# Patient Record
Sex: Female | Born: 1950 | Race: Black or African American | Hispanic: No | Marital: Single | Smoking: Current every day smoker
Health system: Southern US, Community
[De-identification: ages and names within clinical notes are randomized; demographics above are authoritative.]

## PROBLEM LIST (undated history)

## (undated) DIAGNOSIS — F32A Depression, unspecified: Secondary | ICD-10-CM

## (undated) DIAGNOSIS — M545 Low back pain, unspecified: Secondary | ICD-10-CM

## (undated) DIAGNOSIS — F319 Bipolar disorder, unspecified: Secondary | ICD-10-CM

## (undated) DIAGNOSIS — H269 Unspecified cataract: Secondary | ICD-10-CM

## (undated) DIAGNOSIS — G4730 Sleep apnea, unspecified: Secondary | ICD-10-CM

## (undated) DIAGNOSIS — J189 Pneumonia, unspecified organism: Secondary | ICD-10-CM

## (undated) DIAGNOSIS — F329 Major depressive disorder, single episode, unspecified: Secondary | ICD-10-CM

## (undated) DIAGNOSIS — M810 Age-related osteoporosis without current pathological fracture: Secondary | ICD-10-CM

## (undated) DIAGNOSIS — J45909 Unspecified asthma, uncomplicated: Secondary | ICD-10-CM

## (undated) DIAGNOSIS — J449 Chronic obstructive pulmonary disease, unspecified: Secondary | ICD-10-CM

## (undated) DIAGNOSIS — E7800 Pure hypercholesterolemia, unspecified: Secondary | ICD-10-CM

## (undated) DIAGNOSIS — F419 Anxiety disorder, unspecified: Secondary | ICD-10-CM

## (undated) DIAGNOSIS — G4733 Obstructive sleep apnea (adult) (pediatric): Secondary | ICD-10-CM

## (undated) DIAGNOSIS — G8929 Other chronic pain: Secondary | ICD-10-CM

## (undated) DIAGNOSIS — T7840XA Allergy, unspecified, initial encounter: Secondary | ICD-10-CM

## (undated) DIAGNOSIS — M1990 Unspecified osteoarthritis, unspecified site: Secondary | ICD-10-CM

## (undated) DIAGNOSIS — K5909 Other constipation: Secondary | ICD-10-CM

## (undated) DIAGNOSIS — E119 Type 2 diabetes mellitus without complications: Secondary | ICD-10-CM

## (undated) DIAGNOSIS — K589 Irritable bowel syndrome without diarrhea: Secondary | ICD-10-CM

## (undated) HISTORY — DX: Major depressive disorder, single episode, unspecified: F32.9

## (undated) HISTORY — PX: COLONOSCOPY: SHX174

## (undated) HISTORY — PX: BACK SURGERY: SHX140

## (undated) HISTORY — PX: CARPAL TUNNEL RELEASE: SHX101

## (undated) HISTORY — DX: Age-related osteoporosis without current pathological fracture: M81.0

## (undated) HISTORY — DX: Sleep apnea, unspecified: G47.30

## (undated) HISTORY — DX: Depression, unspecified: F32.A

## (undated) HISTORY — DX: Irritable bowel syndrome, unspecified: K58.9

## (undated) HISTORY — DX: Unspecified asthma, uncomplicated: J45.909

## (undated) HISTORY — DX: Unspecified cataract: H26.9

## (undated) HISTORY — PX: CHOLECYSTECTOMY: SHX55

## (undated) HISTORY — DX: Allergy, unspecified, initial encounter: T78.40XA

## (undated) HISTORY — DX: Other constipation: K59.09

## (undated) HISTORY — PX: POSTERIOR LUMBAR FUSION: SHX6036

---

## 1976-04-29 HISTORY — PX: TUBAL LIGATION: SHX77

## 1980-04-29 HISTORY — PX: ABDOMINAL HYSTERECTOMY: SHX81

## 1988-12-28 HISTORY — PX: CHOLECYSTECTOMY: SHX55

## 1997-04-29 HISTORY — PX: FOREARM FRACTURE SURGERY: SHX649

## 1998-04-29 HISTORY — PX: SHOULDER OPEN ROTATOR CUFF REPAIR: SHX2407

## 1998-12-29 HISTORY — PX: POSTERIOR LUMBAR FUSION: SHX6036

## 2011-04-30 HISTORY — PX: VAGAL NERVE STIMULATOR REMOVAL: SUR1110

## 2011-04-30 HISTORY — PX: IMPLANTATION VAGAL NERVE STIMULATOR: SUR692

## 2012-03-29 ENCOUNTER — Emergency Department (HOSPITAL_COMMUNITY): Payer: Medicare Other

## 2012-03-29 ENCOUNTER — Encounter (HOSPITAL_COMMUNITY): Payer: Self-pay | Admitting: *Deleted

## 2012-03-29 ENCOUNTER — Emergency Department (HOSPITAL_COMMUNITY)
Admission: EM | Admit: 2012-03-29 | Discharge: 2012-03-29 | Disposition: A | Discharge status: Home / Self Care | Payer: Medicare Other | Attending: Emergency Medicine | Admitting: Emergency Medicine

## 2012-03-29 DIAGNOSIS — F17200 Nicotine dependence, unspecified, uncomplicated: Secondary | ICD-10-CM | POA: Insufficient documentation

## 2012-03-29 DIAGNOSIS — Z79899 Other long term (current) drug therapy: Secondary | ICD-10-CM | POA: Insufficient documentation

## 2012-03-29 DIAGNOSIS — J40 Bronchitis, not specified as acute or chronic: Secondary | ICD-10-CM | POA: Insufficient documentation

## 2012-03-29 DIAGNOSIS — R059 Cough, unspecified: Secondary | ICD-10-CM | POA: Insufficient documentation

## 2012-03-29 DIAGNOSIS — R05 Cough: Secondary | ICD-10-CM | POA: Insufficient documentation

## 2012-03-29 DIAGNOSIS — E119 Type 2 diabetes mellitus without complications: Secondary | ICD-10-CM | POA: Insufficient documentation

## 2012-03-29 DIAGNOSIS — R5383 Other fatigue: Secondary | ICD-10-CM | POA: Insufficient documentation

## 2012-03-29 DIAGNOSIS — R5381 Other malaise: Secondary | ICD-10-CM | POA: Insufficient documentation

## 2012-03-29 DIAGNOSIS — Z794 Long term (current) use of insulin: Secondary | ICD-10-CM | POA: Insufficient documentation

## 2012-03-29 MED ORDER — ALBUTEROL SULFATE HFA 108 (90 BASE) MCG/ACT IN AERS
2.0000 | INHALATION_SPRAY | RESPIRATORY_TRACT | Status: DC | PRN
  Administered 2012-03-29: 2 via RESPIRATORY_TRACT
  Filled 2012-03-29: qty 6.7

## 2012-03-29 NOTE — ED Provider Notes (Signed)
History   This chart was scribed for Ethelda Chick, MD by Melba Coon, ED Scribe. The patient was seen in room TR11C/TR11C and the patient's care was started at 6:18PM.    CSN: [id number]  Arrival date & time 03/29/12  1619   None     Chief Complaint  Patient presents with  . URI    (Consider location/radiation/quality/duration/timing/severity/associated sxs/prior treatment) Patient is a 61 y.o. female presenting with URI. The history is provided by the patient. No language interpreter was used.  URI The primary symptoms include fatigue and cough. Primary symptoms do not include fever. Primary symptoms comment: sneezing This is a new problem. The problem has been gradually worsening.  Symptoms associated with the illness include congestion.  She reports that family relatives were sick at home in the past week with similar symptoms. Decreased fluid intake compared to baseline. She reports periorbital pain and mouth pain; "hurts around my gums". He denies neck pain, sore throat, rash, back pain, CP, SOB, abdominal pain, nausea, emesis, diarrhea, dysuria, or extremity pain, edema, weakness, numbness, or tingling. No other pertinent medical symptoms. She is a current everyday smoker.   Past Medical History  Diagnosis Date  . Diabetes mellitus without complication     Past Surgical History  Procedure Date  . Back surgery     History reviewed. No pertinent family history.  History  Substance Use Topics  . Smoking status: Current Every Day Smoker    Types: Cigarettes  . Smokeless tobacco: Not on file  . Alcohol Use: No    OB History    Grav Para Term Preterm Abortions TAB SAB Ect Mult Living                  Review of Systems  Constitutional: Positive for fatigue. Negative for fever.  HENT: Positive for congestion.   Respiratory: Positive for cough.    10 Systems reviewed and all are negative for acute change except as noted in the HPI.   Allergies   Latex  Home Medications   Current Outpatient Rx  Name  Route  Sig  Dispense  Refill  . ATORVASTATIN CALCIUM 40 MG PO TABS   Oral   Take 40 mg by mouth daily.         . FUROSEMIDE 20 MG PO TABS   Oral   Take 20 mg by mouth daily.         Marland Kitchen GABAPENTIN 300 MG PO CAPS   Oral   Take 300 mg by mouth 2 (two) times daily.         . INSULIN GLARGINE 100 UNIT/ML [place] SOLN   Subcutaneous   Inject 12 Units into the skin at bedtime.         Marland Kitchen LAMOTRIGINE 150 MG PO TABS   Oral   Take 150 mg by mouth 2 (two) times daily.         Marland Kitchen MECLIZINE HCL 12.5 MG PO TABS   Oral   Take 12.5 mg by mouth 3 (three) times daily as needed. For dizziness         . MORPHINE SULFATE 30 MG PO TABS   Oral   Take 30 mg by mouth every 12 (twelve) hours.         Marland Kitchen POTASSIUM CHLORIDE CRYS ER 20 MEQ PO TBCR   Oral   Take 20 mEq by mouth daily.           BP 112/71  Pulse 88  Temp 98.1 F (36.7 C) (Oral)  Resp 20  SpO2 97%  Physical Exam  Nursing note and vitals reviewed. Constitutional:       Awake, alert, nontoxic appearance. She is physically shivering with chills.  HENT:  Head: Atraumatic.       Nasal congestion and rhinnorhea.  Eyes: EOM are normal. Pupils are equal, round, and reactive to light. Right eye exhibits no discharge. Left eye exhibits no discharge.       Mild conjunctivitis bilaterally.  Neck: Normal range of motion. Neck supple.  Cardiovascular: Normal rate, regular rhythm and normal heart sounds.   No murmur heard. Pulmonary/Chest: Effort normal and breath sounds normal. She exhibits no tenderness.       Frequent coughing, but lung sounds are clear.  Abdominal: Soft. There is no tenderness. There is no rebound.  Musculoskeletal: She exhibits no tenderness.       Baseline ROM, no obvious new focal weakness.  Neurological:       Mental status and motor strength appears baseline for patient and situation.  Skin: Skin is warm. No rash noted.  Psychiatric: She has  a normal mood and affect.    ED Course  Procedures (including critical care time)  COORDINATION OF CARE:  6:21PM - CXR and albuterol will be ordered for Melanie Reynolds.    Labs Reviewed - No data to display Dg Chest 2 View  03/29/2012  *RADIOLOGY REPORT*  Clinical Data: Shortness of breath, chest pain, and cough  CHEST - 2 VIEW  Comparison: None.  Findings: Heart, mediastinal, and hilar contours are normal. Pulmonary vascularity is normal.  There is slight peribronchial thickening bilaterally.  No airspace opacities, effusion, or pneumothorax.  Trachea is midline.  Bones are unremarkable.  IMPRESSION: Mild peribronchial thickening.  This can be seen in the setting of bronchitis, smoking, or asthma.   Original Report Authenticated By: Britta Mccreedy, M.D.      1. Bronchitis       MDM  Pt presenting with c/o nasal congestion, cough and sneezing.  Symptoms have been present over the past 2 days.  CXR shows no pneumonia- signs most c/w bronchitis.  Pt given albuterol inhaler for bronchitis.  Discharged with strict return precautions.  Pt agreeable with plan.  I personally performed the services described in this documentation, which was scribed in my presence. The recorded information has been reviewed and is accurate.        Ethelda Chick, MD 03/29/12 [phone]

## 2012-03-29 NOTE — ED Notes (Signed)
Pt reports cold symptoms since yesterday, having cough, congestion, sneezing. Airway intact, no distress noted.

## 2012-11-09 ENCOUNTER — Other Ambulatory Visit (HOSPITAL_COMMUNITY): Payer: Self-pay | Admitting: Internal Medicine

## 2012-11-09 DIAGNOSIS — Z1231 Encounter for screening mammogram for malignant neoplasm of breast: Secondary | ICD-10-CM

## 2012-11-20 ENCOUNTER — Ambulatory Visit (HOSPITAL_COMMUNITY): Payer: Medicare Other

## 2012-12-03 ENCOUNTER — Ambulatory Visit (HOSPITAL_COMMUNITY)
Admission: RE | Admit: 2012-12-03 | Discharge: 2012-12-03 | Disposition: A | Discharge status: Home / Self Care | Payer: Medicare Other | Source: Ambulatory Visit | Attending: Internal Medicine | Admitting: Internal Medicine

## 2012-12-03 DIAGNOSIS — Z1231 Encounter for screening mammogram for malignant neoplasm of breast: Secondary | ICD-10-CM | POA: Insufficient documentation

## 2013-03-05 ENCOUNTER — Ambulatory Visit (HOSPITAL_COMMUNITY)
Admission: RE | Admit: 2013-03-05 | Discharge: 2013-03-05 | Disposition: A | Discharge status: Home / Self Care | Payer: Medicare Other | Source: Ambulatory Visit | Attending: Internal Medicine | Admitting: Internal Medicine

## 2013-03-05 ENCOUNTER — Other Ambulatory Visit (HOSPITAL_COMMUNITY): Payer: Self-pay | Admitting: Internal Medicine

## 2013-03-05 DIAGNOSIS — J209 Acute bronchitis, unspecified: Secondary | ICD-10-CM

## 2013-03-05 DIAGNOSIS — R05 Cough: Secondary | ICD-10-CM | POA: Insufficient documentation

## 2013-03-05 DIAGNOSIS — R059 Cough, unspecified: Secondary | ICD-10-CM | POA: Insufficient documentation

## 2013-06-01 ENCOUNTER — Other Ambulatory Visit: Payer: Self-pay

## 2013-06-01 ENCOUNTER — Inpatient Hospital Stay (HOSPITAL_COMMUNITY)
Admission: EM | Admit: 2013-06-01 | Discharge: 2013-06-03 | DRG: 194 | Disposition: A | Discharge status: Home / Self Care | Payer: Medicare Other | Attending: Internal Medicine | Admitting: Internal Medicine

## 2013-06-01 ENCOUNTER — Encounter (HOSPITAL_COMMUNITY): Payer: Self-pay | Admitting: Emergency Medicine

## 2013-06-01 ENCOUNTER — Emergency Department (HOSPITAL_COMMUNITY): Payer: Medicare Other

## 2013-06-01 DIAGNOSIS — E119 Type 2 diabetes mellitus without complications: Secondary | ICD-10-CM

## 2013-06-01 DIAGNOSIS — R0789 Other chest pain: Secondary | ICD-10-CM | POA: Diagnosis present

## 2013-06-01 DIAGNOSIS — J189 Pneumonia, unspecified organism: Principal | ICD-10-CM | POA: Diagnosis present

## 2013-06-01 DIAGNOSIS — E785 Hyperlipidemia, unspecified: Secondary | ICD-10-CM | POA: Diagnosis present

## 2013-06-01 DIAGNOSIS — F319 Bipolar disorder, unspecified: Secondary | ICD-10-CM

## 2013-06-01 DIAGNOSIS — F17200 Nicotine dependence, unspecified, uncomplicated: Secondary | ICD-10-CM | POA: Diagnosis present

## 2013-06-01 DIAGNOSIS — I959 Hypotension, unspecified: Secondary | ICD-10-CM

## 2013-06-01 DIAGNOSIS — J9819 Other pulmonary collapse: Secondary | ICD-10-CM | POA: Diagnosis present

## 2013-06-01 DIAGNOSIS — IMO0001 Reserved for inherently not codable concepts without codable children: Secondary | ICD-10-CM | POA: Diagnosis present

## 2013-06-01 DIAGNOSIS — F1210 Cannabis abuse, uncomplicated: Secondary | ICD-10-CM | POA: Diagnosis present

## 2013-06-01 DIAGNOSIS — D72829 Elevated white blood cell count, unspecified: Secondary | ICD-10-CM

## 2013-06-01 DIAGNOSIS — R51 Headache: Secondary | ICD-10-CM | POA: Diagnosis present

## 2013-06-01 DIAGNOSIS — G8929 Other chronic pain: Secondary | ICD-10-CM

## 2013-06-01 DIAGNOSIS — E876 Hypokalemia: Secondary | ICD-10-CM

## 2013-06-01 HISTORY — DX: Low back pain, unspecified: M54.50

## 2013-06-01 HISTORY — DX: Low back pain: M54.5

## 2013-06-01 HISTORY — DX: Obstructive sleep apnea (adult) (pediatric): G47.33

## 2013-06-01 HISTORY — DX: Type 2 diabetes mellitus without complications: E11.9

## 2013-06-01 HISTORY — DX: Pneumonia, unspecified organism: J18.9

## 2013-06-01 HISTORY — DX: Pure hypercholesterolemia, unspecified: E78.00

## 2013-06-01 HISTORY — DX: Anxiety disorder, unspecified: F41.9

## 2013-06-01 HISTORY — DX: Bipolar disorder, unspecified: F31.9

## 2013-06-01 HISTORY — DX: Other chronic pain: G89.29

## 2013-06-01 HISTORY — DX: Unspecified osteoarthritis, unspecified site: M19.90

## 2013-06-01 LAB — HIV ANTIBODY (ROUTINE TESTING W REFLEX): HIV: NONREACTIVE

## 2013-06-01 LAB — GLUCOSE, CAPILLARY
Glucose-Capillary: 91 mg/dL (ref 70–99)
Glucose-Capillary: 115 mg/dL — ABNORMAL HIGH (ref 70–99)
Glucose-Capillary: 114 mg/dL — ABNORMAL HIGH (ref 70–99)

## 2013-06-01 LAB — CBC
HCT: 43.2 % (ref 36.0–46.0)
HEMATOCRIT: 38.3 % (ref 36.0–46.0)
HEMOGLOBIN: 12.4 g/dL (ref 12.0–15.0)
Hemoglobin: 14.5 g/dL (ref 12.0–15.0)
MCH: 29.8 pg (ref 26.0–34.0)
MCH: 28.6 pg (ref 26.0–34.0)
MCHC: 33.6 g/dL (ref 30.0–36.0)
MCHC: 32.4 g/dL (ref 30.0–36.0)
MCV: 88.7 fL (ref 78.0–100.0)
MCV: 88.2 fL (ref 78.0–100.0)
PLATELETS: 286 10*3/uL (ref 150–400)
Platelets: 274 10*3/uL (ref 150–400)
RBC: 4.87 MIL/uL (ref 3.87–5.11)
RBC: 4.34 MIL/uL (ref 3.87–5.11)
RDW: 14 % (ref 11.5–15.5)
RDW: 13.8 % (ref 11.5–15.5)
WBC: 10.9 10*3/uL — AB (ref 4.0–10.5)
WBC: 8.7 10*3/uL (ref 4.0–10.5)

## 2013-06-01 LAB — BASIC METABOLIC PANEL
BUN: 11 mg/dL (ref 6–23)
CHLORIDE: 96 meq/L (ref 96–112)
CO2: 23 meq/L (ref 19–32)
CREATININE: 0.84 mg/dL (ref 0.50–1.10)
Calcium: 9.2 mg/dL (ref 8.4–10.5)
GFR calc Af Amer: 85 mL/min — ABNORMAL LOW (ref >90)
GFR calc non Af Amer: 74 mL/min — ABNORMAL LOW (ref >90)
Glucose, Bld: 128 mg/dL — ABNORMAL HIGH (ref 70–99)
POTASSIUM: 3.6 meq/L — AB (ref 3.7–5.3)
SODIUM: 136 meq/L — AB (ref 137–147)

## 2013-06-01 LAB — CREATININE, SERUM
Creatinine, Ser: 0.68 mg/dL (ref 0.50–1.10)
GFR calc Af Amer: >90 mL/min (ref >90)
GFR calc non Af Amer: >90 mL/min (ref >90)

## 2013-06-01 LAB — LIPID PANEL
CHOLESTEROL: 150 mg/dL (ref 0–200)
HDL: 44 mg/dL (ref >39)
LDL Cholesterol: 88 mg/dL (ref 0–99)
Total CHOL/HDL Ratio: 3.4 RATIO
Triglycerides: 90 mg/dL (ref <150)
VLDL: 18 mg/dL (ref 0–40)

## 2013-06-01 LAB — HEMOGLOBIN A1C
HEMOGLOBIN A1C: 6.4 % — AB (ref <5.7)
MEAN PLASMA GLUCOSE: 137 mg/dL — AB (ref <117)

## 2013-06-01 LAB — TROPONIN I: Troponin I: <0.3 ng/mL (ref <0.30)

## 2013-06-01 LAB — POCT I-STAT TROPONIN I: TROPONIN I, POC: 0 ng/mL (ref 0.00–0.08)

## 2013-06-01 LAB — INFLUENZA PANEL BY PCR (TYPE A & B)
H1N1 flu by pcr: NOT DETECTED
INFLAPCR: NEGATIVE
INFLBPCR: NEGATIVE

## 2013-06-01 LAB — MAGNESIUM: MAGNESIUM: 2 mg/dL (ref 1.5–2.5)

## 2013-06-01 LAB — CG4 I-STAT (LACTIC ACID): Lactic Acid, Venous: 1.05 mmol/L (ref 0.5–2.2)

## 2013-06-01 LAB — PHOSPHORUS: PHOSPHORUS: 2.4 mg/dL (ref 2.3–4.6)

## 2013-06-01 LAB — STREP PNEUMONIAE URINARY ANTIGEN: Strep Pneumo Urinary Antigen: NEGATIVE

## 2013-06-01 LAB — PRO B NATRIURETIC PEPTIDE: PRO B NATRI PEPTIDE: 29.4 pg/mL (ref 0–125)

## 2013-06-01 LAB — TSH: TSH: 1.36 u[IU]/mL (ref 0.350–4.500)

## 2013-06-01 MED ORDER — SIMVASTATIN 10 MG PO TABS
10.0000 mg | ORAL_TABLET | Freq: Every day | ORAL | Status: DC
  Administered 2013-06-01 – 2013-06-02 (×2): 10 mg via ORAL
  Filled 2013-06-01 (×3): qty 1

## 2013-06-01 MED ORDER — DEXTROSE 5 % IV SOLN
500.0000 mg | INTRAVENOUS | Status: DC
  Administered 2013-06-01 – 2013-06-03 (×3): 500 mg via INTRAVENOUS
  Filled 2013-06-01 (×2): qty 500

## 2013-06-01 MED ORDER — DEXTROSE 5 % IV SOLN
1.0000 g | Freq: Once | INTRAVENOUS | Status: AC
  Administered 2013-06-01: 1 g via INTRAVENOUS
  Filled 2013-06-01: qty 10

## 2013-06-01 MED ORDER — DEXTROSE 5 % IV SOLN
1.0000 g | INTRAVENOUS | Status: DC
  Administered 2013-06-01 – 2013-06-03 (×3): 1 g via INTRAVENOUS
  Filled 2013-06-01 (×3): qty 10

## 2013-06-01 MED ORDER — MORPHINE SULFATE 4 MG/ML IJ SOLN
4.0000 mg | Freq: Once | INTRAMUSCULAR | Status: DC
  Filled 2013-06-01: qty 1

## 2013-06-01 MED ORDER — POTASSIUM CHLORIDE CRYS ER 20 MEQ PO TBCR
20.0000 meq | EXTENDED_RELEASE_TABLET | Freq: Every day | ORAL | Status: DC
  Administered 2013-06-01 – 2013-06-03 (×3): 20 meq via ORAL
  Filled 2013-06-01 (×3): qty 1

## 2013-06-01 MED ORDER — SODIUM CHLORIDE 0.9 % IV BOLUS (SEPSIS)
1000.0000 mL | Freq: Once | INTRAVENOUS | Status: AC
  Administered 2013-06-01: 1000 mL via INTRAVENOUS

## 2013-06-01 MED ORDER — GABAPENTIN 300 MG PO CAPS
300.0000 mg | ORAL_CAPSULE | Freq: Two times a day (BID) | ORAL | Status: DC
  Administered 2013-06-01 – 2013-06-03 (×5): 300 mg via ORAL
  Filled 2013-06-01 (×6): qty 1

## 2013-06-01 MED ORDER — DM-GUAIFENESIN ER 30-600 MG PO TB12
1.0000 | ORAL_TABLET | Freq: Two times a day (BID) | ORAL | Status: DC
Start: 2013-06-01 — End: 2013-06-03
  Administered 2013-06-01 – 2013-06-03 (×5): 1 via ORAL
  Filled 2013-06-01 (×6): qty 1

## 2013-06-01 MED ORDER — INSULIN ASPART 100 UNIT/ML ~~LOC~~ SOLN
0.0000 [IU] | Freq: Three times a day (TID) | SUBCUTANEOUS | Status: DC
  Administered 2013-06-03: 2 [IU] via SUBCUTANEOUS

## 2013-06-01 MED ORDER — ALBUTEROL SULFATE (2.5 MG/3ML) 0.083% IN NEBU: 2.5000 mg | INHALATION_SOLUTION | RESPIRATORY_TRACT | Status: DC | PRN

## 2013-06-01 MED ORDER — AZITHROMYCIN 250 MG PO TABS: 250.0000 mg | ORAL_TABLET | Freq: Every day | ORAL | Status: DC

## 2013-06-01 MED ORDER — BENZONATATE 100 MG PO CAPS
100.0000 mg | ORAL_CAPSULE | Freq: Three times a day (TID) | ORAL | Status: DC | PRN
  Administered 2013-06-01: 100 mg via ORAL
  Filled 2013-06-01 (×2): qty 1

## 2013-06-01 MED ORDER — INSULIN DETEMIR 100 UNIT/ML ~~LOC~~ SOLN
12.0000 [IU] | Freq: Every day | SUBCUTANEOUS | Status: DC
  Administered 2013-06-01 – 2013-06-02 (×2): 12 [IU] via SUBCUTANEOUS
  Filled 2013-06-01 (×3): qty 0.12

## 2013-06-01 MED ORDER — ALBUTEROL SULFATE HFA 108 (90 BASE) MCG/ACT IN AERS
2.0000 | INHALATION_SPRAY | RESPIRATORY_TRACT | Status: DC | PRN
  Administered 2013-06-01: 2 via RESPIRATORY_TRACT
  Filled 2013-06-01: qty 6.7

## 2013-06-01 MED ORDER — AZITHROMYCIN 250 MG PO TABS
500.0000 mg | ORAL_TABLET | Freq: Once | ORAL | Status: AC
  Administered 2013-06-01: 500 mg via ORAL
  Filled 2013-06-01 (×2): qty 2

## 2013-06-01 MED ORDER — ENOXAPARIN SODIUM 40 MG/0.4ML ~~LOC~~ SOLN
40.0000 mg | SUBCUTANEOUS | Status: DC
  Administered 2013-06-01 – 2013-06-03 (×2): 40 mg via SUBCUTANEOUS
  Filled 2013-06-01 (×3): qty 0.4

## 2013-06-01 MED ORDER — SODIUM CHLORIDE 0.9 % IV SOLN
INTRAVENOUS | Status: AC
  Administered 2013-06-01: 10:00:00 via INTRAVENOUS

## 2013-06-01 MED ORDER — IPRATROPIUM-ALBUTEROL 0.5-2.5 (3) MG/3ML IN SOLN
3.0000 mL | RESPIRATORY_TRACT | Status: DC
  Administered 2013-06-01 – 2013-06-02 (×7): 3 mL via RESPIRATORY_TRACT
  Filled 2013-06-01 (×7): qty 3

## 2013-06-01 MED ORDER — OSELTAMIVIR PHOSPHATE 75 MG PO CAPS
75.0000 mg | ORAL_CAPSULE | Freq: Two times a day (BID) | ORAL | Status: DC
  Administered 2013-06-01 – 2013-06-02 (×3): 75 mg via ORAL
  Filled 2013-06-01 (×7): qty 1

## 2013-06-01 MED ORDER — TRAZODONE HCL 50 MG PO TABS
50.0000 mg | ORAL_TABLET | Freq: Every day | ORAL | Status: DC
  Administered 2013-06-01 – 2013-06-03 (×3): 50 mg via ORAL
  Filled 2013-06-01 (×3): qty 1

## 2013-06-01 MED ORDER — MECLIZINE HCL 12.5 MG PO TABS
12.5000 mg | ORAL_TABLET | Freq: Three times a day (TID) | ORAL | Status: DC | PRN
  Administered 2013-06-01: 12.5 mg via ORAL
  Filled 2013-06-01 (×2): qty 1

## 2013-06-01 MED ORDER — NICOTINE 21 MG/24HR TD PT24
21.0000 mg | MEDICATED_PATCH | Freq: Every day | TRANSDERMAL | Status: DC
  Administered 2013-06-01 – 2013-06-02 (×2): 21 mg via TRANSDERMAL
  Filled 2013-06-01 (×3): qty 1

## 2013-06-01 MED ORDER — MORPHINE SULFATE 15 MG PO TABS
30.0000 mg | ORAL_TABLET | Freq: Two times a day (BID) | ORAL | Status: DC
  Administered 2013-06-01 – 2013-06-03 (×5): 30 mg via ORAL
  Filled 2013-06-01 (×5): qty 2

## 2013-06-01 MED ORDER — LITHIUM CARBONATE ER 300 MG PO TBCR
300.0000 mg | EXTENDED_RELEASE_TABLET | Freq: Two times a day (BID) | ORAL | Status: DC
  Administered 2013-06-01 – 2013-06-03 (×5): 300 mg via ORAL
  Filled 2013-06-01 (×6): qty 1

## 2013-06-01 MED ORDER — FLUTICASONE PROPIONATE 50 MCG/ACT NA SUSP
2.0000 | Freq: Every day | NASAL | Status: DC
  Administered 2013-06-01 – 2013-06-03 (×3): 2 via NASAL
  Filled 2013-06-01: qty 16

## 2013-06-01 NOTE — Progress Notes (Signed)
Utilization review completed.  

## 2013-06-01 NOTE — ED Provider Notes (Signed)
CSN: [id number]     Arrival date & time 06/01/13  0200 History   First MD Initiated Contact with Patient 06/01/13 0309     Chief Complaint  Patient presents with  . Chest Pain   (Consider location/radiation/quality/duration/timing/severity/associated sxs/prior Treatment) HPI Pt presents with c/o cough associated with mid anterior chest pain.  Pt states she has had cough for several weeks, has become productive.  She has also felt listless and has had decreased appetite.  Worse today.  Had subjective fever this morning.  This evening after coughing she developed anterior midsternal burning chest pain and soreness. No radiation of pain, no nausea or diaphoresis.  No leg swelling.  No recent travel/trauma/surgery, no hx of DVT/PE.  No vomiting.  States she was on amoxicillin due to OM for a cold several weeks ago.  No fainting or lightheadedness.  There are no other associated systemic symptoms, there are no other alleviating or modifying factors.   Past Medical History  Diagnosis Date  . Diabetes mellitus without complication    Past Surgical History  Procedure Laterality Date  . Back surgery    . Cholecystectomy    . Abdominal hysterectomy     No family history on file. History  Substance Use Topics  . Smoking status: Current Every Day Smoker    Types: Cigarettes  . Smokeless tobacco: Not on file  . Alcohol Use: No   OB History   Grav Para Term Preterm Abortions TAB SAB Ect Mult Living                 Review of Systems ROS reviewed and all otherwise negative except for mentioned in HPI  Allergies  Latex  Home Medications   Current Outpatient Rx  Name  Route  Sig  Dispense  Refill  . gabapentin (NEURONTIN) 300 MG capsule   Oral   Take 300 mg by mouth 2 (two) times daily.         Marland Kitchen LEVEMIR FLEXTOUCH 100 UNIT/ML Pen   Subcutaneous   Inject 12 Units into the skin at bedtime.          Marland Kitchen lithium carbonate (LITHOBID) 300 MG CR tablet   Oral   Take 1 tablet by mouth 2  (two) times daily.         . meclizine (ANTIVERT) 12.5 MG tablet   Oral   Take 12.5 mg by mouth 3 (three) times daily as needed. For dizziness         . morphine (MSIR) 30 MG tablet   Oral   Take 30 mg by mouth every 12 (twelve) hours.         . potassium chloride SA (K-DUR,KLOR-CON) 20 MEQ tablet   Oral   Take 20 mEq by mouth daily.         . simvastatin (ZOCOR) 10 MG tablet   Oral   Take 1 tablet by mouth daily.         . traZODone (DESYREL) 50 MG tablet   Oral   Take 1 tablet by mouth daily.         Marland Kitchen azithromycin (ZITHROMAX Z-PAK) 250 MG tablet   Oral   Take 1 tablet (250 mg total) by mouth daily.   4 tablet   0    BP 99/63  Pulse 98  Temp(Src) 98.3 F (36.8 C) (Oral)  Resp 18  Ht 5\' 3"  (1.6 m)  Wt 223 lb (101.152 kg)  BMI 39.51 kg/m2  SpO2 98% Vitals reviewed  Physical Exam Physical Examination: General appearance - alert, well appearing, and in no distress Mental status - alert, oriented to person, place, and time Eyes - no conjunctival injection, no scleral icterus Mouth - mucous membranes moist, pharynx normal without lesions Chest - clear to auscultation, no wheezes, rales or rhonchi, symmetric air entry Heart - normal rate, regular rhythm, normal S1, S2, no murmurs, rubs, clicks or gallops Abdomen - soft, nontender, nondistended, no masses or organomegaly Extremities - peripheral pulses normal, no pedal edema, no clubbing or cyanosis Skin - normal coloration and turgor, no rashes  ED Course  Procedures (including critical care time)   Date: 06/01/2013  Rate: 95  Rhythm: normal sinus rhythm  QRS Axis: normal  Intervals: normal  ST/T Wave abnormalities: normal  Conduction Disutrbances: none  Narrative Interpretation: unremarkable   EKG not available in Epic for interpretation in MUSE  7:15 AM pt with continued hypotension after antibiotics and fluids.  Lactate is not elevated.  Due to continued hypotension, d/w [facility] for admission.   Pt is agreeable with this plan.   Labs Review Labs Reviewed  CBC - Abnormal; Notable for the following:    WBC 10.9 (*)    All other components within normal limits  BASIC METABOLIC PANEL - Abnormal; Notable for the following:    Sodium 136 (*)    Potassium 3.6 (*)    Glucose, Bld 128 (*)    GFR calc non Af Amer 74 (*)    GFR calc Af Amer 85 (*)    All other components within normal limits  PRO B NATRIURETIC PEPTIDE  INFLUENZA PANEL BY PCR (TYPE A & B, H1N1)  POCT I-STAT TROPONIN I  CG4 I-STAT (LACTIC ACID)   Imaging Review Dg Chest 2 View  06/01/2013   CLINICAL DATA:  Cough and chest pain  EXAM: CHEST  2 VIEW  COMPARISON:  03/05/2013  FINDINGS: There is an indistinct opacity at the left base, likely in the lower lobe when correlated with the lateral view. No evidence of effusion or pneumothorax. Interstitial coarsening which is likely chronic and bronchitic. No cardiomegaly. No effusion or pneumothorax. Cholecystectomy changes.  IMPRESSION: Small indistinct opacity at the left base which could represent atelectasis or early infectious infiltrate.   Electronically Signed   By: Jorje Guild M.D.   On: 06/01/2013 02:39    EKG Interpretation   None       MDM   1. Community acquired pneumonia   2. Leukocytosis   3. Hypotension    Pt presenting with c/o chest pain with coughing and decreased energy level.  Pt was started on IV abx for CAP.  However after 2L fluid bolus she continues to have hypotension.  She feels dizzy upon standing.  For this reason patient admitted to hospitalist service for further management.  Will also send influenza panel.  Doubt PE given her symptoms of cough/cold symptoms. No tachycardia or hypoxia.   Threasa Beards, MD 06/01/13 2352

## 2013-06-01 NOTE — H&P (Signed)
[facility] History and Physical  Saleemah Devenney [id number] DOB: Aug 24, 1950 DOA: 06/01/2013  Referring physician:  PCP: No primary provider on file.  Specialists:   Chief Complaint: Cough and congestion with some chest pain  HPI: Melanie Reynolds is a 63 y.o. female  With a history of diabetes mellitus, bipolar disorder, recent bronchitis in December 2014 that presents emergency department with complaints of cough, congestion, myalgias and some chest pain. Patient states the symptoms have been ongoing for approximately one and a half months. She did see her primary care physician before Christmastime and was placed on some antibiotic however does not remove the name of the antibiotic. She did have some of her symptoms resolved however they returned. Patient does have an 19 year old granddaughter at home with similar symptoms and presumably the flu. Patient also states that she had a fever at home of approximately 101F. She feels that her symptoms have been worsening since Sunday. Patient does complain of a productive cough with green sputum production. She denies any recent travel. Patient also complains of chest pain which he says is midsternal it does not radiate anywhere. She does feel this is likely secondary to her coughing.  Review of Systems:  Constitutional: Patient complains of fever and chills with decreased appetite.  HEENT: Patient complains of cough with congestion. Respiratory: Patient complains of cough productive. Cardiovascular: Patient does complain of chest pain, midsternal with no associated symptoms. Gastrointestinal: Denies nausea, vomiting, abdominal pain, diarrhea, constipation, blood in stool and abdominal distention.  Genitourinary: Denies dysuria, urgency, frequency, hematuria, flank pain and difficulty urinating.  Musculoskeletal: Complains of myalgias. Skin: Denies pallor, rash and wound.  Neurological: Denies dizziness, seizures, syncope, weakness,  light-headedness, numbness and headaches.  Hematological: Denies adenopathy. Easy bruising, personal or family bleeding history  Psychiatric/Behavioral: Denies suicidal ideation, mood changes, confusion, nervousness, sleep disturbance and agitation  Past Medical History  Diagnosis Date  . Diabetes mellitus without complication    Past Surgical History  Procedure Laterality Date  . Back surgery    . Cholecystectomy    . Abdominal hysterectomy     Social History:  reports that she has been smoking Cigarettes.  She has been smoking about 0.00 packs per day. She does not have any smokeless tobacco history on file. She reports that she uses illicit drugs (Marijuana). She reports that she does not drink alcohol. Patient currently lives at home, with family. Patient does admit to smoking.  Allergies  Allergen Reactions  . Latex Itching    No family history on file.)  Prior to Admission medications   Medication Sig Start Date End Date Taking? Authorizing Provider  gabapentin (NEURONTIN) 300 MG capsule Take 300 mg by mouth 2 (two) times daily.   Yes Historical Provider, MD  LEVEMIR FLEXTOUCH 100 UNIT/ML Pen Inject 12 Units into the skin at bedtime.  02/22/13  Yes Historical Provider, MD  lithium carbonate (LITHOBID) 300 MG CR tablet Take 1 tablet by mouth 2 (two) times daily. 05/17/13  Yes Historical Provider, MD  meclizine (ANTIVERT) 12.5 MG tablet Take 12.5 mg by mouth 3 (three) times daily as needed. For dizziness   Yes Historical Provider, MD  morphine (MSIR) 30 MG tablet Take 30 mg by mouth every 12 (twelve) hours.   Yes Historical Provider, MD  potassium chloride SA (K-DUR,KLOR-CON) 20 MEQ tablet Take 20 mEq by mouth daily.   Yes Historical Provider, MD  simvastatin (ZOCOR) 10 MG tablet Take 1 tablet by mouth daily. 05/29/13  Yes Historical Provider, MD  traZODone (  DESYREL) 50 MG tablet Take 1 tablet by mouth daily. 05/23/13  Yes Historical Provider, MD  azithromycin (ZITHROMAX Z-PAK) 250  MG tablet Take 1 tablet (250 mg total) by mouth daily. 06/01/13   Threasa Beards, MD   Physical Exam: Filed Vitals:   06/01/13 1130  BP:   Pulse: 98  Temp:   Resp: 19     General: Well developed, well nourished, NAD, appears stated age  HEENT: NCAT, PERRLA, EOMI, Anicteic Sclera, mucous membranes moist. No pharyngeal erythema or exudates   Neck: Supple, no JVD, no masses  Cardiovascular: S1 S2 auscultated, no rubs, murmurs or gallops. Regular rate and rhythm.  Respiratory: Coarse breath sounds noted throughout all lung fields.  Abdomen: Soft, nontender, nondistended, + bowel sounds  Extremities: warm dry without cyanosis clubbing or edema  Neuro: AAOx3, cranial nerves grossly intact. Strength 5/5 in patient's upper and lower extremities bilaterally  Skin: Without rashes exudates or nodules  Psych: Normal affect and demeanor with intact judgement and insight  Labs on Admission:  Basic Metabolic Panel:  Recent Labs Lab 06/01/13 0212 06/01/13 1000  NA 136*  --   K 3.6*  --   CL 96  --   CO2 23  --   GLUCOSE 128*  --   BUN 11  --   CREATININE 0.84 0.68  CALCIUM 9.2  --   MG  --  2.0  PHOS  --  2.4   Liver Function Tests: No results found for this basename: AST, ALT, ALKPHOS, BILITOT, PROT, ALBUMIN,  in the last 168 hours No results found for this basename: LIPASE, AMYLASE,  in the last 168 hours No results found for this basename: AMMONIA,  in the last 168 hours CBC:  Recent Labs Lab 06/01/13 0212 06/01/13 1000  WBC 10.9* 8.7  HGB 14.5 12.4  HCT 43.2 38.3  MCV 88.7 88.2  PLT 286 274   Cardiac Enzymes:  Recent Labs Lab 06/01/13 1000  TROPONINI <0.30    BNP (last 3 results)  Recent Labs  06/01/13 0212  PROBNP 29.4   CBG: No results found for this basename: GLUCAP,  in the last 168 hours  Radiological Exams on Admission: Dg Chest 2 View  06/01/2013   CLINICAL DATA:  Cough and chest pain  EXAM: CHEST  2 VIEW  COMPARISON:  03/05/2013   FINDINGS: There is an indistinct opacity at the left base, likely in the lower lobe when correlated with the lateral view. No evidence of effusion or pneumothorax. Interstitial coarsening which is likely chronic and bronchitic. No cardiomegaly. No effusion or pneumothorax. Cholecystectomy changes.  IMPRESSION: Small indistinct opacity at the left base which could represent atelectasis or early infectious infiltrate.   Electronically Signed   By: Jorje Guild M.D.   On: 06/01/2013 02:39    EKG: Independently reviewed. Sinus rhythm, rate 95, normal axis  Assessment/Plan Active Problems:   CAP (community acquired pneumonia) Patient be admitted to medical floor for telemetry monitoring. She supposedly had an episode of bronchitis over a month ago was treated with outpatient antibiotics and has since developed a fever like illness. Pending influenza screen. Patient did have small opacity noted in the left base on chest x-ray which represent atelectasis or early infectious infiltrate. Will place patient on IV antibiotics with azithromycin and ceftriaxone. Will also cover patient prone for once empirically. If this is negative will discontinue Tamiflu. Will place patient on DuoNeb treatments as well as oxygen to maintain her saturations above 92%. Will place  her on guaifenesin for her cough. Will try to obtain strep pneumonia and Legionella urinary antigens as well as a sputum Gram stain and culture.  Blood cultures have been obtained in the ER and those are pending.    Chest pain, musculoskeletal EKG showed no ischemic changes, troponin is negative. Will continue to monitor troponins every 6 hours. Will also do lipid panel, magnesium, phosphate levels. Patient's chest pain is likely musculoskeletal as it is reproducible with palpation of the chest wall.    Bipolar 1 disorder Will continue patient's lithium.    Hypotension Has resolved with IV fluids in the emergency room. Currently patient is  normotensive. Will place patient on telemetry to monitor for this.    Chronic pain Will continue her home pain regimen of gabapentin, morphine.    Diabetes Will obtain hemoglobin A1c. Continue patient liver marital status rinse incised with CBG monitoring.   Hyperlipidemia Will continue patient on simvastatin as well as obtain fasting lipid panel.  DVT prophylaxis: Lovenox  Code Status: Full  Condition: Guarded  Family Communication: None bedside. Admission, patients condition and plan of care including tests being ordered have been discussed with the patient and she indicates understanding and agrees with the plan and Code Status.  Disposition Plan: Admitted   Time spent: 60 minutes  Elianah Karis D.O. [facility] Pager [phone]  If 7PM-7AM, please contact night-coverage [email or web address] Password [facility] 06/01/2013, 11:43 AM

## 2013-06-01 NOTE — ED Notes (Signed)
Dr. Canary Brim notified that pts BP was 92/50.

## 2013-06-01 NOTE — Discharge Instructions (Signed)
Return to the ED with any concerns including difficulty breathing, vomiting and not able to keep down liquids or antibiotics, decreased level of alertness/lethargy, or any other alarming sympotms  You should use the albuterol inhaler 2 puffs every 4 hours for cough  Emergency Department Resource Guide 1) Find a Doctor and Pay Out of Pocket Although you won't have to find out who is covered by your insurance plan, it is a good idea to ask around and get recommendations. You will then need to call the office and see if the doctor you have chosen will accept you as a new patient and what types of options they offer for patients who are self-pay. Some doctors offer discounts or will set up payment plans for their patients who do not have insurance, but you will need to ask so you aren't surprised when you get to your appointment.  2) Contact Your Local Health Department Not all health departments have doctors that can see patients for sick visits, but many do, so it is worth a call to see if yours does. If you don't know where your local health department is, you can check in your phone book. The CDC also has a tool to help you locate your state's health department, and many state websites also have listings of all of their local health departments.  3) Find a [place] [facility] If your illness is not likely to be very severe or complicated, you may want to try a walk in clinic. These are popping up all over the country in pharmacies, drugstores, and shopping centers. They're usually staffed by nurse practitioners or physician assistants that have been trained to treat common illnesses and complaints. They're usually fairly quick and inexpensive. However, if you have serious medical issues or chronic medical problems, these are probably not your best option.  No Primary Care Doctor: - Call Health Connect at  [phone] - they can help you locate a primary care doctor that  accepts your insurance, provides  certain services, etc. - Physician Referral Service- [phone]  Chronic Pain Problems: Organization         Address  Phone   Notes  [facility]  [phone] Patients need to be referred by their primary care doctor.   Medication Assistance: Organization         Address  Phone   Notes  [facility] Medication [facility] [address] [place], [place] [address] [phone] --Must be a resident of [facility] -- Must have NO insurance coverage whatsoever (no Medicaid/ Medicare, etc.) -- The pt. MUST have a primary care doctor that directs their care regularly and follows them in the community   Med[facility]ist  [phone]   [employer]  [phone]    Agencies that provide inexpensive medical care: Organization         Address  Phone   Notes  [place]  [phone]   [facility]    [phone]   [facility] [place], [place] [address] [phone]   [place] [address] [place] [phone]   Planned Parenthood    [phone]   [facility]    [phone]   [place] and [place] [address], [place] Phone:  [phone], Fax:  [phone] Hours of Operation:  9 am - 6 pm, M-F.  Also accepts  Medicaid/Medicare and self-pay.  [facility] for [place] [address], [address], [place] Phone: [phone], Fax: [phone]. Hours of Operation:  8:30 am - 5:30 pm, M-F.  Also accepts Medicaid and self-pay.  [facility] [place] [address], [place] Phone: [phone]   [place], [place], [place] [phone], Ext. [phone] Mondays & Thursdays: 7-9 AM.  First 15 patients are seen on a first come, first serve basis.    [place] Providers:  Organization         Address  Phone   Notes  [facility] [address],  [phone] Also accepts self-pay patients.  [facility] [id number] [place], [place]  [phone]   [place], [address], [place] [phone]   [facility] [address] [place] [phone]   Lucianne Lei [address], [place]   [phone] Only accepts [place] Access [place] patients after they have their name applied to their card.   Self-Pay (no insurance) in [facility]:  Organization         Address  Phone   Notes  Sickle Cell Patients, [facility] [place] [phone]   [facility] [place] [phone]   [facility] [place]  [place], [place], [place] [phone]   [facility]/Dr. Osei-Bonsu  [address], [place] or [address], [place] [phone] Phone number for both [place] and [place] locations is the same.  Urgent Medical and [facility] [address], [place] [phone]   [facility] [address], [place] or [address] [phone]   [facility] [address], [place] [phone], phone; [phone], fax Sees patients 1st and 3rd Saturday of every month.  Must not qualify for public or private insurance (i.e. Medicaid, Medicare, [place] Health Choice, Veterans' Benefits)  Household income should be no more than 200% of the poverty level The clinic cannot treat you if you are pregnant or think you are pregnant  Sexually transmitted diseases are not treated at the clinic.    Dental Care: Organization         Address  Phone  Notes  [facility] Department of [facility] [place] [phone]  Accepts children up to age 66 who are enrolled in [place] or [place]; pregnant women with a Medicaid card; and children who have applied for Medicaid or [place] Health Choice, but were declined, whose parents can pay a reduced fee at time of service.  [facility] Department of [facility]  [address], [place] [phone] Accepts children up to age 74 who are enrolled in [place] or [place]; pregnant women with a Medicaid card; and children who have applied for Medicaid or [place] Health Choice, but were declined, whose parents can pay a reduced fee at time of service.  [place] Adult [facility] Access PROGRAM  [place] [phone] Patients are seen by appointment only. Walk-ins are not accepted. [place] will see patients 80 years of age and older. Monday - Tuesday (8am-5pm) Most  Wednesdays (8:30-5pm) $30 per visit, cash only  [facility]  [address], [facility] [phone] Patients are seen by appointment only. Walk-ins are not accepted. [place] will see patients 58 years of age and older. One Wednesday Evening (Monthly: Volunteer Based).  $30 per visit, cash only  [place]  [phone] for adults; Children under age 55, call Graduate Pediatric Dentistry at [phone]. Children aged 14-14, please call [phone] to request a pediatric application.  Dental services are provided in all areas of dental care including fillings, crowns and bridges, complete and partial dentures, implants, gum treatment, root canals, and extractions. Preventive care is also provided. Treatment is provided to both adults and children. Patients are selected via a lottery and there is often a waiting list.   [facility] [address] [place]  [phone] [email or web address]   [facility] [address] [place], [place] [phone], Ext. [phone] Second  and Fourth Thursday of each month, opens at 6:30 AM; Clinic ends at 9 AM.  Patients are seen on a first-come first-served basis, and a limited number are seen during each clinic.   [facility]  [address] [place] Danker [place], [place] [phone]   Eligibility Requirements You must have lived in [place], [place], or [place] counties for at least the last three months.   You cannot be eligible for state or federal sponsored [employer], including [employer], [place], or [employer].   You generally cannot be eligible for healthcare insurance through your employer.    How to apply: Eligibility screenings are held every Tuesday and Wednesday afternoon from 1:00 pm until 4:00 pm. You do not need an appointment for the interview!  [facility] [address] [place], [place]   [place]  [place] Department  [place]  [phone]    Behavioral Health Resources in the Community: Intensive Outpatient Programs Organization         Address  Phone  Notes  [place]. [address] [place], [place] [phone]   [facility] [address] [place], [place]   ADS: [facility] [address] [place], [place]   [place] [address]  [place], [place] or [phone]   Substance Abuse Resources Organization         Address  Phone  Notes  Alcohol and Drug Services  [phone]   [place]  6067822040   The [place]   Chinita Pester  [phone]   Residential & Outpatient Substance Abuse Program  [phone]   Psychological Services Organization         Address  Phone  Notes  [facility] [place]  [place]  [phone]   [place] [address], [place] or [phone]    Mobile Crisis Teams Organization         Address  Phone  Notes  [facility], Mobile Crisis Care Unit  [phone]   [facility]ive Psychotherapeutic Services  [address] [place], [place]   [place] Levels [address], [place] [phone]    Self-Help/Support Groups Organization         Address  Phone  Notes  Mental Health Assoc. of [place] - variety of support groups  Lydia Call for more information  Narcotics Anonymous (NA), [facility] [address], [employer] [place]  2 meetings at this location   Special educational needs teacher         Address  Phone  Notes  ASAP Residential Treatment [place],    [place]  [phone]   [facility]  [address], [place] [id number], [place], [place]   [place], [place] [phone] Admissions: 8am-3pm M-F  Incentives Substance [place] [address]    [place], [place] [phone]   [facility] [address] [place], [place], [place]   [facility] [address],  [place], [place]   [facility] Programs - Intensive Outpatient [address] Kristeen Mans [address], [place], [place]   [facility] [address].,  [place], [place] [phone] or [phone]   [facility]idential Treatment Services (RTS) [address] [place], [place] Accepts Medicaid  [facility] [place] [address],  [place] [phone] Substance Abuse/Addiction Treatment   [facility] Organization         Address  Phone  Notes  [facility]  [phone]   Domenic Schwab, PhD [address] Arlis Porta [place], [place]   [phone] or [phone]   [facility] [place], [place]  [phone]   [facility] [address] [place], [place] [phone] Insurance/Medicaid/sponsorship through [facility] Families [address]                                    [place], [place] [phone] [place] [address], [place] [phone]    Dr. Adele Schilder  [phone]   [facility] Dept. 1) [address] [place] 2) [place] 3)  [place] [address], [place] [phone]  [phone]   [place] [phone] or [phone] (After Hours)

## 2013-06-01 NOTE — ED Notes (Signed)
Report given to [facility] on 3W

## 2013-06-01 NOTE — ED Notes (Signed)
Pt. reports mid chest pain onset this evening with SOB and productive cough , denies nausea or diaphoresis .

## 2013-06-01 NOTE — ED Notes (Signed)
Patient transported to X-ray 

## 2013-06-02 DIAGNOSIS — F319 Bipolar disorder, unspecified: Secondary | ICD-10-CM

## 2013-06-02 DIAGNOSIS — G8929 Other chronic pain: Secondary | ICD-10-CM

## 2013-06-02 DIAGNOSIS — R0789 Other chest pain: Secondary | ICD-10-CM

## 2013-06-02 LAB — EXPECTORATED SPUTUM ASSESSMENT W REFEX TO RESP CULTURE

## 2013-06-02 LAB — EXPECTORATED SPUTUM ASSESSMENT W GRAM STAIN, RFLX TO RESP C

## 2013-06-02 LAB — GLUCOSE, CAPILLARY
GLUCOSE-CAPILLARY: 126 mg/dL — AB (ref 70–99)
GLUCOSE-CAPILLARY: 84 mg/dL (ref 70–99)
GLUCOSE-CAPILLARY: 105 mg/dL — AB (ref 70–99)
Glucose-Capillary: 126 mg/dL — ABNORMAL HIGH (ref 70–99)

## 2013-06-02 LAB — LEGIONELLA ANTIGEN, URINE: Legionella Antigen, Urine: NEGATIVE

## 2013-06-02 MED ORDER — DIPHENHYDRAMINE HCL 50 MG/ML IJ SOLN
25.0000 mg | Freq: Once | INTRAMUSCULAR | Status: AC
  Administered 2013-06-02: 25 mg via INTRAVENOUS
  Filled 2013-06-02: qty 1

## 2013-06-02 MED ORDER — IPRATROPIUM-ALBUTEROL 0.5-2.5 (3) MG/3ML IN SOLN
3.0000 mL | RESPIRATORY_TRACT | Status: DC | PRN
Start: 2013-06-02 — End: 2013-06-03

## 2013-06-02 MED ORDER — KETOROLAC TROMETHAMINE 15 MG/ML IJ SOLN
15.0000 mg | Freq: Once | INTRAMUSCULAR | Status: DC
  Filled 2013-06-02: qty 1

## 2013-06-02 MED ORDER — IBUPROFEN 400 MG PO TABS
400.0000 mg | ORAL_TABLET | Freq: Four times a day (QID) | ORAL | Status: DC | PRN
  Administered 2013-06-02: 400 mg via ORAL
  Filled 2013-06-02 (×2): qty 1

## 2013-06-02 MED ORDER — DIPHENHYDRAMINE HCL 25 MG PO CAPS: 25.0000 mg | ORAL_CAPSULE | ORAL | Status: DC | PRN

## 2013-06-02 NOTE — Progress Notes (Signed)
[facility] PROGRESS NOTE  Melanie Reynolds [id number] DOB: [birth year] DOA: 06/01/2013 PCP: Philis Fendt, MD  Assessment/Plan: 1-CAP (community acquired pneumonia)  - small opacity noted in the left base on chest x-ray which represent atelectasis or early infectious infiltrate. Continue with  azithromycin and ceftriaxone.  -guaifenesin for her cough.  -Sputum culture pending,  -legionella and strep antigen negative.  -HIV non reactive.  -Continue with Tamiflu.   2-Chest pain, musculoskeletal  EKG showed no ischemic changes, troponin is negative.  Patient's chest pain is likely musculoskeletal as it is reproducible with palpation of the chest wall.  -Troponin times 3 negative.   Headache: ibuprofen PRN. Neuro exam non focal.   Bipolar 1 disorder  Will continue patient's lithium.   Hypotension  Has resolved with IV fluids in the emergency room.   Chronic pain  Will continue her home pain regimen of gabapentin, morphine.   Diabetes  Levemir, SSI.   Hyperlipidemia  Will continue patient on simvastatin.  LDL 88  Code Status: Full Code.  Family Communication: Care discussed with patient.  Disposition Plan: remain inpatient.    Consultants:  none  Procedures:  none  Antibiotics:  Ceftriaxone 2-04  Azithromycin 2-04  HPI/Subjective: Breathing better but not at baseline.  Cough better.  Complaining of headaches.   Objective: Filed Vitals:   06/02/13 0634  BP: 105/55  Pulse: 128  Temp: 98.8 F (37.1 C)  Resp: 22    Intake/Output Summary (Last 24 hours) at 06/02/13 1704 Last data filed at 06/01/13 1738  Gross per 24 hour  Intake    240 ml  Output      0 ml  Net    240 ml   Filed Weights   06/01/13 0207  Weight: 101.152 kg (223 lb)    Exam:   General:  No distress.   Cardiovascular: S 1, S 2 RRR  Respiratory: bilateral ronchus, crackles.   Abdomen: BS present, soft, NT  Musculoskeletal: no edema.   Data Reviewed: Basic  Metabolic Panel:  Recent Labs Lab 06/01/13 0212 06/01/13 1000  NA 136*  --   K 3.6*  --   CL 96  --   CO2 23  --   GLUCOSE 128*  --   BUN 11  --   CREATININE 0.84 0.68  CALCIUM 9.2  --   MG  --  2.0  PHOS  --  2.4   Liver Function Tests: No results found for this basename: AST, ALT, ALKPHOS, BILITOT, PROT, ALBUMIN,  in the last 168 hours No results found for this basename: LIPASE, AMYLASE,  in the last 168 hours No results found for this basename: AMMONIA,  in the last 168 hours CBC:  Recent Labs Lab 06/01/13 0212 06/01/13 1000  WBC 10.9* 8.7  HGB 14.5 12.4  HCT 43.2 38.3  MCV 88.7 88.2  PLT 286 274   Cardiac Enzymes:  Recent Labs Lab 06/01/13 1000 06/01/13 1400 06/01/13 2100  TROPONINI <0.30 <0.30 <0.30   BNP (last 3 results)  Recent Labs  06/01/13 0212  PROBNP 29.4   CBG:  Recent Labs Lab 06/01/13 1354 06/01/13 1720 06/01/13 2017 06/02/13 0735 06/02/13 1132  GLUCAP 91 115* 114* 126* 126*    Recent Results (from the past 240 hour(s))  CULTURE, BLOOD (ROUTINE X 2)     Status: None   Collection Time    06/01/13  9:42 AM      Result Value Range Status   Specimen Description BLOOD HAND LEFT   Final  Special Requests BOTTLES DRAWN AEROBIC AND ANAEROBIC 5CC   Final   Culture  Setup Time     Final   Value: 06/01/2013 13:59     Performed at [facility]   Culture     Final   Value:        BLOOD CULTURE RECEIVED NO GROWTH TO DATE CULTURE WILL BE HELD FOR 5 DAYS BEFORE ISSUING A FINAL NEGATIVE REPORT     Performed at [facility]   Report Status PENDING   Incomplete  CULTURE, BLOOD (ROUTINE X 2)     Status: None   Collection Time    06/01/13 10:00 AM      Result Value Range Status   Specimen Description BLOOD HAND RIGHT   Final   Special Requests BOTTLES DRAWN AEROBIC AND ANAEROBIC 5CC   Final   Culture  Setup Time     Final   Value: 06/01/2013 13:59     Performed at [facility]   Culture     Final   Value:         BLOOD CULTURE RECEIVED NO GROWTH TO DATE CULTURE WILL BE HELD FOR 5 DAYS BEFORE ISSUING A FINAL NEGATIVE REPORT     Performed at [facility]   Report Status PENDING   Incomplete  CULTURE, EXPECTORATED SPUTUM-ASSESSMENT     Status: None   Collection Time    06/01/13 10:15 PM      Result Value Range Status   Specimen Description SPUTUM   Final   Special Requests NONE   Final   Sputum evaluation     Final   Value: MICROSCOPIC FINDINGS SUGGEST THAT THIS SPECIMEN IS NOT REPRESENTATIVE OF LOWER RESPIRATORY SECRETIONS. PLEASE RECOLLECT.     CALLED TO K.WILLIARD,RN AT 0018 06/02/13 BY L.PITT   Report Status 06/02/2013 FINAL   Final     Studies: Dg Chest 2 View  06/01/2013   CLINICAL DATA:  Cough and chest pain  EXAM: CHEST  2 VIEW  COMPARISON:  03/05/2013  FINDINGS: There is an indistinct opacity at the left base, likely in the lower lobe when correlated with the lateral view. No evidence of effusion or pneumothorax. Interstitial coarsening which is likely chronic and bronchitic. No cardiomegaly. No effusion or pneumothorax. Cholecystectomy changes.  IMPRESSION: Small indistinct opacity at the left base which could represent atelectasis or early infectious infiltrate.   Electronically Signed   By: Jorje Guild M.D.   On: 06/01/2013 02:39    Scheduled Meds: . azithromycin  500 mg Intravenous Q24H  . cefTRIAXone (ROCEPHIN)  IV  1 g Intravenous Q24H  . dextromethorphan-guaiFENesin  1 tablet Oral BID  . enoxaparin (LOVENOX) injection  40 mg Subcutaneous Q24H  . fluticasone  2 spray Each Nare Daily  . gabapentin  300 mg Oral BID  . insulin aspart  0-15 Units Subcutaneous TID WC  . insulin detemir  12 Units Subcutaneous QHS  . ipratropium-albuterol  3 mL Nebulization Q4H  . ketorolac  15 mg Intravenous Once  . lithium carbonate  300 mg Oral BID  . morphine  30 mg Oral Q12H  .  morphine injection  4 mg Intravenous Once  . nicotine  21 mg Transdermal Daily  . oseltamivir  75 mg Oral BID   . potassium chloride SA  20 mEq Oral Daily  . simvastatin  10 mg Oral Daily  . traZODone  50 mg Oral Daily   Continuous Infusions:   Active Problems:   Pneumonia  CAP (community acquired pneumonia)   Chest pain, musculoskeletal   Bipolar 1 disorder   Hypotension   Chronic pain   Diabetes    Time spent: 25 minutes.     Fantashia Shupert  [facility] Pager [phone]. If 7PM-7AM, please contact night-coverage at [email or web address], password [facility] 06/02/2013, 5:04 PM  LOS: 1 day

## 2013-06-03 LAB — BASIC METABOLIC PANEL
BUN: 7 mg/dL (ref 6–23)
CO2: 21 mEq/L (ref 19–32)
Calcium: 9 mg/dL (ref 8.4–10.5)
Chloride: 110 mEq/L (ref 96–112)
Creatinine, Ser: 0.73 mg/dL (ref 0.50–1.10)
GFR calc non Af Amer: >90 mL/min (ref >90)
Glucose, Bld: 91 mg/dL (ref 70–99)
Potassium: 4.3 mEq/L (ref 3.7–5.3)
Sodium: 145 mEq/L (ref 137–147)

## 2013-06-03 LAB — GLUCOSE, CAPILLARY
GLUCOSE-CAPILLARY: 84 mg/dL (ref 70–99)
GLUCOSE-CAPILLARY: 127 mg/dL — AB (ref 70–99)
GLUCOSE-CAPILLARY: 94 mg/dL (ref 70–99)

## 2013-06-03 LAB — CBC
HCT: 38.2 % (ref 36.0–46.0)
Hemoglobin: 12.3 g/dL (ref 12.0–15.0)
MCH: 29.1 pg (ref 26.0–34.0)
MCHC: 32.2 g/dL (ref 30.0–36.0)
MCV: 90.3 fL (ref 78.0–100.0)
PLATELETS: 296 10*3/uL (ref 150–400)
RBC: 4.23 MIL/uL (ref 3.87–5.11)
RDW: 14.2 % (ref 11.5–15.5)
WBC: 6.7 10*3/uL (ref 4.0–10.5)

## 2013-06-03 MED ORDER — DIPHENHYDRAMINE HCL 25 MG PO CAPS: 25.0000 mg | ORAL_CAPSULE | Freq: Three times a day (TID) | ORAL | Status: DC | PRN

## 2013-06-03 MED ORDER — FLUTICASONE PROPIONATE 50 MCG/ACT NA SUSP: 2.0000 | Freq: Every day | NASAL | Status: AC

## 2013-06-03 MED ORDER — LEVOFLOXACIN 750 MG PO TABS: 750.0000 mg | ORAL_TABLET | Freq: Every day | ORAL | Status: DC

## 2013-06-03 MED ORDER — BENZONATATE 100 MG PO CAPS: 100.0000 mg | ORAL_CAPSULE | Freq: Three times a day (TID) | ORAL | Status: DC | PRN

## 2013-06-03 MED ORDER — AZITHROMYCIN 500 MG PO TABS: 500.0000 mg | ORAL_TABLET | Freq: Every day | ORAL | Status: DC

## 2013-06-03 MED ORDER — ALBUTEROL SULFATE HFA 108 (90 BASE) MCG/ACT IN AERS: 2.0000 | INHALATION_SPRAY | Freq: Four times a day (QID) | RESPIRATORY_TRACT | Status: DC | PRN

## 2013-06-03 NOTE — Discharge Summary (Signed)
Physician Discharge Summary  Magdalyn Reynolds [id number] DOB: 09-25-1950 DOA: 06/01/2013  PCP: Philis Fendt, MD  Admit date: 06/01/2013 Discharge date: 06/03/2013  Time spent: 35 minutes  Recommendations for Outpatient Follow-up:  1. Follow up with PCP for resolution of PNA.  2. Need continue counseling regarding smoking cessation.   Discharge Diagnoses:    CAP (community acquired pneumonia)   Chest pain, musculoskeletal   Bipolar 1 disorder   Hypotension   Chronic pain   Diabetes   Discharge Condition: Stable.   Diet recommendation: Carb Modified.   Filed Weights   06/01/13 0207  Weight: 101.152 kg (223 lb)    History of present illness:  Melanie Reynolds is a 63 y.o. female  With a history of diabetes mellitus, bipolar disorder, recent bronchitis in December 2014 that presents emergency department with complaints of cough, congestion, myalgias and some chest pain. Patient states the symptoms have been ongoing for approximately one and a half months. She did see her primary care physician before Christmastime and was placed on some antibiotic however does not remove the name of the antibiotic. She did have some of her symptoms resolved however they returned. Patient does have an 26 year old granddaughter at home with similar symptoms and presumably the flu. Patient also states that she had a fever at home of approximately 101F. She feels that her symptoms have been worsening since Sunday. Patient does complain of a productive cough with green sputum production. She denies any recent travel. Patient also complains of chest pain which he says is midsternal it does not radiate anywhere. She does feel this is likely secondary to her coughing.   Hospital Course:  1-CAP (community acquired pneumonia)  - Small opacity noted in the left base on chest x-ray which represent atelectasis or early infectious infiltrate. Received azithromycin and ceftriaxone for 3 days . -guaifenesin for her  cough.  -Sputum culture no consistent with lower respiratory tract flora.  -legionella and strep antigen negative.  -HIV non reactive.  -will discontinue tamiflu. Influenza panel negative, no fever during this admission.  -Plan to discharge on Levaquin for 5 more days.   2-Chest pain, musculoskeletal  EKG showed no ischemic changes, troponin is negative. Patient's chest pain is likely musculoskeletal as it is reproducible with palpation of the chest wall.  -Troponin times 3 negative.   Headache:  Neuro exam non focal. Resolved.   Bipolar 1 disorder  Will continue patient's lithium.   Hypotension  Has resolved with IV fluids in the emergency room.   Chronic pain  Will continue her home pain regimen of gabapentin, morphine.   Diabetes  Levemir, SSI.   Hyperlipidemia  Will continue patient on simvastatin.  LDL 88  left eye swelling; ? Allergic reaction. Swelling started after she took ibuprofen. This has resolved with one dose of benadryl.    Procedures:  none  Consultations:  none  Discharge Exam: Filed Vitals:   06/03/13 0500  BP: 104/63  Pulse: 66  Temp: 97.6 F (36.4 C)  Resp: 16    General: No distress.  Cardiovascular: S 1, S 2 RRR Respiratory: few ronchus no wheezes.   Discharge Instructions      Discharge Orders   Future Appointments Provider Department Dept Phone   06/22/2013 8:00 PM [facility] [phone]   Future Orders Complete By Expires   Diet Carb Modified  As directed    Increase activity slowly  As directed        Medication List  albuterol 108 (90 BASE) MCG/ACT inhaler  Commonly known as:  PROVENTIL HFA;VENTOLIN HFA  Inhale 2 puffs into the lungs every 6 (six) hours as needed for wheezing or shortness of breath.     benzonatate 100 MG capsule  Commonly known as:  TESSALON  Take 1 capsule (100 mg total) by mouth 3 (three) times daily as needed for cough.     diphenhydrAMINE 25 mg  capsule  Commonly known as:  BENADRYL  Take 1 capsule (25 mg total) by mouth every 8 (eight) hours as needed for itching or allergies.     fluticasone 50 MCG/ACT nasal spray  Commonly known as:  FLONASE  Place 2 sprays into both nostrils daily.     gabapentin 300 MG capsule  Commonly known as:  NEURONTIN  Take 300 mg by mouth 2 (two) times daily.     LEVEMIR FLEXTOUCH 100 UNIT/ML Pen  Generic drug:  Insulin Detemir  Inject 12 Units into the skin at bedtime.     levofloxacin 750 MG tablet  Commonly known as:  LEVAQUIN  Take 1 tablet (750 mg total) by mouth daily.     lithium carbonate 300 MG CR tablet  Commonly known as:  LITHOBID  Take 1 tablet by mouth 2 (two) times daily.     meclizine 12.5 MG tablet  Commonly known as:  ANTIVERT  Take 12.5 mg by mouth 3 (three) times daily as needed. For dizziness     morphine 30 MG tablet  Commonly known as:  MSIR  Take 30 mg by mouth every 12 (twelve) hours.     potassium chloride SA 20 MEQ tablet  Commonly known as:  K-DUR,KLOR-CON  Take 20 mEq by mouth daily.     simvastatin 10 MG tablet  Commonly known as:  ZOCOR  Take 1 tablet by mouth daily.     traZODone 50 MG tablet  Commonly known as:  DESYREL  Take 1 tablet by mouth daily.       Allergies  Allergen Reactions  . Ibuprofen     Patient develop eye swelling and redness, after ibuprofen dose.   . Latex Itching   Follow-up Information   Follow up with [facility] In 2 days. (You should arrange for a recheck with your primary care doctor in the next 2-3 days , use the resource guide if you need)    Contact information:   [place] [id number] [phone]       The results of significant diagnostics from this hospitalization (including imaging, microbiology, ancillary and laboratory) are listed below for reference.    Significant Diagnostic Studies: Dg Chest 2 View  06/01/2013   CLINICAL DATA:  Cough and chest  pain  EXAM: CHEST  2 VIEW  COMPARISON:  03/05/2013  FINDINGS: There is an indistinct opacity at the left base, likely in the lower lobe when correlated with the lateral view. No evidence of effusion or pneumothorax. Interstitial coarsening which is likely chronic and bronchitic. No cardiomegaly. No effusion or pneumothorax. Cholecystectomy changes.  IMPRESSION: Small indistinct opacity at the left base which could represent atelectasis or early infectious infiltrate.   Electronically Signed   By: Jorje Guild M.D.   On: 06/01/2013 02:39    Microbiology: Recent Results (from the past 240 hour(s))  CULTURE, BLOOD (ROUTINE X 2)     Status: None   Collection Time    06/01/13  9:42 AM      Result Value Range  Status   Specimen Description BLOOD HAND LEFT   Final   Special Requests BOTTLES DRAWN AEROBIC AND ANAEROBIC 5CC   Final   Culture  Setup Time     Final   Value: 06/01/2013 13:59     Performed at [facility]   Culture     Final   Value:        BLOOD CULTURE RECEIVED NO GROWTH TO DATE CULTURE WILL BE HELD FOR 5 DAYS BEFORE ISSUING A FINAL NEGATIVE REPORT     Performed at [facility]   Report Status PENDING   Incomplete  CULTURE, BLOOD (ROUTINE X 2)     Status: None   Collection Time    06/01/13 10:00 AM      Result Value Range Status   Specimen Description BLOOD HAND RIGHT   Final   Special Requests BOTTLES DRAWN AEROBIC AND ANAEROBIC 5CC   Final   Culture  Setup Time     Final   Value: 06/01/2013 13:59     Performed at [facility]   Culture     Final   Value:        BLOOD CULTURE RECEIVED NO GROWTH TO DATE CULTURE WILL BE HELD FOR 5 DAYS BEFORE ISSUING A FINAL NEGATIVE REPORT     Performed at [facility]   Report Status PENDING   Incomplete  CULTURE, EXPECTORATED SPUTUM-ASSESSMENT     Status: None   Collection Time    06/01/13 10:15 PM      Result Value Range Status   Specimen Description SPUTUM   Final   Special Requests NONE   Final    Sputum evaluation     Final   Value: MICROSCOPIC FINDINGS SUGGEST THAT THIS SPECIMEN IS NOT REPRESENTATIVE OF LOWER RESPIRATORY SECRETIONS. PLEASE RECOLLECT.     CALLED TO K.WILLIARD,RN AT 0018 06/02/13 BY L.PITT   Report Status 06/02/2013 FINAL   Final     Labs: Basic Metabolic Panel:  Recent Labs Lab 06/01/13 0212 06/01/13 1000 06/03/13 0555  NA 136*  --  145  K 3.6*  --  4.3  CL 96  --  110  CO2 23  --  21  GLUCOSE 128*  --  91  BUN 11  --  7  CREATININE 0.84 0.68 0.73  CALCIUM 9.2  --  9.0  MG  --  2.0  --   PHOS  --  2.4  --    Liver Function Tests: No results found for this basename: AST, ALT, ALKPHOS, BILITOT, PROT, ALBUMIN,  in the last 168 hours No results found for this basename: LIPASE, AMYLASE,  in the last 168 hours No results found for this basename: AMMONIA,  in the last 168 hours CBC:  Recent Labs Lab 06/01/13 0212 06/01/13 1000 06/03/13 0555  WBC 10.9* 8.7 6.7  HGB 14.5 12.4 12.3  HCT 43.2 38.3 38.2  MCV 88.7 88.2 90.3  PLT 286 274 296   Cardiac Enzymes:  Recent Labs Lab 06/01/13 1000 06/01/13 1400 06/01/13 2100  TROPONINI <0.30 <0.30 <0.30   BNP: BNP (last 3 results)  Recent Labs  06/01/13 0212  PROBNP 29.4   CBG:  Recent Labs Lab 06/02/13 1132 06/02/13 1706 06/02/13 2123 06/03/13 0734 06/03/13 1202  GLUCAP 126* 84 105* 84 127*       Signed:  Genese Quebedeaux  [facility] 06/03/2013, 12:06 PM

## 2013-06-07 LAB — CULTURE, BLOOD (ROUTINE X 2)
CULTURE: NO GROWTH
CULTURE: NO GROWTH

## 2013-06-22 ENCOUNTER — Ambulatory Visit (HOSPITAL_BASED_OUTPATIENT_CLINIC_OR_DEPARTMENT_OTHER): Payer: Medicare Other | Attending: Internal Medicine | Admitting: Radiology

## 2013-06-22 VITALS — Ht 63.0 in | Wt 226.0 lb

## 2013-06-22 DIAGNOSIS — G4733 Obstructive sleep apnea (adult) (pediatric): Secondary | ICD-10-CM | POA: Insufficient documentation

## 2013-06-26 NOTE — Sleep Study (Signed)
   NAME: Melanie Reynolds DATE OF BIRTH:  1950-12-04 MEDICAL RECORD NUMBER [id number]  LOCATION: [facility]  PHYSICIAN: Kron Everton D  DATE OF STUDY: 06/22/2013  SLEEP STUDY TYPE: Nocturnal Polysomnogram               REFERRING PHYSICIAN: Philis Fendt, MD  INDICATION FOR STUDY: Hypersomnia with sleep apnea  EPWORTH SLEEPINESS SCORE:   7/24 HEIGHT: 5\' 3"  (160 cm)  WEIGHT: 226 lb (102.513 kg)    Body mass index is 40.04 kg/(m^2).  NECK SIZE: 18 in.  MEDICATIONS: Charted for review  SLEEP ARCHITECTURE: Split study protocol. During the diagnostic phase, total sleep time 120.5 minutes with sleep efficiency 68.9%. Stage I was 5%, stage II 78.4%, stage III absent, REM 16.6% of total sleep time. Sleep latency 52.5 minutes, REM latency 79.5 minutes. Sleep latency 52.5 minutes, REM latency 79.5 minutes, awake after sleep onset 2 minutes, arousal index 17.9. Bedtime medication: Levimir  RESPIRATORY DATA: Apnea hypopnea index (AHI) 23.4 per hour. 47 total events scored including 9 obstructive apneas and 38 hypopneas. Events were associated with supine sleep position. REM AHI 54. CPAP was titrated to 10 CWP, AHI 0.5 per hour. She wore an [employer] mask, 1 size fits all.  OXYGEN DATA: Moderate snoring before CPAP with oxygen desaturation to a nadir of 82% on room air. With CPAP control, snoring was prevented and mean oxygen saturation of 94.3% on room air.  CARDIAC DATA: Sinus rhythm with PACs  MOVEMENT/PARASOMNIA: A few limb jerks were noted during the titration phase, which is common.  IMPRESSION/ RECOMMENDATION:   1) Moderate obstructive sleep apnea/hypopneas syndrome, AHI 23.4 per hour with supine events. Moderate snoring with oxygen desaturation to a nadir of 82% on room air. 2) Successful CPAP titration to 10 CWP, AHI 0.5 per hour. She wore a [employer] "One size fits all" mask with heated humidifier. Snoring was prevented and mean oxygen saturation of  94.3% on room air with CPAP.  Signs [name] M.D. Deneise Lever Diplomate, American Board of Sleep Medicine  ELECTRONICALLY SIGNED ON:  06/26/2013, 9:45 AM [place] PH: [phone]   FX: [phone] [place]

## 2013-08-13 ENCOUNTER — Ambulatory Visit (INDEPENDENT_AMBULATORY_CARE_PROVIDER_SITE_OTHER): Payer: Medicare Other | Admitting: Pulmonary Disease

## 2013-08-13 ENCOUNTER — Encounter: Payer: Self-pay | Admitting: Pulmonary Disease

## 2013-08-13 VITALS — BP 112/86 | HR 80 | Temp 99.0°F | Ht 63.0 in | Wt 232.0 lb

## 2013-08-13 DIAGNOSIS — G4733 Obstructive sleep apnea (adult) (pediatric): Secondary | ICD-10-CM | POA: Diagnosis not present

## 2013-08-13 NOTE — Progress Notes (Signed)
Subjective:    Patient ID: Melanie Reynolds, female    DOB: 03-27-1951, 63 y.o.   MRN: [id number]  HPI The patient is a 63 year old female who I've been asked to see for management of obstructive sleep apnea. She tells me that she was diagnosed with sleep apnea 5 years ago, and has been on CPAP since that time. She recently moved to the [facility] area, and was due for a new CPAP machine. In order to get this, she had to undergo another sleep study because they could not find her old one. This revealed moderate OSA, with an AHI of 23 events per hour. As part of a split night protocol, she was started on CPAP, and titrated to an optimal pressure of 10 cm of water.  She is been wearing CPAP at that pressure level with nasal pillows, and has not had issues with mouth opening.  She has been wearing CPAP compliantly, and sleeps extremely well. She feels rested in the mornings upon arising, and denies any sleepiness issues during the day even with inactivity. She has no issues at night watching television or movies, and no sleepiness with driving. The patient tells me that her weight is fairly neutral over the last 2 years, and her Epworth score today is 3.   Sleep Questionnaire What time do you typically go to bed?( Between what hours) 12-2 12-2 at 1529 on 08/13/13 by Virl Cagey, CMA How long does it take you to fall asleep? within mins within mins at 1529 on 08/13/13 by Virl Cagey, CMA How many times during the night do you wake up? 0 0 at 1529 on 08/13/13 by Virl Cagey, CMA What time do you get out of bed to start your day? No Value 7-8am at 1529 on 08/13/13 by Virl Cagey, CMA Do you drive or operate heavy machinery in your occupation? No No at 1529 on 08/13/13 by Virl Cagey, CMA How much has your weight changed (up or down) over the past two years? (In pounds) 60 lb (27.216 kg) 60 lb (27.216 kg) at 1529 on 08/13/13 by Virl Cagey, CMA Have you ever had a sleep study  before? Yes Yes at 1529 on 08/13/13 by Virl Cagey, CMA If yes, location of study? [facility] at 1529 on 08/13/13 by Virl Cagey, CMA If yes, date of study? 07/21/13 07/21/13 at 1529 on 08/13/13 by Virl Cagey, CMA Do you currently use CPAP? Yes Yes at 1529 on 08/13/13 by Virl Cagey, CMA If so, what pressure? 10 10 at 1529 on 08/13/13 by Virl Cagey, CMA Do you wear oxygen at any time? No    Review of Systems  Constitutional: Negative for fever and unexpected weight change.  HENT: Positive for postnasal drip and rhinorrhea. Negative for congestion, dental problem, ear pain, nosebleeds, sinus pressure, sneezing, sore throat and trouble swallowing.   Eyes: Negative for redness and itching.       Watering  Respiratory: Positive for cough, shortness of breath and wheezing. Negative for chest tightness.   Cardiovascular: Positive for leg swelling. Negative for palpitations.  Gastrointestinal: Negative for nausea and vomiting.  Genitourinary: Negative for dysuria.  Musculoskeletal: Negative for joint swelling.  Skin: Negative for rash.  Neurological: Negative for headaches.  Hematological: Does not bruise/bleed easily.  Psychiatric/Behavioral: Positive for dysphoric mood. The patient is nervous/anxious.        Objective:   Physical Exam Constitutional:  Overweight female, no acute distress  HENT:  Nares patent without discharge  Oropharynx without exudate, palate and uvula are elongated.  Eyes:  Perrla, eomi, no scleral icterus  Neck:  No JVD, no TMG  Cardiovascular:  Normal rate, regular rhythm, no rubs or gallops.  No murmurs        Intact distal pulses  Pulmonary :  Normal breath sounds, no stridor or respiratory distress   No rales or wheezing. Mild rhochi  Abdominal:  Soft, nondistended, bowel sounds present.  No tenderness noted.   Musculoskeletal:  mild lower extremity edema noted.  Lymph Nodes:  No cervical lymphadenopathy noted  Skin:  No  cyanosis noted  Neurologic:  Alert, appropriate, moves all 4 extremities without obvious deficit.         Assessment & Plan:

## 2013-08-13 NOTE — Assessment & Plan Note (Signed)
The patient has moderate obstructive sleep apnea, but is doing very well from a subjective standpoint with her CPAP. He tells me that she is wearing the device compliantly, has no issues with her mask fit, and is sleeping well with no daytime sleepiness.  We will need to get a download off her device in order to verify compliance to the Department of Motor Vehicles. I've also encouraged her to work aggressively on weight loss, and to keep up with her mask changes and supplies.

## 2013-08-13 NOTE — Patient Instructions (Signed)
We will get a download off your machine, and send letter to the address you provide Continue on cpap, and keep up with mask changes and supplies. Work on weight loss followup with me again in one year if doing well, but call if having issues with your cpap.

## 2013-08-16 ENCOUNTER — Telehealth: Payer: Self-pay | Admitting: Pulmonary Disease

## 2013-08-16 NOTE — Telephone Encounter (Signed)
Spoke with pt. Verified that her sleep study was the only thing she needed faxed. Fax # was also verified. Sleep study results will be faxed to [facility] DMV. Nothing further was needed.

## 2013-08-27 ENCOUNTER — Telehealth: Payer: Self-pay | Admitting: Pulmonary Disease

## 2013-08-27 NOTE — Telephone Encounter (Signed)
Called spoke with pt. She is aware of results. She reports we are to fax letter to [phone] Dept of Motor Vehicles Attn: Medical Review Branch Please advise [place] once letter is done and we can fax this. thanks

## 2013-08-27 NOTE — Telephone Encounter (Signed)
Per OV 08/13/13: Patient Instructions     We will get a download off your machine, and send letter to the address you provide  Continue on cpap, and keep up with mask changes and supplies.  Work on weight loss  followup with me again in one year if doing well, but call if having issues with your cpap.  --  Spoke with pt. She reports DMV has not received a letter from [place] yet based off of her download. She reports [facility] did a download off machine. Looked in [facility] paperwork and do not see any download. Sent Melissa a message to get this faxed over ASAP. Advised to fax this to triage.

## 2013-08-27 NOTE — Telephone Encounter (Signed)
Download received and placed in [place] look at. Please advise thanks 

## 2013-08-27 NOTE — Telephone Encounter (Signed)
Let her know the download shows good compliance and good control of her sleep apnea.  She was suppose to provide [place] with an address to send a note for her.  Does she know what this is?

## 2013-08-30 NOTE — Telephone Encounter (Signed)
Letter has been faxed to the [facility].   i have called the pt and she is aware that this has been done.  Letter placed in [facility] scan folder.

## 2013-08-30 NOTE — Telephone Encounter (Signed)
Note written and given to triage.

## 2013-10-11 ENCOUNTER — Ambulatory Visit: Payer: Medicare Other | Admitting: *Deleted

## 2014-04-02 ENCOUNTER — Emergency Department (HOSPITAL_COMMUNITY)
Admission: EM | Admit: 2014-04-02 | Discharge: 2014-04-02 | Disposition: A | Discharge status: Home / Self Care | Payer: Medicare Other | Attending: Emergency Medicine | Admitting: Emergency Medicine

## 2014-04-02 ENCOUNTER — Emergency Department (HOSPITAL_COMMUNITY): Payer: Medicare Other

## 2014-04-02 ENCOUNTER — Encounter (HOSPITAL_COMMUNITY): Payer: Self-pay | Admitting: Emergency Medicine

## 2014-04-02 DIAGNOSIS — Z9049 Acquired absence of other specified parts of digestive tract: Secondary | ICD-10-CM | POA: Insufficient documentation

## 2014-04-02 DIAGNOSIS — R109 Unspecified abdominal pain: Secondary | ICD-10-CM | POA: Insufficient documentation

## 2014-04-02 DIAGNOSIS — E785 Hyperlipidemia, unspecified: Secondary | ICD-10-CM | POA: Insufficient documentation

## 2014-04-02 DIAGNOSIS — F419 Anxiety disorder, unspecified: Secondary | ICD-10-CM | POA: Insufficient documentation

## 2014-04-02 DIAGNOSIS — G4733 Obstructive sleep apnea (adult) (pediatric): Secondary | ICD-10-CM | POA: Diagnosis not present

## 2014-04-02 DIAGNOSIS — E119 Type 2 diabetes mellitus without complications: Secondary | ICD-10-CM | POA: Diagnosis not present

## 2014-04-02 DIAGNOSIS — Z91040 Latex allergy status: Secondary | ICD-10-CM | POA: Diagnosis not present

## 2014-04-02 DIAGNOSIS — Z7951 Long term (current) use of inhaled steroids: Secondary | ICD-10-CM | POA: Insufficient documentation

## 2014-04-02 DIAGNOSIS — F319 Bipolar disorder, unspecified: Secondary | ICD-10-CM | POA: Diagnosis not present

## 2014-04-02 DIAGNOSIS — Z9889 Other specified postprocedural states: Secondary | ICD-10-CM | POA: Diagnosis not present

## 2014-04-02 DIAGNOSIS — Z9851 Tubal ligation status: Secondary | ICD-10-CM | POA: Diagnosis not present

## 2014-04-02 DIAGNOSIS — Z90710 Acquired absence of both cervix and uterus: Secondary | ICD-10-CM | POA: Diagnosis not present

## 2014-04-02 DIAGNOSIS — Z8701 Personal history of pneumonia (recurrent): Secondary | ICD-10-CM | POA: Diagnosis not present

## 2014-04-02 DIAGNOSIS — M1990 Unspecified osteoarthritis, unspecified site: Secondary | ICD-10-CM | POA: Diagnosis not present

## 2014-04-02 DIAGNOSIS — Z8781 Personal history of (healed) traumatic fracture: Secondary | ICD-10-CM | POA: Insufficient documentation

## 2014-04-02 DIAGNOSIS — G8929 Other chronic pain: Secondary | ICD-10-CM | POA: Diagnosis not present

## 2014-04-02 DIAGNOSIS — Z79899 Other long term (current) drug therapy: Secondary | ICD-10-CM | POA: Insufficient documentation

## 2014-04-02 LAB — BASIC METABOLIC PANEL
Anion gap: 11 (ref 5–15)
BUN: 12 mg/dL (ref 6–23)
CALCIUM: 9.7 mg/dL (ref 8.4–10.5)
CO2: 25 mEq/L (ref 19–32)
CREATININE: 0.82 mg/dL (ref 0.50–1.10)
Chloride: 105 mEq/L (ref 96–112)
GFR, EST AFRICAN AMERICAN: 87 mL/min — AB (ref >90)
GFR, EST NON AFRICAN AMERICAN: 75 mL/min — AB (ref >90)
Glucose, Bld: 92 mg/dL (ref 70–99)
Potassium: 4.2 mEq/L (ref 3.7–5.3)
Sodium: 141 mEq/L (ref 137–147)

## 2014-04-02 LAB — URINALYSIS, ROUTINE W REFLEX MICROSCOPIC
Bilirubin Urine: NEGATIVE
Glucose, UA: NEGATIVE mg/dL
HGB URINE DIPSTICK: NEGATIVE
Ketones, ur: NEGATIVE mg/dL
Leukocytes, UA: NEGATIVE
Nitrite: NEGATIVE
Protein, ur: NEGATIVE mg/dL
Specific Gravity, Urine: 1.012 (ref 1.005–1.030)
Urobilinogen, UA: 1 mg/dL (ref 0.0–1.0)
pH: 6.5 (ref 5.0–8.0)

## 2014-04-02 LAB — CBC WITH DIFFERENTIAL/PLATELET
Basophils Absolute: 0 10*3/uL (ref 0.0–0.1)
Basophils Relative: 0 % (ref 0–1)
EOS ABS: 0.1 10*3/uL (ref 0.0–0.7)
Eosinophils Relative: 1 % (ref 0–5)
HCT: 43.6 % (ref 36.0–46.0)
Hemoglobin: 14.4 g/dL (ref 12.0–15.0)
Lymphocytes Relative: 42 % (ref 12–46)
Lymphs Abs: 3 10*3/uL (ref 0.7–4.0)
MCH: 29.6 pg (ref 26.0–34.0)
MCHC: 33 g/dL (ref 30.0–36.0)
MCV: 89.7 fL (ref 78.0–100.0)
MONOS PCT: 11 % (ref 3–12)
Monocytes Absolute: 0.8 10*3/uL (ref 0.1–1.0)
NEUTROS PCT: 46 % (ref 43–77)
Neutro Abs: 3.2 10*3/uL (ref 1.7–7.7)
PLATELETS: 307 10*3/uL (ref 150–400)
RBC: 4.86 MIL/uL (ref 3.87–5.11)
RDW: 14.1 % (ref 11.5–15.5)
WBC: 7.1 10*3/uL (ref 4.0–10.5)

## 2014-04-02 MED ORDER — MORPHINE SULFATE 4 MG/ML IJ SOLN
4.0000 mg | Freq: Once | INTRAMUSCULAR | Status: AC
  Administered 2014-04-02: 4 mg via INTRAVENOUS

## 2014-04-02 MED ORDER — ONDANSETRON HCL 4 MG/2ML IJ SOLN
4.0000 mg | Freq: Once | INTRAMUSCULAR | Status: AC
  Administered 2014-04-02: 4 mg via INTRAVENOUS

## 2014-04-02 MED ORDER — SODIUM CHLORIDE 0.9 % IV BOLUS (SEPSIS)
500.0000 mL | Freq: Once | INTRAVENOUS | Status: AC
  Administered 2014-04-02: 500 mL via INTRAVENOUS

## 2014-04-02 MED ORDER — HYDROCODONE-ACETAMINOPHEN 5-325 MG PO TABS: 1.0000 | ORAL_TABLET | ORAL | Status: DC | PRN

## 2014-04-02 NOTE — ED Notes (Addendum)
Pt from home with c/o right lower back pain radiating to RUQ, 10/10 pain starting this am.  Worse with movement and with fluid intake.  Pt reports slow urination stream, denies blood in urine, foul odor, no hx of kidney stones, normal BM yesterday.  Pt in NAD, A&O.

## 2014-04-02 NOTE — ED Notes (Signed)
Pt provided ginger ale and crackers. MD reports this is acceptable. Questions about CT, made aware there is no contrast involved.

## 2014-04-02 NOTE — ED Provider Notes (Signed)
CSN: [id number]     Arrival date & time 04/02/14  1017 History   First MD Initiated Contact with Patient 04/02/14 1110     Chief Complaint  Patient presents with  . Flank Pain     (Consider location/radiation/quality/duration/timing/severity/associated sxs/prior Treatment) HPI  Melanie Reynolds is a 63 y.o. female who developed right-sided flank pain, which is constant since this morning and at times gets worse.  She complains of urinary frequency but no dysuria or hematuria.  She denies fever, chills, nausea, vomiting, weakness or dizziness.  She is taking her usual medications.  There are no other known modifying factors.    Past Medical History  Diagnosis Date  . Type II diabetes mellitus   . High cholesterol   . Pneumonia 1970's; 1980's; 06/01/2013    "total of 3 times"  . OSA (obstructive sleep apnea)   . Arthritis     "knees, legs, back" (06/01/2013)  . Chronic lower back pain   . Bipolar depression   . Anxiety    Past Surgical History  Procedure Laterality Date  . Cholecystectomy  1990's  . Abdominal hysterectomy  1982  . Tubal ligation  1978  . Back surgery    . Posterior lumbar fusion  2000's  . Implantation vagal nerve stimulator  2013  . Vagal nerve stimulator removal  2013    "got an infection in there" (06/01/2013)  . Forearm fracture surgery Left 1999    "put in plates"  . Shoulder open rotator cuff repair Right 2000   Family History  Problem Relation Age of Onset  . Kidney failure Mother    History  Substance Use Topics  . Smoking status: Current Every Day Smoker -- 0.50 packs/day for 45 years    Types: Cigarettes  . Smokeless tobacco: Never Used     Comment: h/o 1 PPD  . Alcohol Use: Yes     Comment: 06/01/2013 "glass of wine maybe q 6 months, if that"   OB History    No data available     Review of Systems  All other systems reviewed and are negative.     Allergies  Latex  Home Medications   Prior to Admission medications   Medication Sig  Start Date End Date Taking? Authorizing Provider  albuterol (PROVENTIL HFA;VENTOLIN HFA) 108 (90 BASE) MCG/ACT inhaler Inhale 2 puffs into the lungs every 6 (six) hours as needed for wheezing or shortness of breath. 06/03/13  Yes Belkys A Regalado, MD  fluticasone (FLONASE) 50 MCG/ACT nasal spray Place 2 sprays into both nostrils daily. 06/03/13  Yes Belkys A Regalado, MD  gabapentin (NEURONTIN) 300 MG capsule Take 300 mg by mouth 2 (two) times daily.   Yes Historical Provider, MD  LEVEMIR FLEXTOUCH 100 UNIT/ML Pen Inject 12 Units into the skin at bedtime.  02/22/13  Yes Historical Provider, MD  lithium carbonate (LITHOBID) 300 MG CR tablet Take 1 tablet by mouth daily.  05/17/13  Yes Historical Provider, MD  meclizine (ANTIVERT) 12.5 MG tablet Take 12.5 mg by mouth 3 (three) times daily as needed. For dizziness   Yes Historical Provider, MD  morphine (MSIR) 30 MG tablet Take 30 mg by mouth every 12 (twelve) hours.   Yes Historical Provider, MD  potassium chloride SA (K-DUR,KLOR-CON) 20 MEQ tablet Take 20 mEq by mouth daily.   Yes Historical Provider, MD  simvastatin (ZOCOR) 10 MG tablet Take 1 tablet by mouth daily. 05/29/13  Yes Historical Provider, MD  topiramate (TOPAMAX) 25 MG tablet Take  25 mg by mouth daily.   Yes Historical Provider, MD  traZODone (DESYREL) 50 MG tablet Take 1 tablet by mouth daily. 05/23/13  Yes Historical Provider, MD  benzonatate (TESSALON) 100 MG capsule Take 1 capsule (100 mg total) by mouth 3 (three) times daily as needed for cough. Patient not taking: Reported on 04/02/2014 06/03/13   Belkys A Regalado, MD  diphenhydrAMINE (BENADRYL) 25 mg capsule Take 1 capsule (25 mg total) by mouth every 8 (eight) hours as needed for itching or allergies. 06/03/13   Belkys A Regalado, MD  HYDROcodone-acetaminophen (NORCO) 5-325 MG per tablet Take 1 tablet by mouth every 4 (four) hours as needed. 04/02/14   Richarda Blade, MD   BP 111/58 mmHg  Pulse 66  Temp(Src) 97.9 F (36.6 C) (Oral)   Resp 18  Ht 5\' 3"  (1.6 m)  Wt 229 lb (103.874 kg)  BMI 40.58 kg/m2  SpO2 99% Physical Exam  Constitutional: She is oriented to person, place, and time. She appears well-developed and well-nourished.  HENT:  Head: Normocephalic and atraumatic.  Right Ear: External ear normal.  Left Ear: External ear normal.  Eyes: Conjunctivae and EOM are normal. Pupils are equal, round, and reactive to light.  Neck: Normal range of motion and phonation normal. Neck supple.  Cardiovascular: Normal rate, regular rhythm and normal heart sounds.   Pulmonary/Chest: Effort normal and breath sounds normal. She exhibits no bony tenderness.  Abdominal: Soft. There is no tenderness.  Genitourinary:  No flank tenderness, to percussion.  Musculoskeletal: Normal range of motion.  Neurological: She is alert and oriented to person, place, and time. No cranial nerve deficit or sensory deficit. She exhibits normal muscle tone. Coordination normal.  Skin: Skin is warm, dry and intact.  Psychiatric: She has a normal mood and affect. Her behavior is normal. Judgment and thought content normal.  Nursing note and vitals reviewed.   ED Course  Procedures (including critical care time)  Medications  ondansetron (ZOFRAN) injection 4 mg (4 mg Intravenous Given 04/02/14 1229)  morphine 4 MG/ML injection 4 mg (4 mg Intravenous Given 04/02/14 1230)  sodium chloride 0.9 % bolus 500 mL (0 mLs Intravenous Stopped 04/02/14 1253)    Patient Vitals for the past 24 hrs:  BP Temp Temp src Pulse Resp SpO2 Height Weight  04/02/14 1710 111/58 mmHg - - 66 18 99 % - -  04/02/14 1630 105/70 mmHg - - - 15 - - -  04/02/14 1615 92/61 mmHg - - (!) 57 15 99 % - -  04/02/14 1614 92/61 mmHg - - 68 17 100 % - -  04/02/14 1500 111/80 mmHg - - - 17 - - -  04/02/14 1430 (!) 88/61 mmHg - - - 18 - - -  04/02/14 1330 117/74 mmHg - - 71 17 97 % - -  04/02/14 1301 112/76 mmHg - - 67 17 97 % - -  04/02/14 1232 95/70 mmHg - - 78 20 100 % - -  04/02/14  1130 96/57 mmHg - - 64 (!) 28 96 % - -  04/02/14 1100 95/59 mmHg - - 74 12 99 % - -  04/02/14 1034 - - - 85 19 97 % - -  04/02/14 1030 - 97.9 F (36.6 C) Oral - - - 5\' 3"  (1.6 m) 229 lb (103.874 kg)    4:33 PM Reevaluation with update and discussion. After initial assessment and treatment, an updated evaluation reveals she is more comfortable, now.  She has no further complaints.  Findings were discussed with the patient, all questions answered.. [place] METABOLIC PANEL - Abnormal; Notable for the following:    GFR calc non Af Amer 75 (*)    GFR calc Af Amer 87 (*)    All other components within normal limits  URINE CULTURE  CBC WITH DIFFERENTIAL  URINALYSIS, ROUTINE W REFLEX MICROSCOPIC    Imaging Review Ct Renal Stone Study  04/02/2014   CLINICAL DATA:  63 year old female presenting with history of right-sided flank pain.  EXAM: CT ABDOMEN AND PELVIS WITHOUT CONTRAST  TECHNIQUE: Multidetector CT imaging of the abdomen and pelvis was performed following the standard protocol without IV contrast.  COMPARISON:  No priors.  FINDINGS: Lower chest:  Unremarkable.  Hepatobiliary: Heterogeneous areas of low attenuation, most extensive within the right lobe of the liver, compatible with heterogeneous hepatic steatosis. No discrete cystic or solid hepatic lesions are identified. Status post cholecystectomy. Common bile duct is slightly prominent measuring 11 mm within the porta hepatis, which is likely chronic related to the patient's age and post cholecystectomy status. No radiopaque ductal stone identified.  Pancreas: Unremarkable.  Spleen: Unremarkable.  Adrenals/Urinary Tract: No urinary tract calculi are identified. No hydroureteronephrosis to indicate urinary tract obstruction at this time. The unenhanced appearance of the urinary bladder is unremarkable. Two lesions in the right kidney are incompletely characterized on today's non contrast CT  examination, but favored to represent small cysts, the largest of which measures 2 cm in the interpolar region. The unenhanced appearance of the adrenal glands is normal bilaterally.  Stomach/Bowel: The unenhanced appearance of the stomach is normal. No pathologic dilatation of small bowel or colon. A few scattered colonic diverticulae are noted, without surrounding inflammatory changes to suggest an acute diverticulitis at this time.  Vascular/Lymphatic: Atherosclerosis throughout the abdominal and pelvic vasculature, without evidence of aneurysm. No lymphadenopathy noted in the abdomen or pelvis on today's non contrast CT examination.  Reproductive: Status post hysterectomy. Ovaries are not confidently identified may be surgically absent or atrophic.  Other: No significant volume of ascites.  Musculoskeletal: No pneumoperitoneum. Superior endplate compression fracture of L1 with approximately 25% loss of anterior vertebral body height and prominent Schmorl's node incidentally noted. This does not appear to be acute. Left-sided posterior rod and screw fixation device at L5-S1 with interbody graft at the L5-S1 interspace. There are no aggressive appearing lytic or blastic lesions noted in the visualized portions of the skeleton.  IMPRESSION: 1. No acute findings in the abdomen or pelvis to account for the patient's symptoms. Specifically, no urinary tract calculi or findings of urinary tract obstruction are noted at this time. 2. Heterogeneous hepatic steatosis. 3. Atherosclerosis. 4. Status post cholecystectomy. There is mild extrahepatic biliary ductal dilatation, which is likely chronic in this post cholecystectomy patient (however, no prior studies are available for comparison). Correlation with liver function tests is recommended. If there is clinical concern for biliary tract obstruction, further evaluation with MRCP could provide additional diagnostic information if indicated. 5. Additional incidental  findings, as above.   Electronically Signed   By: Vinnie Langton M.D.   On: 04/02/2014 16:05     EKG Interpretation None      MDM   Final diagnoses:  Flank pain    Nonspecific flank pain without evidence for UTI, urolithiasis, acute fractures or intra-abdominal processes.   Nursing Notes Reviewed/ Care Coordinated Applicable Imaging Reviewed Interpretation of Laboratory Data incorporated into ED treatment  The patient appears  reasonably screened and/or stabilized for discharge and I doubt any other medical condition or other [facility] requiring further screening, evaluation, or treatment in the ED at this time prior to discharge.  Plan: Home Medications- Norco prn; Home Treatments- rest; return here if the recommended treatment, does not improve the symptoms; Recommended follow up- PCP check up in 1-2 weeks    Richarda Blade, MD 04/02/14 [phone]

## 2014-04-02 NOTE — Discharge Instructions (Signed)
Flank Pain °Flank pain refers to pain that is located on the side of the body between the upper abdomen and the back. The pain may occur over a short period of time (acute) or may be long-term or reoccurring (chronic). It may be mild or severe. Flank pain can be caused by many things. °CAUSES  °Some of the more common causes of flank pain include: °· Muscle strains.   °· Muscle spasms.   °· A disease of your spine (vertebral disk disease).   °· A lung infection (pneumonia).   °· Fluid around your lungs (pulmonary edema).   °· A kidney infection.   °· Kidney stones.   °· A very painful skin rash caused by the chickenpox virus (shingles).   °· Gallbladder disease.   °HOME CARE INSTRUCTIONS  °Home care will depend on the cause of your pain. In general, °· Rest as directed by your caregiver. °· Drink enough fluids to keep your urine clear or pale yellow. °· Only take over-the-counter or prescription medicines as directed by your caregiver. Some medicines may help relieve the pain. °· Tell your caregiver about any changes in your pain. °· Follow up with your caregiver as directed. °SEEK IMMEDIATE MEDICAL CARE IF:  °· Your pain is not controlled with medicine.   °· You have new or worsening symptoms. °· Your pain increases.   °· You have abdominal pain.   °· You have shortness of breath.   °· You have persistent nausea or vomiting.   °· You have swelling in your abdomen.   °· You feel faint or pass out.   °· You have blood in your urine. °· You have a fever or persistent symptoms for more than 2-3 days. °· You have a fever and your symptoms suddenly get worse. °MAKE SURE YOU:  °· Understand these instructions. °· Will watch your condition. °· Will get help right away if you are not doing well or get worse. °Document Released: 06/06/2005 Document Revised: 01/08/2012 Document Reviewed: 11/28/2011 °[employer]® Patient Information ©2015 [employer]. This information is not intended to replace advice given to you by your  health care provider. Make sure you discuss any questions you have with your health care provider. ° °

## 2014-04-03 LAB — URINE CULTURE
Colony Count: 50000
SPECIAL REQUESTS: NORMAL

## 2014-08-19 ENCOUNTER — Ambulatory Visit: Payer: Medicare Other | Admitting: Pulmonary Disease

## 2014-08-23 ENCOUNTER — Ambulatory Visit (INDEPENDENT_AMBULATORY_CARE_PROVIDER_SITE_OTHER): Payer: Medicare Other | Admitting: Pulmonary Disease

## 2014-08-23 ENCOUNTER — Encounter: Payer: Self-pay | Admitting: Pulmonary Disease

## 2014-08-23 VITALS — BP 132/76 | HR 88 | Temp 98.9°F | Ht 63.0 in | Wt 226.0 lb

## 2014-08-23 DIAGNOSIS — G4733 Obstructive sleep apnea (adult) (pediatric): Secondary | ICD-10-CM | POA: Diagnosis not present

## 2014-08-23 NOTE — Progress Notes (Signed)
   Subjective:    Patient ID: Melanie Reynolds, female    DOB: 04/28/1951, 64 y.o.   MRN: [id number]  HPI The patient comes in today for follow-up of her obstructive sleep apnea. She is wearing C Pap compliantly by her download, and feels that she is doing very well with the device. She is satisfied with her sleep and daytime alertness, and has no sleepiness with driving or quiet activities during the day. She is having no issues with her mask fit or pressure.   Review of Systems  Constitutional: Negative for fever and unexpected weight change.  HENT: Negative for congestion, dental problem, ear pain, nosebleeds, postnasal drip, rhinorrhea, sinus pressure, sneezing, sore throat and trouble swallowing.   Eyes: Negative for redness and itching.  Respiratory: Negative for cough, chest tightness, shortness of breath and wheezing.   Cardiovascular: Negative for palpitations and leg swelling.  Gastrointestinal: Negative for nausea and vomiting.  Genitourinary: Negative for dysuria.  Musculoskeletal: Negative for joint swelling.  Skin: Negative for rash.  Neurological: Negative for headaches.  Hematological: Does not bruise/bleed easily.  Psychiatric/Behavioral: Negative for dysphoric mood. The patient is not nervous/anxious.        Objective:   Physical Exam Obese female in no acute distress Nose without purulence or discharge noted Neck without lymphadenopathy or thyromegaly No skin breakdown or pressure necrosis from the C Pap mask Lower extremities with edema noted, no cyanosis Alert and oriented, moves all 4 extremities.       Assessment & Plan:

## 2014-08-23 NOTE — Assessment & Plan Note (Signed)
The patient is doing very well on her current C Pap set up, and her downloaded shows good compliance and good control of her AHI. Stricter to continue on her device, and work aggressively on weight loss. I've also asked her to keep up with her mask cushion changes and supplies.

## 2014-08-23 NOTE — Patient Instructions (Signed)
Continue on cpap, and keep up with mask changes and supplies. Work on weight loss followup again in one year

## 2015-08-29 ENCOUNTER — Ambulatory Visit (INDEPENDENT_AMBULATORY_CARE_PROVIDER_SITE_OTHER): Payer: Medicare Other | Admitting: Pulmonary Disease

## 2015-08-29 ENCOUNTER — Telehealth: Payer: Self-pay | Admitting: Pulmonary Disease

## 2015-08-29 ENCOUNTER — Encounter: Payer: Self-pay | Admitting: Pulmonary Disease

## 2015-08-29 ENCOUNTER — Ambulatory Visit (INDEPENDENT_AMBULATORY_CARE_PROVIDER_SITE_OTHER)
Admission: RE | Admit: 2015-08-29 | Discharge: 2015-08-29 | Disposition: A | Discharge status: Home / Self Care | Payer: Medicare Other | Source: Ambulatory Visit | Attending: Pulmonary Disease | Admitting: Pulmonary Disease

## 2015-08-29 VITALS — BP 114/68 | HR 76 | Ht 63.0 in | Wt 227.0 lb

## 2015-08-29 DIAGNOSIS — R0600 Dyspnea, unspecified: Secondary | ICD-10-CM | POA: Diagnosis not present

## 2015-08-29 DIAGNOSIS — G4733 Obstructive sleep apnea (adult) (pediatric): Secondary | ICD-10-CM | POA: Diagnosis not present

## 2015-08-29 DIAGNOSIS — E669 Obesity, unspecified: Secondary | ICD-10-CM

## 2015-08-29 DIAGNOSIS — R0609 Other forms of dyspnea: Secondary | ICD-10-CM | POA: Diagnosis not present

## 2015-08-29 DIAGNOSIS — Z720 Tobacco use: Secondary | ICD-10-CM | POA: Insufficient documentation

## 2015-08-29 NOTE — Assessment & Plan Note (Signed)
Advised on smoking cessation.  Down to 1/2 PPD

## 2015-08-29 NOTE — Assessment & Plan Note (Signed)
SOB with more than ADLs.  Likely with copd, mild. Uses alb prn. Told pt to call if more SOB.  CXR today.

## 2015-08-29 NOTE — Assessment & Plan Note (Signed)
Weight reduction 

## 2015-08-29 NOTE — Patient Instructions (Signed)
1. Continue using cpap. 2. We will get a chest Xray. Will call you with results.  Return to clinic in 1 yr.

## 2015-08-29 NOTE — Assessment & Plan Note (Addendum)
NPSG 05/2013:  AHI 23/hr  Got cpap in 2015.  On CPAP 10 cm H2O. Feels better using it. More energy. Less sleepiness. DL for 06/2015 63%, AHI 4.6.

## 2015-08-29 NOTE — Progress Notes (Signed)
   Subjective:    Patient ID: Melanie Reynolds, female    DOB: November 25, 1950, 65 y.o.   MRN: [id number]  HPI Pt is here for f/u on her OSA.  ROV (08/29/15) Pt returns to the office as follow-up on her sleep apnea. Since last seen a year ago, she uses her CPAP machine.No issues with it. Feels better with it.Feels benefit of CPAP. Last download for the month shows 63% compliance, AHI 4.6.   Review of Systems  Constitutional: Negative.   HENT: Negative.   Eyes: Negative.   Respiratory: Positive for shortness of breath.   Cardiovascular: Negative.   Gastrointestinal: Negative.   Endocrine: Negative.   Genitourinary: Negative.   Musculoskeletal: Negative.   Skin: Negative.   Allergic/Immunologic: Negative.   Neurological: Negative.   Hematological: Negative.   Psychiatric/Behavioral: Negative.   All other systems reviewed and are negative.      Objective:   Physical Exam  Vitals:  Filed Vitals:   08/29/15 0920  BP: 114/68  Pulse: 76  Height: 5\' 3"  (1.6 m)  Weight: 227 lb (102.967 kg)  SpO2: 99%    Constitutional/General:  Pleasant, well-nourished, well-developed, not in any distress,  Comfortably seating.  Well kempt  Body mass index is 40.22 kg/(m^2). Wt Readings from Last 3 Encounters:  08/29/15 227 lb (102.967 kg)  08/23/14 226 lb (102.513 kg)  04/02/14 229 lb (103.874 kg)    Neck circumference:   HEENT: Pupils equal and reactive to light and accommodation. Anicteric sclerae. Normal nasal mucosa.   No oral  lesions,  mouth clear,  oropharynx clear, no postnasal drip. (-) Oral thrush. No dental caries.  Airway - Mallampati class III  Neck: No masses. Midline trachea. No JVD, (-) LAD. (-) bruits appreciated.  Respiratory/Chest: Grossly normal chest. (-) deformity. (-) Accessory muscle use.  Symmetric expansion. (-) Tenderness on palpation.  Resonant on percussion.  Diminished BS on both lower lung zones. (-) wheezing, crackles, rhonchi (-)  egophony  Cardiovascular: Regular rate and  rhythm, heart sounds normal, no murmur or gallops, no peripheral edema  Gastrointestinal:  Normal bowel sounds. Soft, non-tender. No hepatosplenomegaly.  (-) masses.   Musculoskeletal:  Normal muscle tone. Normal gait.   Extremities: Grossly normal. (-) clubbing, cyanosis.  (-) edema  Skin: (-) rash,lesions seen.   Neurological/Psychiatric : alert, oriented to time, place, person. Normal mood and affect            Assessment & Plan:  OSA (obstructive sleep apnea) NPSG 05/2013:  AHI 23/hr  Got cpap in 2015.  On CPAP 10 cm H2O. Feels better using it. More energy. Less sleepiness. DL for 06/2015 63%, AHI 4.6.   Exertional dyspnea SOB with more than ADLs.  Likely with copd, mild. Uses alb prn. Told pt to call if more SOB.  CXR today.   Tobacco user Advised on smoking cessation.  Down to 1/2 PPD  Obesity Weight reduction   Return to clinic in 1 yr.  Monica Becton, MD 08/29/2015, 9:32 AM [facility] Pager [phone] After 3 pm or if no answer, call [phone]

## 2015-08-29 NOTE — Telephone Encounter (Signed)
Notes Recorded by Rush Landmark, MD on 08/29/2015 at 10:41 AM pls tell pt CXR was OK. tnx ------------- lmtcb X1 for pt

## 2015-08-30 NOTE — Telephone Encounter (Signed)
LMTCB

## 2015-08-30 NOTE — Telephone Encounter (Signed)
Spoke with Melanie Reynolds and notified of cxr results  She verbalized understanding and nothing further needed

## 2015-09-06 ENCOUNTER — Other Ambulatory Visit: Payer: Self-pay | Admitting: Internal Medicine

## 2015-09-06 ENCOUNTER — Other Ambulatory Visit: Payer: Self-pay

## 2015-09-06 DIAGNOSIS — Z1231 Encounter for screening mammogram for malignant neoplasm of breast: Secondary | ICD-10-CM

## 2015-09-19 ENCOUNTER — Ambulatory Visit
Admission: RE | Admit: 2015-09-19 | Discharge: 2015-09-19 | Disposition: A | Discharge status: Home / Self Care | Payer: Medicare Other | Source: Ambulatory Visit

## 2015-09-19 DIAGNOSIS — Z1231 Encounter for screening mammogram for malignant neoplasm of breast: Secondary | ICD-10-CM

## 2015-11-04 ENCOUNTER — Encounter (HOSPITAL_COMMUNITY): Payer: Self-pay | Admitting: Emergency Medicine

## 2015-11-04 ENCOUNTER — Emergency Department (HOSPITAL_COMMUNITY)
Admission: EM | Admit: 2015-11-04 | Discharge: 2015-11-05 | Disposition: A | Discharge status: Home / Self Care | Payer: Medicare Other | Attending: Emergency Medicine | Admitting: Emergency Medicine

## 2015-11-04 ENCOUNTER — Emergency Department (HOSPITAL_COMMUNITY): Payer: Medicare Other

## 2015-11-04 DIAGNOSIS — Z91040 Latex allergy status: Secondary | ICD-10-CM | POA: Diagnosis not present

## 2015-11-04 DIAGNOSIS — R519 Headache, unspecified: Secondary | ICD-10-CM

## 2015-11-04 DIAGNOSIS — E119 Type 2 diabetes mellitus without complications: Secondary | ICD-10-CM | POA: Diagnosis not present

## 2015-11-04 DIAGNOSIS — Z79899 Other long term (current) drug therapy: Secondary | ICD-10-CM | POA: Insufficient documentation

## 2015-11-04 DIAGNOSIS — Z5181 Encounter for therapeutic drug level monitoring: Secondary | ICD-10-CM | POA: Diagnosis not present

## 2015-11-04 DIAGNOSIS — R51 Headache: Secondary | ICD-10-CM | POA: Diagnosis present

## 2015-11-04 DIAGNOSIS — F17210 Nicotine dependence, cigarettes, uncomplicated: Secondary | ICD-10-CM | POA: Insufficient documentation

## 2015-11-04 DIAGNOSIS — R42 Dizziness and giddiness: Secondary | ICD-10-CM

## 2015-11-04 LAB — I-STAT CHEM 8, ED
BUN: 11 mg/dL (ref 6–20)
CREATININE: 0.8 mg/dL (ref 0.44–1.00)
Calcium, Ion: 1.21 mmol/L (ref 1.12–1.23)
Chloride: 104 mmol/L (ref 101–111)
Glucose, Bld: 163 mg/dL — ABNORMAL HIGH (ref 65–99)
HEMATOCRIT: 48 % — AB (ref 36.0–46.0)
HEMOGLOBIN: 16.3 g/dL — AB (ref 12.0–15.0)
POTASSIUM: 4 mmol/L (ref 3.5–5.1)
Sodium: 142 mmol/L (ref 135–145)
TCO2: 24 mmol/L (ref 0–100)

## 2015-11-04 LAB — DIFFERENTIAL
BASOS ABS: 0.2 10*3/uL — AB (ref 0.0–0.1)
BASOS PCT: 2 %
Eosinophils Absolute: 0 10*3/uL (ref 0.0–0.7)
Eosinophils Relative: 0 %
Lymphocytes Relative: 32 %
Lymphs Abs: 2.7 10*3/uL (ref 0.7–4.0)
Monocytes Absolute: 0.5 10*3/uL (ref 0.1–1.0)
Monocytes Relative: 6 %
NEUTROS ABS: 4.9 10*3/uL (ref 1.7–7.7)
Neutrophils Relative %: 60 %

## 2015-11-04 LAB — COMPREHENSIVE METABOLIC PANEL
ALT: 24 U/L (ref 14–54)
AST: 24 U/L (ref 15–41)
Albumin: 3.4 g/dL — ABNORMAL LOW (ref 3.5–5.0)
Alkaline Phosphatase: 79 U/L (ref 38–126)
Anion gap: 8 (ref 5–15)
BUN: 10 mg/dL (ref 6–20)
CHLORIDE: 108 mmol/L (ref 101–111)
CO2: 23 mmol/L (ref 22–32)
CREATININE: 0.97 mg/dL (ref 0.44–1.00)
Calcium: 9.4 mg/dL (ref 8.9–10.3)
GFR calc Af Amer: >60 mL/min (ref >60)
GFR calc non Af Amer: >60 mL/min (ref >60)
Glucose, Bld: 166 mg/dL — ABNORMAL HIGH (ref 65–99)
Potassium: 4.2 mmol/L (ref 3.5–5.1)
SODIUM: 139 mmol/L (ref 135–145)
Total Bilirubin: 0.4 mg/dL (ref 0.3–1.2)
Total Protein: 6.9 g/dL (ref 6.5–8.1)

## 2015-11-04 LAB — CBC
HEMATOCRIT: 45 % (ref 36.0–46.0)
Hemoglobin: 14.4 g/dL (ref 12.0–15.0)
MCH: 29.4 pg (ref 26.0–34.0)
MCHC: 32 g/dL (ref 30.0–36.0)
MCV: 92 fL (ref 78.0–100.0)
PLATELETS: 288 10*3/uL (ref 150–400)
RBC: 4.89 MIL/uL (ref 3.87–5.11)
RDW: 14 % (ref 11.5–15.5)
WBC: 8.3 10*3/uL (ref 4.0–10.5)

## 2015-11-04 LAB — CBG MONITORING, ED: Glucose-Capillary: 116 mg/dL — ABNORMAL HIGH (ref 65–99)

## 2015-11-04 LAB — I-STAT TROPONIN, ED: Troponin i, poc: 0 ng/mL (ref 0.00–0.08)

## 2015-11-04 LAB — PROTIME-INR
INR: 1.03 (ref 0.00–1.49)
Prothrombin Time: 13.7 seconds (ref 11.6–15.2)

## 2015-11-04 LAB — APTT: aPTT: 24 seconds (ref 24–37)

## 2015-11-04 MED ORDER — DIPHENHYDRAMINE HCL 50 MG/ML IJ SOLN
25.0000 mg | Freq: Once | INTRAMUSCULAR | Status: AC
  Administered 2015-11-04: 25 mg via INTRAVENOUS
  Filled 2015-11-04: qty 1

## 2015-11-04 MED ORDER — SODIUM CHLORIDE 0.9 % IV BOLUS (SEPSIS)
1000.0000 mL | Freq: Once | INTRAVENOUS | Status: AC
  Administered 2015-11-04: 1000 mL via INTRAVENOUS

## 2015-11-04 MED ORDER — PROCHLORPERAZINE EDISYLATE 5 MG/ML IJ SOLN
10.0000 mg | Freq: Once | INTRAMUSCULAR | Status: AC
  Administered 2015-11-04: 10 mg via INTRAVENOUS
  Filled 2015-11-04: qty 2

## 2015-11-04 MED ORDER — IOPAMIDOL (ISOVUE-370) INJECTION 76%
INTRAVENOUS | Status: AC
  Administered 2015-11-04: 75 mL
  Filled 2015-11-04: qty 100

## 2015-11-04 NOTE — ED Provider Notes (Signed)
CSN: [id number]     Arrival date & time 11/04/15  1716 History   First MD Initiated Contact with Patient 11/04/15 1747     Chief Complaint  Patient presents with  . Code Stroke    @EDPCLEARED @ (Consider location/radiation/quality/duration/timing/severity/associated sxs/prior Treatment) HPI Comments: 65 year old female with past medical history including type 2 diabetes mellitus, hyperlipidemia, anxiety/depression who presents with headache and dizziness. The patient was eating lunch at home after running and and around 3 PM she began feeling dizzy which she describes as both a lightheadedness and a spinning sensation. She started having some pressure and tightness in her neck as well as diaphoresis and nausea. She states "I felt like I was going to die." She does endorse some associated shortness of breath but no chest pain. She then had a gradual onset of right-sided headache. The headache is now severe. She denies any sudden onset of thunderclap headache. She still has some of the neck pressure and dizziness sensation. She has never had these symptoms before. She has a distant history of vertigo but has never had anything like this. No visual changes, extremity weakness or numbness, or problems with speech. They called EMS and a stroke alert was called in route.  The history is provided by the patient.    Past Medical History  Diagnosis Date  . Type II diabetes mellitus ([place])   . High cholesterol   . Pneumonia 1970's; 1980's; 06/01/2013    "total of 3 times"  . OSA (obstructive sleep apnea)   . Arthritis     "knees, legs, back" (06/01/2013)  . Chronic lower back pain   . Bipolar depression ([place])   . Anxiety    Past Surgical History  Procedure Laterality Date  . Cholecystectomy  1990's  . Abdominal hysterectomy  1982  . Tubal ligation  1978  . Back surgery    . Posterior lumbar fusion  2000's  . Implantation vagal nerve stimulator  2013  . Vagal nerve stimulator removal  2013    "got  an infection in there" (06/01/2013)  . Forearm fracture surgery Left 1999    "put in plates"  . Shoulder open rotator cuff repair Right 2000   Family History  Problem Relation Age of Onset  . Kidney failure Mother    Social History  Substance Use Topics  . Smoking status: Current Every Day Smoker -- 0.50 packs/day for 45 years    Types: Cigarettes  . Smokeless tobacco: Never Used     Comment: h/o 1 PPD  . Alcohol Use: Yes     Comment: 06/01/2013 "glass of wine maybe q 6 months, if that"   OB History    No data available     Review of Systems 10 Systems reviewed and are negative for acute change except as noted in the HPI.   Allergies  Latex and Naproxen  Home Medications   Prior to Admission medications   Medication Sig Start Date End Date Taking? Authorizing Provider  albuterol (PROVENTIL HFA;VENTOLIN HFA) 108 (90 BASE) MCG/ACT inhaler Inhale 2 puffs into the lungs every 6 (six) hours as needed for wheezing or shortness of breath. 06/03/13   Belkys A Regalado, MD  Cetirizine HCl (ZYRTEC ALLERGY) 10 MG CAPS Take 1 tablet by mouth daily.    Historical Provider, MD  diphenhydrAMINE (BENADRYL) 25 mg capsule Take 1 capsule (25 mg total) by mouth every 8 (eight) hours as needed for itching or allergies. 06/03/13   Elmarie Shiley, MD  fluticasone (FLONASE)  50 MCG/ACT nasal spray Place 2 sprays into both nostrils daily. 06/03/13   Belkys A Regalado, MD  furosemide (LASIX) 20 MG tablet Take 1 tablet by mouth daily.    Historical Provider, MD  HYDROcodone-acetaminophen (NORCO) 5-325 MG per tablet Take 1 tablet by mouth every 4 (four) hours as needed. 04/02/14   Daleen Bo, MD  lithium carbonate (LITHOBID) 300 MG CR tablet Take 1 tablet by mouth daily.  05/17/13   Historical Provider, MD  meclizine (ANTIVERT) 12.5 MG tablet Take 12.5 mg by mouth 3 (three) times daily as needed. For dizziness    Historical Provider, MD  morphine (MSIR) 30 MG tablet Take 30 mg by mouth every 12 (twelve) hours.     Historical Provider, MD  potassium chloride SA (K-DUR,KLOR-CON) 20 MEQ tablet Take 20 mEq by mouth daily.    Historical Provider, MD  pregabalin (LYRICA) 75 MG capsule Take 75 mg by mouth 3 (three) times daily.    Historical Provider, MD  simvastatin (ZOCOR) 10 MG tablet Take 1 tablet by mouth daily. 05/29/13   Historical Provider, MD  topiramate (TOPAMAX) 25 MG tablet Take 25 mg by mouth daily.    Historical Provider, MD  traZODone (DESYREL) 50 MG tablet Take 1 tablet by mouth daily. 05/23/13   Historical Provider, MD  VICTOZA 18 MG/3ML SOPN daily. 07/29/14   Historical Provider, MD   BP 121/89 mmHg  Pulse 69  Temp(Src) 97.7 F (36.5 C) (Oral)  Resp 18  SpO2 92% Physical Exam  Constitutional: She is oriented to person, place, and time. She appears well-developed and well-nourished. No distress.  Eyes closed, uncomfortable  HENT:  Head: Normocephalic and atraumatic.  Eyes: Conjunctivae and EOM are normal. Pupils are equal, round, and reactive to light.  Neck: Neck supple.  Cardiovascular: Normal rate, regular rhythm and normal heart sounds.   No murmur heard. Pulmonary/Chest: Effort normal and breath sounds normal. No respiratory distress.  Abdominal: Soft. Bowel sounds are normal. She exhibits no distension.  Musculoskeletal: She exhibits no edema.  Neurological: She is alert and oriented to person, place, and time. She has normal reflexes. No cranial nerve deficit. She exhibits normal muscle tone.  Fluent speech, normal finger-to-nose testing, negative pronator drift, no clonus 5/5 strength and normal sensation x all 4 extremities  Skin: Skin is warm and dry.  Psychiatric: She has a normal mood and affect. Judgment and thought content normal.  Nursing note and vitals reviewed.   ED Course  Procedures (including critical care time) Labs Review Labs Reviewed  DIFFERENTIAL - Abnormal; Notable for the following:    Basophils Absolute 0.2 (*)    All other components within normal  limits  COMPREHENSIVE METABOLIC PANEL - Abnormal; Notable for the following:    Glucose, Bld 166 (*)    Albumin 3.4 (*)    All other components within normal limits  CBG MONITORING, ED - Abnormal; Notable for the following:    Glucose-Capillary 116 (*)    All other components within normal limits  I-STAT CHEM 8, ED - Abnormal; Notable for the following:    Glucose, Bld 163 (*)    Hemoglobin 16.3 (*)    HCT 48.0 (*)    All other components within normal limits  PROTIME-INR  APTT  CBC  I-STAT TROPOININ, ED  I-STAT TROPOININ, ED    Imaging Review Ct Angio Head W/cm &/or Wo Cm  11/04/2015  CLINICAL DATA:  Initial evaluation for acute headache, dizziness. EXAM: CT ANGIOGRAPHY HEAD AND NECK TECHNIQUE: Multidetector  CT imaging of the head and neck was performed using the standard protocol during bolus administration of intravenous contrast. Multiplanar CT image reconstructions and MIPs were obtained to evaluate the vascular anatomy. Carotid stenosis measurements (when applicable) are obtained utilizing NASCET criteria, using the distal internal carotid diameter as the denominator. CONTRAST:  75 cc of Isovue 370. COMPARISON:  Prior CT from earlier the same day. FINDINGS: CTA NECK Aortic arch: Visualized aortic arch is of normal caliber with normal branch pattern. Minimal eccentric plaque at the origin of the right brachiocephalic artery. No high-grade stenosis at the origin of the great vessels. Visualized subclavian arteries widely patent. Right carotid system: Right common carotid artery patent from its origin to the bifurcation. Mild atheromatous irregularity about the right bifurcation/proximal right ICA without high-grade stenosis. Approximately 30% stenosis by NASCET criteria Right ICA patent distally to the skullbase without stenosis, dissection, or occlusion. Right external carotid artery and its branches within normal limits. Left carotid system: Left common carotid artery patent from its  origin to the bifurcation. Mild calcified noncalcified plaque about the left bifurcation/proximal left ICA without high-grade stenosis. Left ICA patent distally to the skullbase without stenosis, dissection, or occlusion. Left external carotid artery is branches within normal limits. Vertebral arteries:Both vertebral arteries arise from the subclavian arteries. Vertebral arteries patent without stenosis, dissection, or occlusion. No made of duplication of the left V3/ V4 segment, a normal anatomic variant. Skeleton: No acute osseous abnormality. No worrisome lytic or blastic osseous lesion. Moderate multilevel degenerative spondylolysis, most prevalent at C6-7. Other neck: Visualized lungs are clear. Visualized superior mediastinum within normal limits. Thyroid gland normal. No adenopathy within the neck. No acute soft tissue abnormality. CTA HEAD Anterior circulation: Petrous segments patent bilaterally. Scattered calcified atheromatous plaque within the cavernous ICAs with mild multi focal narrowing. Supraclinoid segments patent. A1 segments patent bilaterally. Right A1 segment hypoplastic. Anterior communicating artery normal. Anterior cerebral arteries opacified bilaterally. M1 segments patent without stenosis or occlusion. MCA bifurcations normal. Distal MCA branches well opacified and symmetric. Posterior circulation: Vertebral arteries patent to the vertebrobasilar junction. Posterior inferior cerebral arteries patent. Basilar artery mildly irregular but patent to its distal aspect. Superior cerebellar arteries patent bilaterally. Left PCA arises from the basilar artery and is well opacified to its distal aspect. Hypoplastic right P1 segment. Prominent right posterior communicating artery supplying the right PCA. Right PCA also supply to its distal aspect. Venous sinuses: Patent without evidence for venous sinus thrombosis. Anatomic variants: Hypoplastic right P1 segment. 2-3 mm focal outpouching at the  anterior communicating artery complex noted, may reflect a possible small aneurysm first is tortuous vessel, not well evaluated on this study due to artifact through this region. No other aneurysm or vascular malformation. Delayed phase: No pathologic enhancement. IMPRESSION: CTA NECK IMPRESSION: 1. No severe or critical stenosis identified within the neck. 2. Atheromatous plaque at the right carotid bifurcation/proximal right ICA with associated short-segment stenosis of up to 30% by NASCET criteria. 3. Mild atheromatous plaque at the left carotid bifurcation/proximal left ICA without significant stenosis. 4. Patent vertebral arteries within the neck. CTA HEAD IMPRESSION: 1. Negative for emergent large vessel occlusion. No high-grade or correctable stenosis. 2. Atheromatous plaque within the cavernous ICAs bilaterally with mild multi focal narrowing. 3. Question 2-3 mm focal outpouching at the left anterior communicating artery complex, which may reflect a tiny aneurysm versus tortuous vessel (favored), not well evaluated on this exam due to motion. A short interval followup study to re-evaluate this region is suggested for complete  evaluation. Electronically Signed   By: Jeannine Boga M.D.   On: 11/04/2015 22:10   Ct Angio Neck W Or Wo Contrast  11/04/2015  CLINICAL DATA:  Initial evaluation for acute headache, dizziness. EXAM: CT ANGIOGRAPHY HEAD AND NECK TECHNIQUE: Multidetector CT imaging of the head and neck was performed using the standard protocol during bolus administration of intravenous contrast. Multiplanar CT image reconstructions and MIPs were obtained to evaluate the vascular anatomy. Carotid stenosis measurements (when applicable) are obtained utilizing NASCET criteria, using the distal internal carotid diameter as the denominator. CONTRAST:  75 cc of Isovue 370. COMPARISON:  Prior CT from earlier the same day. FINDINGS: CTA NECK Aortic arch: Visualized aortic arch is of normal caliber with  normal branch pattern. Minimal eccentric plaque at the origin of the right brachiocephalic artery. No high-grade stenosis at the origin of the great vessels. Visualized subclavian arteries widely patent. Right carotid system: Right common carotid artery patent from its origin to the bifurcation. Mild atheromatous irregularity about the right bifurcation/proximal right ICA without high-grade stenosis. Approximately 30% stenosis by NASCET criteria Right ICA patent distally to the skullbase without stenosis, dissection, or occlusion. Right external carotid artery and its branches within normal limits. Left carotid system: Left common carotid artery patent from its origin to the bifurcation. Mild calcified noncalcified plaque about the left bifurcation/proximal left ICA without high-grade stenosis. Left ICA patent distally to the skullbase without stenosis, dissection, or occlusion. Left external carotid artery is branches within normal limits. Vertebral arteries:Both vertebral arteries arise from the subclavian arteries. Vertebral arteries patent without stenosis, dissection, or occlusion. No made of duplication of the left V3/ V4 segment, a normal anatomic variant. Skeleton: No acute osseous abnormality. No worrisome lytic or blastic osseous lesion. Moderate multilevel degenerative spondylolysis, most prevalent at C6-7. Other neck: Visualized lungs are clear. Visualized superior mediastinum within normal limits. Thyroid gland normal. No adenopathy within the neck. No acute soft tissue abnormality. CTA HEAD Anterior circulation: Petrous segments patent bilaterally. Scattered calcified atheromatous plaque within the cavernous ICAs with mild multi focal narrowing. Supraclinoid segments patent. A1 segments patent bilaterally. Right A1 segment hypoplastic. Anterior communicating artery normal. Anterior cerebral arteries opacified bilaterally. M1 segments patent without stenosis or occlusion. MCA bifurcations normal. Distal  MCA branches well opacified and symmetric. Posterior circulation: Vertebral arteries patent to the vertebrobasilar junction. Posterior inferior cerebral arteries patent. Basilar artery mildly irregular but patent to its distal aspect. Superior cerebellar arteries patent bilaterally. Left PCA arises from the basilar artery and is well opacified to its distal aspect. Hypoplastic right P1 segment. Prominent right posterior communicating artery supplying the right PCA. Right PCA also supply to its distal aspect. Venous sinuses: Patent without evidence for venous sinus thrombosis. Anatomic variants: Hypoplastic right P1 segment. 2-3 mm focal outpouching at the anterior communicating artery complex noted, may reflect a possible small aneurysm first is tortuous vessel, not well evaluated on this study due to artifact through this region. No other aneurysm or vascular malformation. Delayed phase: No pathologic enhancement. IMPRESSION: CTA NECK IMPRESSION: 1. No severe or critical stenosis identified within the neck. 2. Atheromatous plaque at the right carotid bifurcation/proximal right ICA with associated short-segment stenosis of up to 30% by NASCET criteria. 3. Mild atheromatous plaque at the left carotid bifurcation/proximal left ICA without significant stenosis. 4. Patent vertebral arteries within the neck. CTA HEAD IMPRESSION: 1. Negative for emergent large vessel occlusion. No high-grade or correctable stenosis. 2. Atheromatous plaque within the cavernous ICAs bilaterally with mild multi focal narrowing. 3.  Question 2-3 mm focal outpouching at the left anterior communicating artery complex, which may reflect a tiny aneurysm versus tortuous vessel (favored), not well evaluated on this exam due to motion. A short interval followup study to re-evaluate this region is suggested for complete evaluation. Electronically Signed   By: Jeannine Boga M.D.   On: 11/04/2015 22:10   Ct Head Code Stroke W/o Cm  11/04/2015   CLINICAL DATA:  Severe headache and dizziness. EXAM: CT HEAD WITHOUT CONTRAST TECHNIQUE: Contiguous axial images were obtained from the base of the skull through the vertex without intravenous contrast. COMPARISON:  None. FINDINGS: No acute intracranial hemorrhage. No focal mass lesion. No CT evidence of acute infarction. No midline shift or mass effect. No hydrocephalus. Basilar cisterns are patent. Paranasal sinuses and mastoid air cells are clear. Orbits are clear. IMPRESSION: Normal head CT for age. Findings initiated toOster, MDon 11/04/2015  at17:35. Electronically Signed   By: Suzy Bouchard M.D.   On: 11/04/2015 17:36   I have personally reviewed and evaluated these lab results as part of my medical decision-making.   EKG Interpretation   Date/Time:  Saturday November 04 2015 17:31:49 EDT Ventricular Rate:  82 PR Interval:    QRS Duration: 90 QT Interval:  408 QTC Calculation: 477 R Axis:   91 Text Interpretation:  Sinus rhythm Right axis deviation Low voltage,  precordial leads No significant change since last tracing Confirmed by  Reginna Sermeno MD, Tome Wilson [phone]) on 11/04/2015 5:48:16 PM     Medications  sodium chloride 0.9 % bolus 1,000 mL (0 mLs Intravenous Stopped 11/04/15 2059)  diphenhydrAMINE (BENADRYL) injection 25 mg (25 mg Intravenous Given 11/04/15 1916)  prochlorperazine (COMPAZINE) injection 10 mg (10 mg Intravenous Given 11/04/15 1918)  iopamidol (ISOVUE-370) 76 % injection (75 mLs  Contrast Given 11/04/15 2004)    MDM   Final diagnoses:  Headache  Dizziness   Patient presents with episode of dizziness, neck tightness, diaphoresis, and nausea followed by gradual onset of headache. Stroke alert called in route and patient taken immediately to CT scanner. She was awake and alert, uncomfortable but GCS of 15 on arrival. Evaluated by neurology and stroke alert canceled. CT negative for acute process. On exam, she was uncomfortable due to headache but normal neurologic exam. EKG  unchanged from previous. Obtained above lab work which was unremarkable. Because of the patient's complaints of dizziness and neck tightness, obtained CTA of head and neck. Gave migraine cocktail with Benadryl and Compazine.  CTA shows no acute findings, possible outpouching of the left ACA, likely tortuous vessel versus tiny aneurysm. Recommended short-term follow-up study which I have discussed with the patient and included in her paperwork. On reexamination, the patient was resting comfortably and stated that her headache was much improved. Her lab work including serial troponins has been reassuring. No chest pain or shortness of breath and no EKG changes to suggest cardiac etiology of her symptoms. I discussed with neurology, Dr. Leonel Ramsay, who had discussed previously with Dr. Shon Hale who evaluated pt initially. Based on improvement in headache and normal neurologic exam at presentation, I feel patient is safe for discharge. Discussed supportive care and extensively reviewed return precautions. Patient discharged in satisfactory condition.   Sharlett Iles, MD 11/05/15 [phone]

## 2015-11-04 NOTE — Consult Note (Signed)
Neurology Consult Note  Reason for Consultation: CODE STROKE  Requesting provider: EMS/[facility]   CC: headache, dizziness, nausea  HPI: This is a 65 year old right-handed woman who is brought into the emergency department by ambulance as a code stroke. History is obtained directly from the patient who is a good historian.  She reports that she has been her usual state of health until earlier this afternoon. She had eaten some lunch and then went back home. She lay down and when she woke up she had a headache with severe nausea. This was accompanied by some diaphoresis and a sense of dizziness. She denies any actual vertigo and never had a sense of spinning or movement. She is not complaining of a right frontal headache that is very severe in nature, 10 out of 10 in intensity. She also endorses some photophobia and phonophobia. She is not reporting any weakness, numbness, tingling, vision changes, difficulty talking, difficulty swallowing. She denies any history of migraine headache. She does have a history of vertigo but says that that is much different than what she is experiencing right now. She has never had any similar episodes in the past.  Code stroke was canceled following assessment. NIH stroke scale score is 0. Presentation is not consistent with acute ischemic stroke.  Last known well: 1500 NIHSS score: 0 TPA given: No, due to low NIHSS score   PMH:  Past Medical History  Diagnosis Date  . Type II diabetes mellitus ([place])   . High cholesterol   . Pneumonia 1970's; 1980's; 06/01/2013    "total of 3 times"  . OSA (obstructive sleep apnea)   . Arthritis     "knees, legs, back" (06/01/2013)  . Chronic lower back pain   . Bipolar depression ([place])   . Anxiety     PSH:  Past Surgical History  Procedure Laterality Date  . Cholecystectomy  1990's  . Abdominal hysterectomy  1982  . Tubal ligation  1978  . Back surgery    . Posterior lumbar fusion  2000's  . Implantation vagal nerve  stimulator  2013  . Vagal nerve stimulator removal  2013    "got an infection in there" (06/01/2013)  . Forearm fracture surgery Left 1999    "put in plates"  . Shoulder open rotator cuff repair Right 2000    Family history: Family History  Problem Relation Age of Onset  . Kidney failure Mother     Social history:  Social History   Social History  . Marital Status: Divorced    Spouse Name: N/A  . Number of Children: N/A  . Years of Education: N/A   Occupational History  . disabled    Social History Main Topics  . Smoking status: Current Every Day Smoker -- 0.50 packs/day for 45 years    Types: Cigarettes  . Smokeless tobacco: Never Used     Comment: h/o 1 PPD  . Alcohol Use: Yes     Comment: 06/01/2013 "glass of wine maybe q 6 months, if that"  . Drug Use: Yes    Special: Marijuana  . Sexual Activity: Not Currently   Other Topics Concern  . Not on file   Social History Narrative    Current outpatient meds: No outpatient prescriptions have been marked as taking for the 11/04/15 encounter [facility] Encounter).    Current inpatient meds:  No current facility-administered medications for this encounter.   Current Outpatient Prescriptions  Medication Sig Dispense Refill  . albuterol (PROVENTIL HFA;VENTOLIN HFA) 108 (90  BASE) MCG/ACT inhaler Inhale 2 puffs into the lungs every 6 (six) hours as needed for wheezing or shortness of breath. 1 Inhaler 2  . Cetirizine HCl (ZYRTEC ALLERGY) 10 MG CAPS Take 1 tablet by mouth daily.    . diphenhydrAMINE (BENADRYL) 25 mg capsule Take 1 capsule (25 mg total) by mouth every 8 (eight) hours as needed for itching or allergies. 30 capsule 0  . fluticasone (FLONASE) 50 MCG/ACT nasal spray Place 2 sprays into both nostrils daily. 3 g 2  . furosemide (LASIX) 20 MG tablet Take 1 tablet by mouth daily.    Marland Kitchen HYDROcodone-acetaminophen (NORCO) 5-325 MG per tablet Take 1 tablet by mouth every 4 (four) hours as needed. 10 tablet 0  . lithium  carbonate (LITHOBID) 300 MG CR tablet Take 1 tablet by mouth daily.     . meclizine (ANTIVERT) 12.5 MG tablet Take 12.5 mg by mouth 3 (three) times daily as needed. For dizziness    . morphine (MSIR) 30 MG tablet Take 30 mg by mouth every 12 (twelve) hours.    . potassium chloride SA (K-DUR,KLOR-CON) 20 MEQ tablet Take 20 mEq by mouth daily.    . pregabalin (LYRICA) 75 MG capsule Take 75 mg by mouth 3 (three) times daily.    . simvastatin (ZOCOR) 10 MG tablet Take 1 tablet by mouth daily.    Marland Kitchen topiramate (TOPAMAX) 25 MG tablet Take 25 mg by mouth daily.    . traZODone (DESYREL) 50 MG tablet Take 1 tablet by mouth daily.    Marland Kitchen VICTOZA 18 MG/3ML SOPN daily.  1    Allergies: Allergies  Allergen Reactions  . Latex Itching  . Naproxen Other (See Comments)    Abdominal pain    ROS: As per HPI. A full 14-point review of systems was performed and is otherwise notable for some shortness of breath and palpitations during today's episode.  PE:  BP 118/81 mmHg  Pulse 82  Resp 18  SpO2 97%  General: WDWN lying on the ED gurney. She is complaining of headache and photophobia and wants to keep her sunglasses on during the examination. AAO x4. Speech clear, no dysarthria. No aphasia. Follows commands briskly. Affect is somewhat irritable. Comportment is normal.  HEENT: Normocephalic. Neck supple without LAD. MMM, OP clear. Dentition good. Sclerae anicteric. No conjunctival injection.  CV: Distant, regular, no murmur. Carotid pulses full and symmetric, no bruits. Distal pulses 2+ and symmetric.  Lungs: CTAB on anterior examination.  Abdomen: Soft, obese, non-distended, non-tender. Bowel sounds present x4.  Extremities: No C/C/E. She has some chronic hyperpigmentation in the skin of her right lower extremity. Neuro:  CN: She is extremely photophobic so is unable to assess her pupils or her fundi. EOMI without nystagmus. No reported diplopia. Facial sensation is intact to light touch. Face is symmetric  at rest with normal strength an mobility. Hearing is intact to conversational voice. Palate elevates symmetrically and uvula is midline. Voice is normal in tone, pitch and quality. Bilateral SCM and trapezii are 5/5. Tongue is midline with normal bulk and mobility.  Motor: Normal bulk, tone, and strength. No tremor or other abnormal movements. No drift.  Sensation: intact to light touch, pinprick, vibration, and joint position.  DTRs: 2+, symmetric. Toes downgoing bilaterally. No pathologic reflexes.  Coordination: Finger-to-nose and heel-to-shin are without dysmetria. Finger taps are normal in amplitude and speed, no decrement.   Labs:  Lab Results  Component Value Date   WBC 8.3 11/04/2015   HGB 16.3* 11/04/2015  HCT 48.0* 11/04/2015   PLT 288 11/04/2015   GLUCOSE 163* 11/04/2015   CHOL 150 06/01/2013   TRIG 90 06/01/2013   HDL 44 06/01/2013   LDLCALC 88 06/01/2013   NA 142 11/04/2015   K 4.0 11/04/2015   CL 104 11/04/2015   CREATININE 0.80 11/04/2015   BUN 11 11/04/2015   CO2 25 04/02/2014   TSH 1.360 06/01/2013   HGBA1C 6.4* 06/01/2013    Imaging:  I have reviewed the CT scan of the head without contrast from today. This is unremarkable. There is no evidence of acute ischemia or other acute intracranial pathology.   Assessment and Plan:  1. Headache: She reports severe headache today with associated nausea, photophobia, phonophobia, and diaphoresis. Presentation is most suggestive of migraine headache without aura, but she has never had any history of migraine or other primary headache disorder in the past. She is not getting the typical description of a thunderclap headache. CT scan of the head in the emergency department was unremarkable. She is normotensive making subarachnoid hemorrhage extremely unlikely. Other considerations for abrupt onset headache would include vasculitis, CNS infection, CSF leak, among others. She has no meningismus or peripheral white blood cell count  making CNS infection unlikely. I would therefore defer lumbar puncture. She has no systemic symptoms that would suggest vasculitis with no prior symptoms to suggest primary CNS vasculitis. At this point, I recommend treating headache with IV Reglan and IV fluids. If symptoms worsen or she develops any new focal deficits, MRI scan of the brain with and without contrast may be helpful. However, given her normal examination and an unremarkable head CT, I think this can be deferred for now.  50 minutes of critical care time was provided in ED, greater than 50% of which was direct patient care.  Melba Coon, M.D. Neurology

## 2015-11-04 NOTE — ED Notes (Signed)
Received pt from home with c/o dizziness, neck pressure, nausea, diaphoresis and headache after running errands today around 1500.

## 2015-11-04 NOTE — Significant Event (Signed)
Rapid Response Event Note  Overview: Time Called: 1706 Arrival Time: 1710 Event Type: Neurologic  Initial Focused Assessment: Patient arrived as Code Stroke in ED.  Interventions: Assisted with NIHSS, CT scan, documentation. Plan of Care (if not transferred): Code stroke cancelled. MD at bedside for further examination. Event Summary:   at      at          Baron Hamper

## 2015-11-04 NOTE — ED Notes (Signed)
CBG 116  

## 2015-11-04 NOTE — ED Notes (Signed)
Melanie Reynolds (pt daughter), [phone], requested to be called with pt disposition

## 2015-11-05 DIAGNOSIS — R51 Headache: Secondary | ICD-10-CM | POA: Diagnosis not present

## 2015-11-05 LAB — I-STAT TROPONIN, ED: Troponin i, poc: 0 ng/mL (ref 0.00–0.08)

## 2015-11-05 NOTE — Discharge Instructions (Signed)
Your imaging of your head showed a possible small abnormality of a blood vessel. Please have a follow-up evaluation with your primary care provider for consideration of a repeat CT angiogram of your head for repeat evaluation of this blood vessel.

## 2015-11-05 NOTE — ED Notes (Signed)
Patient ambulated to the BR without difficulty

## 2015-11-05 NOTE — ED Notes (Signed)
Discharge instructions reviewed - voiced understanding 

## 2016-01-04 ENCOUNTER — Inpatient Hospital Stay (HOSPITAL_COMMUNITY)
Admission: EM | Admit: 2016-01-04 | Discharge: 2016-01-09 | DRG: 871 | Disposition: A | Discharge status: Home / Self Care | Payer: Medicare Other | Attending: Family Medicine | Admitting: Family Medicine

## 2016-01-04 ENCOUNTER — Encounter (HOSPITAL_COMMUNITY): Payer: Self-pay | Admitting: Emergency Medicine

## 2016-01-04 ENCOUNTER — Emergency Department (HOSPITAL_COMMUNITY): Payer: Medicare Other

## 2016-01-04 DIAGNOSIS — Z981 Arthrodesis status: Secondary | ICD-10-CM | POA: Diagnosis not present

## 2016-01-04 DIAGNOSIS — Z22322 Carrier or suspected carrier of Methicillin resistant Staphylococcus aureus: Secondary | ICD-10-CM

## 2016-01-04 DIAGNOSIS — A419 Sepsis, unspecified organism: Principal | ICD-10-CM | POA: Diagnosis present

## 2016-01-04 DIAGNOSIS — D72829 Elevated white blood cell count, unspecified: Secondary | ICD-10-CM | POA: Insufficient documentation

## 2016-01-04 DIAGNOSIS — Z6841 Body Mass Index (BMI) 40.0 and over, adult: Secondary | ICD-10-CM | POA: Diagnosis not present

## 2016-01-04 DIAGNOSIS — Z8614 Personal history of Methicillin resistant Staphylococcus aureus infection: Secondary | ICD-10-CM

## 2016-01-04 DIAGNOSIS — R0600 Dyspnea, unspecified: Secondary | ICD-10-CM | POA: Diagnosis not present

## 2016-01-04 DIAGNOSIS — Z888 Allergy status to other drugs, medicaments and biological substances status: Secondary | ICD-10-CM

## 2016-01-04 DIAGNOSIS — Z9049 Acquired absence of other specified parts of digestive tract: Secondary | ICD-10-CM | POA: Diagnosis not present

## 2016-01-04 DIAGNOSIS — E785 Hyperlipidemia, unspecified: Secondary | ICD-10-CM | POA: Diagnosis present

## 2016-01-04 DIAGNOSIS — M545 Low back pain: Secondary | ICD-10-CM | POA: Diagnosis present

## 2016-01-04 DIAGNOSIS — Z23 Encounter for immunization: Secondary | ICD-10-CM | POA: Diagnosis not present

## 2016-01-04 DIAGNOSIS — R6520 Severe sepsis without septic shock: Secondary | ICD-10-CM | POA: Diagnosis present

## 2016-01-04 DIAGNOSIS — N393 Stress incontinence (female) (male): Secondary | ICD-10-CM | POA: Diagnosis present

## 2016-01-04 DIAGNOSIS — I248 Other forms of acute ischemic heart disease: Secondary | ICD-10-CM | POA: Diagnosis present

## 2016-01-04 DIAGNOSIS — F419 Anxiety disorder, unspecified: Secondary | ICD-10-CM | POA: Diagnosis present

## 2016-01-04 DIAGNOSIS — Z79899 Other long term (current) drug therapy: Secondary | ICD-10-CM

## 2016-01-04 DIAGNOSIS — G4733 Obstructive sleep apnea (adult) (pediatric): Secondary | ICD-10-CM | POA: Diagnosis present

## 2016-01-04 DIAGNOSIS — N179 Acute kidney failure, unspecified: Secondary | ICD-10-CM | POA: Diagnosis present

## 2016-01-04 DIAGNOSIS — G8929 Other chronic pain: Secondary | ICD-10-CM | POA: Diagnosis present

## 2016-01-04 DIAGNOSIS — R000 Tachycardia, unspecified: Secondary | ICD-10-CM | POA: Insufficient documentation

## 2016-01-04 DIAGNOSIS — F319 Bipolar disorder, unspecified: Secondary | ICD-10-CM | POA: Diagnosis present

## 2016-01-04 DIAGNOSIS — J189 Pneumonia, unspecified organism: Secondary | ICD-10-CM | POA: Diagnosis not present

## 2016-01-04 DIAGNOSIS — F17210 Nicotine dependence, cigarettes, uncomplicated: Secondary | ICD-10-CM | POA: Diagnosis present

## 2016-01-04 DIAGNOSIS — Z720 Tobacco use: Secondary | ICD-10-CM | POA: Diagnosis present

## 2016-01-04 DIAGNOSIS — I959 Hypotension, unspecified: Secondary | ICD-10-CM | POA: Diagnosis present

## 2016-01-04 DIAGNOSIS — Z841 Family history of disorders of kidney and ureter: Secondary | ICD-10-CM

## 2016-01-04 DIAGNOSIS — R51 Headache: Secondary | ICD-10-CM | POA: Diagnosis present

## 2016-01-04 DIAGNOSIS — E876 Hypokalemia: Secondary | ICD-10-CM | POA: Diagnosis present

## 2016-01-04 DIAGNOSIS — A481 Legionnaires' disease: Secondary | ICD-10-CM | POA: Diagnosis present

## 2016-01-04 DIAGNOSIS — R519 Headache, unspecified: Secondary | ICD-10-CM

## 2016-01-04 DIAGNOSIS — E669 Obesity, unspecified: Secondary | ICD-10-CM | POA: Diagnosis present

## 2016-01-04 DIAGNOSIS — Z8701 Personal history of pneumonia (recurrent): Secondary | ICD-10-CM

## 2016-01-04 DIAGNOSIS — E119 Type 2 diabetes mellitus without complications: Secondary | ICD-10-CM

## 2016-01-04 DIAGNOSIS — Z91040 Latex allergy status: Secondary | ICD-10-CM

## 2016-01-04 DIAGNOSIS — K5900 Constipation, unspecified: Secondary | ICD-10-CM | POA: Diagnosis present

## 2016-01-04 LAB — COMPREHENSIVE METABOLIC PANEL
ALT: 35 U/L (ref 14–54)
ANION GAP: 11 (ref 5–15)
AST: 33 U/L (ref 15–41)
Albumin: 3.4 g/dL — ABNORMAL LOW (ref 3.5–5.0)
Alkaline Phosphatase: 81 U/L (ref 38–126)
BILIRUBIN TOTAL: 1.8 mg/dL — AB (ref 0.3–1.2)
BUN: 11 mg/dL (ref 6–20)
CHLORIDE: 101 mmol/L (ref 101–111)
CO2: 19 mmol/L — ABNORMAL LOW (ref 22–32)
Calcium: 9.5 mg/dL (ref 8.9–10.3)
Creatinine, Ser: 1.22 mg/dL — ABNORMAL HIGH (ref 0.44–1.00)
GFR, EST AFRICAN AMERICAN: 53 mL/min — AB (ref >60)
GFR, EST NON AFRICAN AMERICAN: 46 mL/min — AB (ref >60)
Glucose, Bld: 168 mg/dL — ABNORMAL HIGH (ref 65–99)
POTASSIUM: 4.3 mmol/L (ref 3.5–5.1)
Sodium: 131 mmol/L — ABNORMAL LOW (ref 135–145)
TOTAL PROTEIN: 7.9 g/dL (ref 6.5–8.1)

## 2016-01-04 LAB — CBG MONITORING, ED: GLUCOSE-CAPILLARY: 193 mg/dL — AB (ref 65–99)

## 2016-01-04 LAB — CBC WITH DIFFERENTIAL/PLATELET
BASOS ABS: 0 10*3/uL (ref 0.0–0.1)
BASOS PCT: 0 %
EOS ABS: 0 10*3/uL (ref 0.0–0.7)
Eosinophils Relative: 0 %
HEMATOCRIT: 43.1 % (ref 36.0–46.0)
HEMOGLOBIN: 14.1 g/dL (ref 12.0–15.0)
Lymphocytes Relative: 7 %
Lymphs Abs: 1.7 10*3/uL (ref 0.7–4.0)
MCH: 29.6 pg (ref 26.0–34.0)
MCHC: 32.7 g/dL (ref 30.0–36.0)
MCV: 90.4 fL (ref 78.0–100.0)
MONO ABS: 1.9 10*3/uL — AB (ref 0.1–1.0)
MONOS PCT: 8 %
NEUTROS ABS: 21.1 10*3/uL — AB (ref 1.7–7.7)
NEUTROS PCT: 85 %
Platelets: 279 10*3/uL (ref 150–400)
RBC: 4.77 MIL/uL (ref 3.87–5.11)
RDW: 14 % (ref 11.5–15.5)
WBC: 24.7 10*3/uL — ABNORMAL HIGH (ref 4.0–10.5)

## 2016-01-04 LAB — URINE MICROSCOPIC-ADD ON

## 2016-01-04 LAB — URINALYSIS, ROUTINE W REFLEX MICROSCOPIC
Glucose, UA: NEGATIVE mg/dL
Ketones, ur: 15 mg/dL — AB
NITRITE: NEGATIVE
PROTEIN: 100 mg/dL — AB
SPECIFIC GRAVITY, URINE: 1.024 (ref 1.005–1.030)
pH: 5.5 (ref 5.0–8.0)

## 2016-01-04 LAB — I-STAT CG4 LACTIC ACID, ED
LACTIC ACID, VENOUS: 3.05 mmol/L — AB (ref 0.5–1.9)
LACTIC ACID, VENOUS: 1.76 mmol/L (ref 0.5–1.9)

## 2016-01-04 MED ORDER — TOPIRAMATE 25 MG PO TABS
25.0000 mg | ORAL_TABLET | Freq: Every day | ORAL | Status: DC
  Administered 2016-01-05 – 2016-01-09 (×5): 25 mg via ORAL
  Filled 2016-01-04 (×6): qty 1

## 2016-01-04 MED ORDER — ACETAMINOPHEN 325 MG PO TABS
ORAL_TABLET | ORAL | Status: AC
  Filled 2016-01-04: qty 2

## 2016-01-04 MED ORDER — ENOXAPARIN SODIUM 40 MG/0.4ML ~~LOC~~ SOLN
40.0000 mg | SUBCUTANEOUS | Status: DC
  Administered 2016-01-05 – 2016-01-09 (×5): 40 mg via SUBCUTANEOUS
  Filled 2016-01-04 (×5): qty 0.4

## 2016-01-04 MED ORDER — IPRATROPIUM-ALBUTEROL 0.5-2.5 (3) MG/3ML IN SOLN
3.0000 mL | RESPIRATORY_TRACT | Status: DC | PRN
  Administered 2016-01-05 – 2016-01-06 (×2): 3 mL via RESPIRATORY_TRACT
  Filled 2016-01-04 (×2): qty 3

## 2016-01-04 MED ORDER — INSULIN ASPART 100 UNIT/ML ~~LOC~~ SOLN
0.0000 [IU] | Freq: Three times a day (TID) | SUBCUTANEOUS | Status: DC
  Administered 2016-01-05: 4 [IU] via SUBCUTANEOUS
  Administered 2016-01-05 – 2016-01-06 (×4): 3 [IU] via SUBCUTANEOUS
  Administered 2016-01-07: 4 [IU] via SUBCUTANEOUS
  Administered 2016-01-07: 3 [IU] via SUBCUTANEOUS
  Administered 2016-01-07: 4 [IU] via SUBCUTANEOUS
  Administered 2016-01-08 – 2016-01-09 (×5): 3 [IU] via SUBCUTANEOUS

## 2016-01-04 MED ORDER — SODIUM CHLORIDE 0.9 % IV BOLUS (SEPSIS)
1000.0000 mL | Freq: Once | INTRAVENOUS | Status: AC
  Administered 2016-01-04: 1000 mL via INTRAVENOUS

## 2016-01-04 MED ORDER — ACETAMINOPHEN 325 MG PO TABS
650.0000 mg | ORAL_TABLET | Freq: Once | ORAL | Status: AC | PRN
  Administered 2016-01-04: 650 mg via ORAL

## 2016-01-04 MED ORDER — PREGABALIN 75 MG PO CAPS
75.0000 mg | ORAL_CAPSULE | Freq: Three times a day (TID) | ORAL | Status: DC
  Administered 2016-01-04 – 2016-01-09 (×15): 75 mg via ORAL
  Filled 2016-01-04 (×6): qty 1
  Filled 2016-01-04: qty 3
  Filled 2016-01-04 (×2): qty 1
  Filled 2016-01-04: qty 3
  Filled 2016-01-04 (×5): qty 1

## 2016-01-04 MED ORDER — FLUTICASONE PROPIONATE 50 MCG/ACT NA SUSP
2.0000 | Freq: Every day | NASAL | Status: DC
Start: 2016-01-05 — End: 2016-01-10
  Administered 2016-01-05 – 2016-01-09 (×5): 2 via NASAL
  Filled 2016-01-04 (×2): qty 16

## 2016-01-04 MED ORDER — GUAIFENESIN ER 600 MG PO TB12
600.0000 mg | ORAL_TABLET | Freq: Two times a day (BID) | ORAL | Status: DC
  Administered 2016-01-04 – 2016-01-09 (×10): 600 mg via ORAL
  Filled 2016-01-04 (×10): qty 1

## 2016-01-04 MED ORDER — IBUPROFEN 800 MG PO TABS
800.0000 mg | ORAL_TABLET | Freq: Once | ORAL | Status: AC
  Administered 2016-01-04: 800 mg via ORAL
  Filled 2016-01-04: qty 1

## 2016-01-04 MED ORDER — DEXTROSE 5 % IV SOLN
500.0000 mg | Freq: Once | INTRAVENOUS | Status: AC
  Administered 2016-01-04: 500 mg via INTRAVENOUS
  Filled 2016-01-04: qty 500

## 2016-01-04 MED ORDER — DEXTROSE 5 % IV SOLN
1.0000 g | INTRAVENOUS | Status: DC
  Filled 2016-01-04: qty 10

## 2016-01-04 MED ORDER — SIMVASTATIN 20 MG PO TABS
10.0000 mg | ORAL_TABLET | Freq: Every day | ORAL | Status: DC
  Administered 2016-01-05 – 2016-01-09 (×5): 10 mg via ORAL
  Filled 2016-01-04 (×5): qty 1

## 2016-01-04 MED ORDER — TRAZODONE HCL 50 MG PO TABS
50.0000 mg | ORAL_TABLET | Freq: Every day | ORAL | Status: DC
  Administered 2016-01-04 – 2016-01-08 (×5): 50 mg via ORAL
  Filled 2016-01-04 (×5): qty 1

## 2016-01-04 MED ORDER — AZITHROMYCIN 500 MG IV SOLR
500.0000 mg | INTRAVENOUS | Status: DC
  Filled 2016-01-04: qty 500

## 2016-01-04 MED ORDER — IPRATROPIUM-ALBUTEROL 0.5-2.5 (3) MG/3ML IN SOLN
3.0000 mL | Freq: Once | RESPIRATORY_TRACT | Status: AC
  Administered 2016-01-04: 3 mL via RESPIRATORY_TRACT
  Filled 2016-01-04: qty 3

## 2016-01-04 MED ORDER — NICOTINE 14 MG/24HR TD PT24
14.0000 mg | MEDICATED_PATCH | Freq: Every day | TRANSDERMAL | Status: DC
  Administered 2016-01-05 – 2016-01-09 (×5): 14 mg via TRANSDERMAL
  Filled 2016-01-04 (×5): qty 1

## 2016-01-04 MED ORDER — DEXTROSE 5 % IV SOLN
1.0000 g | Freq: Once | INTRAVENOUS | Status: AC
  Administered 2016-01-04: 1 g via INTRAVENOUS
  Filled 2016-01-04: qty 10

## 2016-01-04 MED ORDER — HYDROCODONE-ACETAMINOPHEN 5-325 MG PO TABS
1.0000 | ORAL_TABLET | ORAL | Status: DC | PRN
  Administered 2016-01-05 – 2016-01-06 (×4): 1 via ORAL
  Filled 2016-01-04 (×4): qty 1

## 2016-01-04 MED ORDER — INSULIN ASPART 100 UNIT/ML ~~LOC~~ SOLN: 0.0000 [IU] | Freq: Every day | SUBCUTANEOUS | Status: DC

## 2016-01-04 MED ORDER — SODIUM CHLORIDE 0.9 % IV BOLUS (SEPSIS)
500.0000 mL | Freq: Once | INTRAVENOUS | Status: AC
  Administered 2016-01-04: 500 mL via INTRAVENOUS

## 2016-01-04 MED ORDER — DIPHENHYDRAMINE HCL 25 MG PO CAPS: 25.0000 mg | ORAL_CAPSULE | Freq: Three times a day (TID) | ORAL | Status: DC | PRN

## 2016-01-04 NOTE — H&P (Signed)
History and Physical    Melanie Reynolds [id number] DOB: 07/26/1950 DOA: 01/04/2016  PCP: Philis Fendt, MD   Patient coming from: Home  Chief Complaint: Fever, chills, productive cough   HPI: Melanie Reynolds is a 65 y.o. woman with a history of Type 2 DM, Bipolar disorder, prior pneumonia, and OSA requiring CPAP who presents to the ED accompanied by her granddaughter.  The patient reports two days of cough productive of thick, green sputum (no hemoptysis).  She has have fevers, chills, and sweats.  She has had shortness of breath and light-headedness, but no loss of consciousness.  She reports feeling congested in her chest but does not specifically complain of chest pressure or tightness.  No nausea or vomiting.  She does not wear home oxygen at baseline.  Her granddaughter has had URI symptoms, but she never had a fever.  ED Course: T max 103.  First lactic acid level 3.  WBC count 24.7.  Tachycardia with HR 110's-120's.  Sepsis protocol activated.  Weight based volume resuscitation ordered per protocol. Chest xray shows right perihilar/RML infiltrate.  IV Rocephin and azithromycin.  Blood and urine cultures pending.  Hospitalist asked to admit.    Review of Systems: As per HPI otherwise 10 point review of systems negative.    Past Medical History:  Diagnosis Date  . Anxiety   . Arthritis    "knees, legs, back" (06/01/2013)  . Bipolar depression ([place])   . Chronic lower back pain   . High cholesterol   . OSA (obstructive sleep apnea)   . Pneumonia 1970's; 1980's; 06/01/2013   "total of 3 times"  . Type II diabetes mellitus ([place])     Past Surgical History:  Procedure Laterality Date  . ABDOMINAL HYSTERECTOMY  1982  . BACK SURGERY    . CHOLECYSTECTOMY  1990's  . FOREARM FRACTURE SURGERY Left 1999   "put in plates"  . IMPLANTATION VAGAL NERVE STIMULATOR  2013  . POSTERIOR LUMBAR FUSION  2000's  . SHOULDER OPEN ROTATOR CUFF REPAIR Right 2000  . TUBAL LIGATION  1978  . VAGAL NERVE  STIMULATOR REMOVAL  2013   "got an infection in there" (06/01/2013)     reports that she has been smoking Cigarettes.  She has a 22.50 pack-year smoking history. She has never used smokeless tobacco. She reports that she drinks alcohol. She reports that she uses drugs, including Marijuana.  Active tobacco use; 1/2ppd.  Denies significant EtOH or illicit drug use to me.  She has three adult children.  She is retired.  Allergies  Allergen Reactions  . Latex Itching  . Naproxen Other (See Comments)    Abdominal pain    Family History  Problem Relation Age of Onset  . Kidney failure Mother      Prior to Admission medications   Medication Sig Start Date End Date Taking? Authorizing Provider  albuterol (PROVENTIL HFA;VENTOLIN HFA) 108 (90 BASE) MCG/ACT inhaler Inhale 2 puffs into the lungs every 6 (six) hours as needed for wheezing or shortness of breath. 06/03/13  Yes Belkys A Regalado, MD  diphenhydrAMINE (BENADRYL) 25 mg capsule Take 1 capsule (25 mg total) by mouth every 8 (eight) hours as needed for itching or allergies. 06/03/13  Yes Belkys A Regalado, MD  fluticasone (FLONASE) 50 MCG/ACT nasal spray Place 2 sprays into both nostrils daily. 06/03/13  Yes Belkys A Regalado, MD  furosemide (LASIX) 20 MG tablet Take 1 tablet by mouth daily.   Yes Historical Provider, MD  HYDROcodone-acetaminophen (  NORCO) 5-325 MG per tablet Take 1 tablet by mouth every 4 (four) hours as needed. Patient taking differently: Take 1 tablet by mouth every 4 (four) hours as needed for moderate pain.  04/02/14  Yes Daleen Bo, MD  potassium chloride SA (K-DUR,KLOR-CON) 20 MEQ tablet Take 20 mEq by mouth daily.   Yes Historical Provider, MD  pregabalin (LYRICA) 75 MG capsule Take 75 mg by mouth 3 (three) times daily.   Yes Historical Provider, MD  simvastatin (ZOCOR) 10 MG tablet Take 1 tablet by mouth daily. 05/29/13  Yes Historical Provider, MD  topiramate (TOPAMAX) 25 MG tablet Take 25 mg by mouth daily.   Yes  Historical Provider, MD  traZODone (DESYREL) 50 MG tablet Take 1 tablet by mouth at bedtime.  05/23/13  Yes Historical Provider, MD  VICTOZA 18 MG/3ML SOPN Inject 18 mg into the skin daily.  07/29/14  Yes Historical Provider, MD    Physical Exam: Vitals:   01/04/16 2145 01/04/16 2200 01/04/16 2213 01/04/16 2215  BP: 113/71 103/58  115/63  Pulse: 110 109  112  Resp: (!) 35 26  (!) 31  Temp:   102 F (38.9 C)   TempSrc:   Oral   SpO2: 96% 100%  96%  Weight:      Height:          Constitutional: NAD, calm, but ill appearing Vitals:   01/04/16 2145 01/04/16 2200 01/04/16 2213 01/04/16 2215  BP: 113/71 103/58  115/63  Pulse: 110 109  112  Resp: (!) 35 26  (!) 31  Temp:   102 F (38.9 C)   TempSrc:   Oral   SpO2: 96% 100%  96%  Weight:      Height:       Eyes: PERRL, lids and conjunctivae normal ENMT: Mucous membranes are dry. Posterior pharynx clear of any exudate or lesions. Normal dentition.  Neck: normal appearance, supple Respiratory: Ronchi at right lung base.  Bilateral wheezing.  Normal respiratory effort. No accessory muscle use.  Cardiovascular: Tachycardic but regular.  No murmurs / rubs / gallops. No extremity edema. 2+ pedal pulses.   GI: abdomen is soft and compressible.  No distention.  No tenderness.  Bowel sounds are present. Musculoskeletal:  No joint deformity in upper and lower extremities. Good ROM, no contractures. Normal muscle tone.  Skin: no rashes, warm and dry Neurologic: No focal deficits. Psychiatric: Normal judgment and insight. Alert and oriented x 3. Normal mood.     Labs on Admission: I have personally reviewed following labs and imaging studies  CBC:  Recent Labs Lab 01/04/16 1941  WBC 24.7*  NEUTROABS 21.1*  HGB 14.1  HCT 43.1  MCV 90.4  PLT 123XX123   Basic Metabolic Panel:  Recent Labs Lab 01/04/16 1926  NA 131*  K 4.3  CL 101  CO2 19*  GLUCOSE 168*  BUN 11  CREATININE 1.22*  CALCIUM 9.5   GFR: Estimated Creatinine  Clearance: 53.9 mL/min (by C-G formula based on SCr of 1.22 mg/dL). Liver Function Tests:  Recent Labs Lab 01/04/16 1926  AST 33  ALT 35  ALKPHOS 81  BILITOT 1.8*  PROT 7.9  ALBUMIN 3.4*   Urine analysis:    Component Value Date/Time   COLORURINE AMBER (A) 01/04/2016 1926   APPEARANCEUR HAZY (A) 01/04/2016 1926   LABSPEC 1.024 01/04/2016 1926   PHURINE 5.5 01/04/2016 1926   GLUCOSEU NEGATIVE 01/04/2016 1926   HGBUR SMALL (A) 01/04/2016 1926   BILIRUBINUR SMALL (A) 01/04/2016  1926   KETONESUR 15 (A) 01/04/2016 1926   PROTEINUR 100 (A) 01/04/2016 1926   UROBILINOGEN 1.0 04/02/2014 1230   NITRITE NEGATIVE 01/04/2016 1926   LEUKOCYTESUR TRACE (A) 01/04/2016 1926   Sepsis Labs:  Lactic acid level 3.05, repeat 1.70  Radiological Exams on Admission: Dg Chest 2 View  Result Date: 01/04/2016 CLINICAL DATA:  65 year old female with cough EXAM: CHEST  2 VIEW COMPARISON:  Chest radiograph dated 08/29/2015 FINDINGS: Two views of the chest demonstrate a focal area of opacity in the right hilar/right middle lobe, new from prior study and most likely pneumonia. Underlying mass is not excluded. Follow-up to resolution is recommended. There is no pleural effusion or pneumothorax. The cardiac silhouette is within normal limits. No acute osseous pathology. IMPRESSION: Focal right perihilar/right middle lobe opacity. Findings most likely represent pneumonia. Correlation with clinical exam and follow-up to resolution is recommended to exclude underlying mass. Electronically Signed   By: Anner Crete M.D.   On: 01/04/2016 20:57    EKG: Independently reviewed. Sinus tachycardia. No acute ST segment changes.  Assessment/Plan Principal Problem:   Sepsis ([place]) Active Problems:   CAP (community acquired pneumonia)   Diabetes ([place])   Tobacco user   Leukocytosis   Sinus tachycardia ([place])   AKI (acute kidney injury) ([employer])      Severe sepsis secondary to CAP --Admit to stepdown unit --IV  Rocephin and azithromycin --Sputum, blood, urine cultures --Respiratory therapy --Mucinex 600mg  BID --Duonebs prn --Will check cortisol, procalcitonin levels --Urine legionella and streptococcal antigens --Check HIV --Serial troponin --Hold lasix. --3.5L NS bolus given in the ED.  MAP still greater than 65 on the floor but systolic BP dropping intermittently to upper 80's while sleeping.  O2 sat 95% on RA.  Additional 500cc bolus one time now and start maintenance rate of 100cc/hr.  Monitor respiratory status.  Low threshold for PCCM consult.  Verbal sign out given to [facility] floor coverage.     DM --Hold Victoza for now --SSI coverage  Active tobacco use --Nicotine patch  Mild AKI --Expect correction with IV fluids --Urine appears more consistent with dehydration than infection   DVT prophylaxis: Lovenox Code Status: FULL Family Communication: Granddaughter present at bedside at time of admission Disposition Plan: To be determined. Consults called: NONE Admission status: Inpatient, stepdown unit   TIME SPENT: 70 minutes   Eber Jones MD [facility] Pager [phone]  If 7PM-7AM, please contact night-coverage [email or web address] Password [facility]  01/04/2016, 10:42 PM

## 2016-01-04 NOTE — ED Provider Notes (Signed)
[facility] DEPT Provider Note   CSN: [id number] Arrival date & time: 01/04/16  [id number]   History   Chief Complaint Chief Complaint  Patient presents with  . Pneumonia  . Fever    HPI Jailani Woodliff is a 65 y.o. female.  The history is provided by the patient and medical records.   65 year old female with history of diabetes, hyperlipidemia, bipolar disorder, OSA, tobacco use presenting with fever and SOB. Onset a couple days ago. Worsening since then. Now severe. Described as feeling short of breath, with productive cough of sputum, with fevers and chills, nausea, decreased appetite. Worse with exertion. Not alleviated by rest. Similar to prior presentations that patient was admitted for pneumonia. Some chest discomfort with coughing. No abdominal pain, vomiting, diarrhea, dysuria, flank pain. Some associated diffuse headaches. No neck stiffness or pain.    Past Medical History:  Diagnosis Date  . Anxiety   . Arthritis    "knees, legs, back" (06/01/2013)  . Bipolar depression ([place])   . Chronic lower back pain   . High cholesterol   . OSA (obstructive sleep apnea)   . Pneumonia 1970's; 1980's; 06/01/2013   "total of 3 times"  . Type II diabetes mellitus [facility])     Patient Active Problem List   Diagnosis Date Noted  . Sepsis ([place]) 01/04/2016  . Leukocytosis 01/04/2016  . Sinus tachycardia ([place]) 01/04/2016  . AKI (acute kidney injury) ([place]) 01/04/2016  . Exertional dyspnea 08/29/2015  . Tobacco user 08/29/2015  . Obesity 08/29/2015  . OSA (obstructive sleep apnea) 08/13/2013  . Pneumonia 06/01/2013  . CAP (community acquired pneumonia) 06/01/2013  . Chest pain, musculoskeletal 06/01/2013  . Bipolar 1 disorder ([place]) 06/01/2013  . Hypotension 06/01/2013  . Chronic pain 06/01/2013  . Diabetes ([place]) 06/01/2013    Past Surgical History:  Procedure Laterality Date  . ABDOMINAL HYSTERECTOMY  1982  . BACK SURGERY    . CHOLECYSTECTOMY  1990's  . FOREARM FRACTURE SURGERY Left  1999   "put in plates"  . IMPLANTATION VAGAL NERVE STIMULATOR  2013  . POSTERIOR LUMBAR FUSION  2000's  . SHOULDER OPEN ROTATOR CUFF REPAIR Right 2000  . TUBAL LIGATION  1978  . VAGAL NERVE STIMULATOR REMOVAL  2013   "got an infection in there" (06/01/2013)    OB History    No data available       Home Medications    Prior to Admission medications   Medication Sig Start Date End Date Taking? Authorizing Provider  albuterol (PROVENTIL HFA;VENTOLIN HFA) 108 (90 BASE) MCG/ACT inhaler Inhale 2 puffs into the lungs every 6 (six) hours as needed for wheezing or shortness of breath. 06/03/13  Yes Belkys A Regalado, MD  diphenhydrAMINE (BENADRYL) 25 mg capsule Take 1 capsule (25 mg total) by mouth every 8 (eight) hours as needed for itching or allergies. 06/03/13  Yes Belkys A Regalado, MD  fluticasone (FLONASE) 50 MCG/ACT nasal spray Place 2 sprays into both nostrils daily. 06/03/13  Yes Belkys A Regalado, MD  furosemide (LASIX) 20 MG tablet Take 1 tablet by mouth daily.   Yes Historical Provider, MD  HYDROcodone-acetaminophen (NORCO) 5-325 MG per tablet Take 1 tablet by mouth every 4 (four) hours as needed. Patient taking differently: Take 1 tablet by mouth every 4 (four) hours as needed for moderate pain.  04/02/14  Yes Daleen Bo, MD  potassium chloride SA (K-DUR,KLOR-CON) 20 MEQ tablet Take 20 mEq by mouth daily.   Yes Historical Provider, MD  pregabalin (LYRICA) 75 MG capsule  Take 75 mg by mouth 3 (three) times daily.   Yes Historical Provider, MD  simvastatin (ZOCOR) 10 MG tablet Take 1 tablet by mouth daily. 05/29/13  Yes Historical Provider, MD  topiramate (TOPAMAX) 25 MG tablet Take 25 mg by mouth daily.   Yes Historical Provider, MD  traZODone (DESYREL) 50 MG tablet Take 1 tablet by mouth at bedtime.  05/23/13  Yes Historical Provider, MD  VICTOZA 18 MG/3ML SOPN Inject 18 mg into the skin daily.  07/29/14  Yes Historical Provider, MD    Family History Family History  Problem Relation Age  of Onset  . Kidney failure Mother     Social History Social History  Substance Use Topics  . Smoking status: Current Every Day Smoker    Packs/day: 0.50    Years: 45.00    Types: Cigarettes  . Smokeless tobacco: Never Used     Comment: h/o 1 PPD  . Alcohol use Yes     Comment: 06/01/2013 "glass of wine maybe q 6 months, if that"     Allergies   Latex and Naproxen   Review of Systems Review of Systems  Constitutional: Positive for chills, fatigue and fever.  HENT: Negative for ear pain, rhinorrhea and sore throat.   Respiratory: Positive for cough and shortness of breath.   Cardiovascular: Negative for palpitations.  Gastrointestinal: Positive for nausea. Negative for abdominal pain, diarrhea and vomiting.  Genitourinary: Negative for dysuria and frequency.  Musculoskeletal: Negative for neck pain and neck stiffness.  Skin: Negative for rash.  Allergic/Immunologic: Negative for immunocompromised state.  Neurological: Positive for headaches. Negative for weakness and numbness.  Psychiatric/Behavioral: Negative for confusion.  All other systems reviewed and are negative.    Physical Exam Updated Vital Signs BP 92/55   Pulse 95   Temp 98.6 F (37 C) (Oral)   Resp (!) 25   Ht 5\' 3"  (1.6 m)   Wt 106.2 kg   SpO2 93%   BMI 41.47 kg/m   Physical Exam  Constitutional: She is oriented to person, place, and time. She appears well-developed and well-nourished. No distress.  Ill appearing   HENT:  Head: Normocephalic and atraumatic.  Eyes: Conjunctivae are normal.  Neck: Neck supple.  No meningismus   Cardiovascular: Regular rhythm.  Tachycardia present.   No murmur heard. Pulmonary/Chest: Effort normal. No respiratory distress. She has rales (right sided). She exhibits no tenderness.  Abdominal: Soft. There is no tenderness.  Musculoskeletal: She exhibits no edema.  Neurological: She is alert and oriented to person, place, and time. She has normal strength. No  cranial nerve deficit or sensory deficit. She exhibits normal muscle tone. Coordination normal.  Skin: Skin is warm and dry.  Psychiatric: She has a normal mood and affect.  Nursing note and vitals reviewed.   ED Treatments / Results  Labs (all labs ordered are listed, but only abnormal results are displayed) Labs Reviewed  COMPREHENSIVE METABOLIC PANEL - Abnormal; Notable for the following:       Result Value   Sodium 131 (*)    CO2 19 (*)    Glucose, Bld 168 (*)    Creatinine, Ser 1.22 (*)    Albumin 3.4 (*)    Total Bilirubin 1.8 (*)    GFR calc non Af Amer 46 (*)    GFR calc Af Amer 53 (*)    All other components within normal limits  URINALYSIS, ROUTINE W REFLEX MICROSCOPIC (NOT AT [facility]) - Abnormal; Notable for the following:  Color, Urine AMBER (*)    APPearance HAZY (*)    Hgb urine dipstick SMALL (*)    Bilirubin Urine SMALL (*)    Ketones, ur 15 (*)    Protein, ur 100 (*)    Leukocytes, UA TRACE (*)    All other components within normal limits  CBC WITH DIFFERENTIAL/PLATELET - Abnormal; Notable for the following:    WBC 24.7 (*)    Neutro Abs 21.1 (*)    Monocytes Absolute 1.9 (*)    All other components within normal limits  URINE MICROSCOPIC-ADD ON - Abnormal; Notable for the following:    Squamous Epithelial / LPF 6-30 (*)    Bacteria, UA FEW (*)    All other components within normal limits  PROTIME-INR - Abnormal; Notable for the following:    Prothrombin Time 16.4 (*)    All other components within normal limits  I-STAT CG4 LACTIC ACID, ED - Abnormal; Notable for the following:    Lactic Acid, Venous 3.05 (*)    All other components within normal limits  CBG MONITORING, ED - Abnormal; Notable for the following:    Glucose-Capillary 193 (*)    All other components within normal limits  CULTURE, BLOOD (ROUTINE X 2)  CULTURE, BLOOD (ROUTINE X 2)  URINE CULTURE  CULTURE, EXPECTORATED SPUTUM-ASSESSMENT  GRAM STAIN  MRSA PCR SCREENING  HIV ANTIBODY  (ROUTINE TESTING)  STREP PNEUMONIAE URINARY ANTIGEN  CORTISOL  TROPONIN I  PROCALCITONIN  LEGIONELLA PNEUMOPHILA SEROGP 1 UR AG  CBC WITH DIFFERENTIAL/PLATELET  COMPREHENSIVE METABOLIC PANEL  HEMOGLOBIN A1C  I-STAT CG4 LACTIC ACID, ED    EKG  EKG Interpretation  Date/Time:  Thursday January 04 2016 20:58:56 EDT Ventricular Rate:  119 PR Interval:    QRS Duration: 81 QT Interval:  311 QTC Calculation: 438 R Axis:   88 Text Interpretation:  Sinus tachycardia Right atrial enlargement Borderline right axis deviation Borderline T wave abnormalities Baseline wander in lead(s) II III aVR aVF V4 Confirmed by Christy Gentles  MD, DONALD ([id number]) on 01/04/2016 9:52:02 PM       Radiology Dg Chest 2 View  Result Date: 01/04/2016 CLINICAL DATA:  65 year old female with cough EXAM: CHEST  2 VIEW COMPARISON:  Chest radiograph dated 08/29/2015 FINDINGS: Two views of the chest demonstrate a focal area of opacity in the right hilar/right middle lobe, new from prior study and most likely pneumonia. Underlying mass is not excluded. Follow-up to resolution is recommended. There is no pleural effusion or pneumothorax. The cardiac silhouette is within normal limits. No acute osseous pathology. IMPRESSION: Focal right perihilar/right middle lobe opacity. Findings most likely represent pneumonia. Correlation with clinical exam and follow-up to resolution is recommended to exclude underlying mass. Electronically Signed   By: Anner Crete M.D.   On: 01/04/2016 20:57    Procedures Procedures (including critical care time)  Medications Ordered in ED Medications  pregabalin (LYRICA) capsule 75 mg (75 mg Oral Given 01/04/16 2351)  HYDROcodone-acetaminophen (NORCO/VICODIN) 5-325 MG per tablet 1 tablet (not administered)  topiramate (TOPAMAX) tablet 25 mg (not administered)  diphenhydrAMINE (BENADRYL) capsule 25 mg (not administered)  fluticasone (FLONASE) 50 MCG/ACT nasal spray 2 spray (not administered)    simvastatin (ZOCOR) tablet 10 mg (not administered)  traZODone (DESYREL) tablet 50 mg (50 mg Oral Given 01/04/16 2351)  nicotine (NICODERM CQ - dosed in mg/24 hours) patch 14 mg (not administered)  enoxaparin (LOVENOX) injection 40 mg (not administered)  cefTRIAXone (ROCEPHIN) 1 g in dextrose 5 % 50 mL IVPB (not  administered)  azithromycin (ZITHROMAX) 500 mg in dextrose 5 % 250 mL IVPB (not administered)  insulin aspart (novoLOG) injection 0-20 Units (not administered)  insulin aspart (novoLOG) injection 0-5 Units (0 Units Subcutaneous Not Given 01/04/16 2342)  ipratropium-albuterol (DUONEB) 0.5-2.5 (3) MG/3ML nebulizer solution 3 mL (not administered)  guaiFENesin (MUCINEX) 12 hr tablet 600 mg (600 mg Oral Given 01/04/16 2351)  acetaminophen (TYLENOL) tablet 650 mg (650 mg Oral Given 01/04/16 1927)  sodium chloride 0.9 % bolus 1,000 mL (0 mLs Intravenous Stopped 01/04/16 2153)    And  sodium chloride 0.9 % bolus 1,000 mL (0 mLs Intravenous Stopped 01/04/16 2225)    And  sodium chloride 0.9 % bolus 1,000 mL (0 mLs Intravenous Stopped 01/04/16 2233)    And  sodium chloride 0.9 % bolus 500 mL (0 mLs Intravenous Stopped 01/04/16 2309)  cefTRIAXone (ROCEPHIN) 1 g in dextrose 5 % 50 mL IVPB (0 g Intravenous Stopped 01/04/16 2153)  azithromycin (ZITHROMAX) 500 mg in dextrose 5 % 250 mL IVPB (0 mg Intravenous Stopped 01/04/16 2301)  ibuprofen (ADVIL,MOTRIN) tablet 800 mg (800 mg Oral Given 01/04/16 2111)  ipratropium-albuterol (DUONEB) 0.5-2.5 (3) MG/3ML nebulizer solution 3 mL (3 mLs Nebulization Given 01/04/16 2157)     Initial Impression / Assessment and Plan / ED Course  I have reviewed the triage vital signs and the nursing notes.  Pertinent labs & imaging results that were available during my care of the patient were reviewed by me and considered in my medical decision making (see chart for details).  Clinical Course    65 year old female with history of diabetes, hyperlipidemia, bipolar disorder, OSA,  tobacco use presenting with fever and SOB as above. Febrile to 103.8, tachycardic, stable oxygen on room air and normotensive. Workup notable for right sided infiltrate on CXR, leukocytosis, lactic acidosis.   Presentation consistent with sepsis 2/2 CAP. Will give full volume based fluid resuscitation and obtain cultures. Started on abx therapy for CAP with rocephin/azithro. Admitted to stepdown unit for further care.  Case discussed with Dr. Christy Gentles, who oversaw management of this patient.    Final Clinical Impressions(s) / ED Diagnoses   Final diagnoses:  CAP (community acquired pneumonia)  Sepsis, due to unspecified organism [facility])    New Prescriptions Current Discharge Medication List       Ivin Booty, MD 01/05/16 [id number]    Ripley Fraise, MD 01/06/16 [phone]

## 2016-01-04 NOTE — ED Notes (Signed)
Hospitalist, MD at bedside. 

## 2016-01-04 NOTE — ED Triage Notes (Signed)
Pt here with fever of 103.8 orally and cough for several days. Pt unable to tolerate oral intake and was sent here for septic workup by PCP. Pt had xray at PCP that was positive for pneumonia.

## 2016-01-04 NOTE — ED Provider Notes (Signed)
ED ECG REPORT   Date: 01/04/2016 2058  Rate: 119  Rhythm: sinus tachycardia  QRS Axis: right  Intervals: normal  ST/T Wave abnormalities: nonspecific ST changes  Conduction Disutrbances:none  Narrative Interpretation:   I have personally reviewed the EKG tracing and agree with the computerized printout as noted.    Ripley Fraise, MD 01/04/16 2110

## 2016-01-05 ENCOUNTER — Inpatient Hospital Stay (HOSPITAL_COMMUNITY): Payer: Medicare Other

## 2016-01-05 LAB — COMPREHENSIVE METABOLIC PANEL
ALK PHOS: 59 U/L (ref 38–126)
ALT: 30 U/L (ref 14–54)
ALT: 29 U/L (ref 14–54)
ANION GAP: 9 (ref 5–15)
AST: 25 U/L (ref 15–41)
AST: 26 U/L (ref 15–41)
Albumin: 2.5 g/dL — ABNORMAL LOW (ref 3.5–5.0)
Albumin: 2.5 g/dL — ABNORMAL LOW (ref 3.5–5.0)
Alkaline Phosphatase: 59 U/L (ref 38–126)
Anion gap: 8 (ref 5–15)
BILIRUBIN TOTAL: 1.2 mg/dL (ref 0.3–1.2)
BUN: 10 mg/dL (ref 6–20)
BUN: 10 mg/dL (ref 6–20)
CALCIUM: 8.1 mg/dL — AB (ref 8.9–10.3)
CHLORIDE: 112 mmol/L — AB (ref 101–111)
CO2: 19 mmol/L — ABNORMAL LOW (ref 22–32)
CO2: 19 mmol/L — AB (ref 22–32)
CREATININE: 1 mg/dL (ref 0.44–1.00)
Calcium: 8 mg/dL — ABNORMAL LOW (ref 8.9–10.3)
Chloride: 109 mmol/L (ref 101–111)
Creatinine, Ser: 1.03 mg/dL — ABNORMAL HIGH (ref 0.44–1.00)
GFR calc Af Amer: >60 mL/min (ref >60)
GFR calc Af Amer: >60 mL/min (ref >60)
GFR calc non Af Amer: 59 mL/min — ABNORMAL LOW (ref >60)
GFR, EST NON AFRICAN AMERICAN: 57 mL/min — AB (ref >60)
Glucose, Bld: 129 mg/dL — ABNORMAL HIGH (ref 65–99)
Glucose, Bld: 118 mg/dL — ABNORMAL HIGH (ref 65–99)
POTASSIUM: 3.6 mmol/L (ref 3.5–5.1)
Potassium: 3.3 mmol/L — ABNORMAL LOW (ref 3.5–5.1)
SODIUM: 139 mmol/L (ref 135–145)
Sodium: 137 mmol/L (ref 135–145)
TOTAL PROTEIN: 5.9 g/dL — AB (ref 6.5–8.1)
Total Bilirubin: 1.6 mg/dL — ABNORMAL HIGH (ref 0.3–1.2)
Total Protein: 6.1 g/dL — ABNORMAL LOW (ref 6.5–8.1)

## 2016-01-05 LAB — CBC
HCT: 40.3 % (ref 36.0–46.0)
HEMOGLOBIN: 12.7 g/dL (ref 12.0–15.0)
MCH: 28.2 pg (ref 26.0–34.0)
MCHC: 31.5 g/dL (ref 30.0–36.0)
MCV: 89.6 fL (ref 78.0–100.0)
PLATELETS: 260 10*3/uL (ref 150–400)
RBC: 4.5 MIL/uL (ref 3.87–5.11)
RDW: 14.5 % (ref 11.5–15.5)
WBC: 15.2 10*3/uL — AB (ref 4.0–10.5)

## 2016-01-05 LAB — URINE CULTURE: Culture: NO GROWTH

## 2016-01-05 LAB — CBC WITH DIFFERENTIAL/PLATELET
Basophils Absolute: 0 10*3/uL (ref 0.0–0.1)
Basophils Relative: 0 %
Eosinophils Absolute: 0 10*3/uL (ref 0.0–0.7)
Eosinophils Relative: 0 %
HEMATOCRIT: 39.7 % (ref 36.0–46.0)
Hemoglobin: 12.7 g/dL (ref 12.0–15.0)
LYMPHS ABS: 1.5 10*3/uL (ref 0.7–4.0)
LYMPHS PCT: 12 %
MCH: 28.6 pg (ref 26.0–34.0)
MCHC: 32 g/dL (ref 30.0–36.0)
MCV: 89.4 fL (ref 78.0–100.0)
MONO ABS: 0.3 10*3/uL (ref 0.1–1.0)
MONOS PCT: 3 %
NEUTROS ABS: 10.6 10*3/uL — AB (ref 1.7–7.7)
Neutrophils Relative %: 85 %
Platelets: 250 10*3/uL (ref 150–400)
RBC: 4.44 MIL/uL (ref 3.87–5.11)
RDW: 14.5 % (ref 11.5–15.5)
WBC: 12.5 10*3/uL — ABNORMAL HIGH (ref 4.0–10.5)

## 2016-01-05 LAB — PROTIME-INR
INR: 1.31
Prothrombin Time: 16.4 seconds — ABNORMAL HIGH (ref 11.4–15.2)

## 2016-01-05 LAB — MRSA PCR SCREENING: MRSA by PCR: POSITIVE — AB

## 2016-01-05 LAB — PROCALCITONIN: Procalcitonin: 2.21 ng/mL

## 2016-01-05 LAB — GLUCOSE, CAPILLARY
GLUCOSE-CAPILLARY: 162 mg/dL — AB (ref 65–99)
GLUCOSE-CAPILLARY: 147 mg/dL — AB (ref 65–99)
GLUCOSE-CAPILLARY: 117 mg/dL — AB (ref 65–99)
GLUCOSE-CAPILLARY: 141 mg/dL — AB (ref 65–99)

## 2016-01-05 LAB — TROPONIN I
TROPONIN I: 0.07 ng/mL — AB (ref <0.03)
Troponin I: 0.08 ng/mL (ref <0.03)

## 2016-01-05 LAB — HIV ANTIBODY (ROUTINE TESTING W REFLEX): HIV Screen 4th Generation wRfx: NONREACTIVE

## 2016-01-05 LAB — STREP PNEUMONIAE URINARY ANTIGEN: STREP PNEUMO URINARY ANTIGEN: NEGATIVE

## 2016-01-05 LAB — CORTISOL: CORTISOL PLASMA: 13.9 ug/dL

## 2016-01-05 MED ORDER — DEXTROSE 5 % IV SOLN
2.0000 g | Freq: Three times a day (TID) | INTRAVENOUS | Status: DC
  Administered 2016-01-05 – 2016-01-09 (×11): 2 g via INTRAVENOUS
  Filled 2016-01-05 (×16): qty 2

## 2016-01-05 MED ORDER — SODIUM CHLORIDE 0.9 % IV SOLN
INTRAVENOUS | Status: DC
Start: 2016-01-05 — End: 2016-01-09
  Administered 2016-01-05 – 2016-01-08 (×8): via INTRAVENOUS

## 2016-01-05 MED ORDER — SODIUM CHLORIDE 0.9 % IV BOLUS (SEPSIS)
500.0000 mL | Freq: Once | INTRAVENOUS | Status: AC
  Administered 2016-01-05: 500 mL via INTRAVENOUS

## 2016-01-05 MED ORDER — MUPIROCIN 2 % EX OINT
1.0000 "application " | TOPICAL_OINTMENT | Freq: Two times a day (BID) | CUTANEOUS | Status: DC
  Administered 2016-01-05 – 2016-01-09 (×9): 1 via NASAL
  Filled 2016-01-05 (×4): qty 22

## 2016-01-05 MED ORDER — VANCOMYCIN HCL IN DEXTROSE 750-5 MG/150ML-% IV SOLN
750.0000 mg | Freq: Two times a day (BID) | INTRAVENOUS | Status: DC
  Filled 2016-01-05: qty 150

## 2016-01-05 MED ORDER — DEXTROSE 5 % IV SOLN
2.0000 g | Freq: Three times a day (TID) | INTRAVENOUS | Status: DC
  Administered 2016-01-05: 2 g via INTRAVENOUS
  Filled 2016-01-05 (×3): qty 2

## 2016-01-05 MED ORDER — VANCOMYCIN HCL 10 G IV SOLR
2000.0000 mg | Freq: Once | INTRAVENOUS | Status: AC
  Administered 2016-01-05: 2000 mg via INTRAVENOUS
  Filled 2016-01-05: qty 2000

## 2016-01-05 MED ORDER — IPRATROPIUM-ALBUTEROL 0.5-2.5 (3) MG/3ML IN SOLN: 3.0000 mL | Freq: Three times a day (TID) | RESPIRATORY_TRACT | Status: DC

## 2016-01-05 MED ORDER — GUAIFENESIN-DM 100-10 MG/5ML PO SYRP
5.0000 mL | ORAL_SOLUTION | ORAL | Status: DC | PRN
  Administered 2016-01-05 – 2016-01-09 (×11): 5 mL via ORAL
  Filled 2016-01-05 (×11): qty 5

## 2016-01-05 MED ORDER — VANCOMYCIN HCL IN DEXTROSE 750-5 MG/150ML-% IV SOLN
750.0000 mg | Freq: Two times a day (BID) | INTRAVENOUS | Status: DC
  Administered 2016-01-06 – 2016-01-09 (×8): 750 mg via INTRAVENOUS
  Filled 2016-01-05 (×11): qty 150

## 2016-01-05 MED ORDER — CHLORHEXIDINE GLUCONATE CLOTH 2 % EX PADS
6.0000 | MEDICATED_PAD | Freq: Every day | CUTANEOUS | Status: AC
Start: 2016-01-05 — End: 2016-01-09
  Administered 2016-01-05 – 2016-01-09 (×5): 6 via TOPICAL

## 2016-01-05 MED ORDER — PNEUMOCOCCAL VAC POLYVALENT 25 MCG/0.5ML IJ INJ
0.5000 mL | INJECTION | INTRAMUSCULAR | Status: AC
  Administered 2016-01-07: 0.5 mL via INTRAMUSCULAR
  Filled 2016-01-05: qty 0.5

## 2016-01-05 MED ORDER — VANCOMYCIN HCL 10 G IV SOLR
2000.0000 mg | Freq: Once | INTRAVENOUS | Status: DC
  Filled 2016-01-05: qty 2000

## 2016-01-05 MED ORDER — BENZONATATE 100 MG PO CAPS
100.0000 mg | ORAL_CAPSULE | Freq: Three times a day (TID) | ORAL | Status: DC | PRN
  Administered 2016-01-05 – 2016-01-08 (×5): 100 mg via ORAL
  Filled 2016-01-05 (×5): qty 1

## 2016-01-05 MED ORDER — POTASSIUM CHLORIDE CRYS ER 20 MEQ PO TBCR
20.0000 meq | EXTENDED_RELEASE_TABLET | Freq: Once | ORAL | Status: AC
  Administered 2016-01-05: 20 meq via ORAL
  Filled 2016-01-05: qty 1

## 2016-01-05 MED ORDER — IOPAMIDOL (ISOVUE-370) INJECTION 76%
INTRAVENOUS | Status: AC
  Administered 2016-01-05: 100 mL
  Filled 2016-01-05: qty 100

## 2016-01-05 MED ORDER — ACETAMINOPHEN 325 MG PO TABS
650.0000 mg | ORAL_TABLET | Freq: Four times a day (QID) | ORAL | Status: AC | PRN
  Administered 2016-01-05: 650 mg via ORAL
  Filled 2016-01-05: qty 2

## 2016-01-05 NOTE — Progress Notes (Signed)
PROGRESS NOTE  Burgandi Landgrebe  [id number] DOB: 1950-12-17 DOA: 01/04/2016 PCP: Philis Fendt, MD  Outpatient Specialists: [facility], Dr. Corrie Dandy  Brief Narrative: Rittany Havins is a 65 y.o. female 22.5 pack-years smoker with recent pneumonia, OSA on CPAP, bipolar disorder and T2DM who presented 9/7 with fever, productive cough, and dyspnea found to have right perihilar/RML infiltrate on CXR consistent with CAP. This is the 2nd episode of pneumonia in the past year. Work up at arrival showed fever to 103F, dyspnea without hypoxemia, tachycardia, and leukocytosis. IV rocephin and azithromycin were started, cultures drawn, and weight-based IVF's given. Lactate has cleared, WBC trended downward 24.7 to 15.2, PCT 2.21, troponins have been 0.08 and 0.07. She remains with soft BP, tachycardia, though without oxygen requirement.   Assessment & Plan: Principal Problem:   Sepsis ([place]) Active Problems:   CAP (community acquired pneumonia)   Diabetes ([place])   Tobacco user   Leukocytosis   Sinus tachycardia ([place])   AKI (acute kidney injury) ([place])  Sepsis due to community-acquired pneumonia: Improvement in lactate, WBC with initiation of treatment, though tachycardia and tachypnea persist after 4L NS with mild troponin elevation thought to be due to demand ischemia. She also has a recent history of car trip from [place], [place], and her mother had a DVT. HIV neg, Urine strep neg.  - Continue admission to SDU due to risk of decompensation. Low threshold for CCM consult.  - Blood and sputum culture (9/7 - ) - Azithromycin 500mg  x 5 days (9/7 >> 9/11) - Ceftriaxone: 1g daily x 7 days (9/7 >> 9/13) - Supplemental oxygen as needed for hypoxemia; goal SpO2: >90% - Add tessalon with robitussin - Duonebs TID, q4h prn - Incentive spirometry - Due to dyspnea, pleuritic chest pain, refractory tachycardia, +FH DVT, recent travel, and recurrence of pneumonia in smoker will get CT of the chest today.    T2DM:  - Holding victoza - CBGs and SSI  Tobacco use: Ready to quit, brief cessation counseling provided, to follow up with PCP.  - Nicotine patch while inpatient  AKI: Resolved with IVF.  - Continue IVF's along with diet as pt will receive dye load  Hypokalemia: Mild, acute.  - Replete and recheck in AM  DVT prophylaxis: Lovenox Code Status: Full Family Communication: Granddaughter at bedside.  Disposition Plan: If remains stable, not requiring oxygen, transfer to floor in next 24 hours.   Consultants:   None  Procedures:   None  Antimicrobials: - Azithromycin 500mg  x 5 days (9/7 >> 9/11) - Ceftriaxone: 1g daily x 7 days (9/7 >> 9/13)  Subjective: Pt with improved breathing, but did not sleep and declined CPAP yesterday evening, though does not remember this. Continues to have chest pain with coughing. No more fevers. Denies orthopnea.   Objective: Vitals:   01/05/16 0500 01/05/16 0600 01/05/16 0700 01/05/16 0800  BP: 94/67 99/67 95/66    Pulse: (!) 110 (!) 116 (!) 117 (!) 114  Resp: (!) 25 (!) 27 (!) 27   Temp:    99.1 F (37.3 C)  TempSrc:    Oral  SpO2: 95% 96% 95%   Weight:      Height:        Intake/Output Summary (Last 24 hours) at 01/05/16 0918 Last data filed at 01/05/16 0700  Gross per 24 hour  Intake          4046.67 ml  Output  0 ml  Net          4046.67 ml   Filed Weights   01/04/16 1923 01/05/16 0105  Weight: 100.7 kg (222 lb) 106.2 kg (234 lb 2.1 oz)    Examination: General exam: 65 y.o. female in no distress HEENT: MMM, oropharynx clear Respiratory system: Nonlabored breathing room air. Diffuse bilateral wheezing with bibasilar rhonchi and crackles on my exam. Cardiovascular system: Regular rate and rhythm. No murmur, rub, or gallop. No JVD, limited due to habitus, and no pedal edema. Gastrointestinal system: Abdomen soft, non-tender, non-distended, with normoactive bowel sounds. No organomegaly or masses felt. Central  nervous system: Alert and oriented. No focal neurological deficits. Extremities: Warm, no deformities. Negative Homan's.  Skin: No rashes, lesions no ulcers Psychiatry: Judgement and insight appear normal. Mood & affect appropriate.   Data Reviewed: I have personally reviewed following labs and imaging studies  CBC:  Recent Labs Lab 01/04/16 1941 01/05/16 0239 01/05/16 0525  WBC 24.7* 12.5* 15.2*  NEUTROABS 21.1* 10.6*  --   HGB 14.1 12.7 12.7  HCT 43.1 39.7 40.3  MCV 90.4 89.4 89.6  PLT 279 250 [id number]   Basic Metabolic Panel:  Recent Labs Lab 01/04/16 1926 01/05/16 0239 01/05/16 0525  NA 131* 137 139  K 4.3 3.6 3.3*  CL 101 109 112*  CO2 19* 19* 19*  GLUCOSE 168* 129* 118*  BUN 11 10 10   CREATININE 1.22* 1.03* 1.00  CALCIUM 9.5 8.1* 8.0*   GFR: Estimated Creatinine Clearance: 67.2 mL/min (by C-G formula based on SCr of 1 mg/dL). Liver Function Tests:  Recent Labs Lab 01/04/16 1926 01/05/16 0239 01/05/16 0525  AST 33 25 26  ALT 35 30 29  ALKPHOS 81 59 59  BILITOT 1.8* 1.2 1.6*  PROT 7.9 5.9* 6.1*  ALBUMIN 3.4* 2.5* 2.5*   No results for input(s): LIPASE, AMYLASE in the last 168 hours. No results for input(s): AMMONIA in the last 168 hours. Coagulation Profile:  Recent Labs Lab 01/05/16 0012  INR 1.31   Cardiac Enzymes:  Recent Labs Lab 01/05/16 0012 01/05/16 0525  TROPONINI 0.08* 0.07*   BNP (last 3 results) No results for input(s): PROBNP in the last 8760 hours. HbA1C: No results for input(s): HGBA1C in the last 72 hours. CBG:  Recent Labs Lab 01/04/16 2339  GLUCAP 193*   Lipid Profile: No results for input(s): CHOL, HDL, LDLCALC, TRIG, CHOLHDL, LDLDIRECT in the last 72 hours. Thyroid Function Tests: No results for input(s): TSH, T4TOTAL, FREET4, T3FREE, THYROIDAB in the last 72 hours. Anemia Panel: No results for input(s): VITAMINB12, FOLATE, FERRITIN, TIBC, IRON, RETICCTPCT in the last 72 hours. Urine analysis:    Component  Value Date/Time   COLORURINE AMBER (A) 01/04/2016 1926   APPEARANCEUR HAZY (A) 01/04/2016 1926   LABSPEC 1.024 01/04/2016 1926   PHURINE 5.5 01/04/2016 1926   GLUCOSEU NEGATIVE 01/04/2016 1926   HGBUR SMALL (A) 01/04/2016 1926   BILIRUBINUR SMALL (A) 01/04/2016 1926   KETONESUR 15 (A) 01/04/2016 1926   PROTEINUR 100 (A) 01/04/2016 1926   UROBILINOGEN 1.0 04/02/2014 1230   NITRITE NEGATIVE 01/04/2016 1926   LEUKOCYTESUR TRACE (A) 01/04/2016 1926   Sepsis Labs: @LABRCNTIP (procalcitonin:4,lacticidven:4)  ) Recent Results (from the past 240 hour(s))  Culture, blood (Routine X 2)     Status: None (Preliminary result)   Collection Time: 01/04/16  7:35 PM  Result Value Ref Range Status   Specimen Description BLOOD LEFT ANTECUBITAL  Final   Special Requests IN PEDIATRIC BOTTLE [place]  Final   Culture NO GROWTH < 12 HOURS  Final   Report Status PENDING  Incomplete  Culture, blood (Routine X 2)     Status: None (Preliminary result)   Collection Time: 01/04/16  7:56 PM  Result Value Ref Range Status   Specimen Description BLOOD RIGHT ARM  Final   Special Requests BOTTLES DRAWN AEROBIC AND ANAEROBIC 5CC  Final   Culture NO GROWTH < 12 HOURS  Final   Report Status PENDING  Incomplete  MRSA PCR Screening     Status: Abnormal   Collection Time: 01/05/16  1:12 AM  Result Value Ref Range Status   MRSA by PCR POSITIVE (A) NEGATIVE Final    Comment:        The GeneXpert MRSA Assay (FDA approved for NASAL specimens only), is one component of a comprehensive MRSA colonization surveillance program. It is not intended to diagnose MRSA infection nor to guide or monitor treatment for MRSA infections. RESULT CALLED TO, READ BACK BY AND VERIFIED WITH: TOMLINSON,RN @0338  01/05/16 [facility]      Radiology Studies: Dg Chest 2 View  Result Date: 01/04/2016 CLINICAL DATA:  65 year old female with cough EXAM: CHEST  2 VIEW COMPARISON:  Chest radiograph dated 08/29/2015 FINDINGS: Two views of the chest  demonstrate a focal area of opacity in the right hilar/right middle lobe, new from prior study and most likely pneumonia. Underlying mass is not excluded. Follow-up to resolution is recommended. There is no pleural effusion or pneumothorax. The cardiac silhouette is within normal limits. No acute osseous pathology. IMPRESSION: Focal right perihilar/right middle lobe opacity. Findings most likely represent pneumonia. Correlation with clinical exam and follow-up to resolution is recommended to exclude underlying mass. Electronically Signed   By: Anner Crete M.D.   On: 01/04/2016 20:57    Scheduled Meds: . azithromycin  500 mg Intravenous Q24H  . cefTRIAXone (ROCEPHIN)  IV  1 g Intravenous Q24H  . Chlorhexidine Gluconate Cloth  6 each Topical Q0600  . enoxaparin (LOVENOX) injection  40 mg Subcutaneous Q24H  . fluticasone  2 spray Each Nare Daily  . guaiFENesin  600 mg Oral BID  . insulin aspart  0-20 Units Subcutaneous TID WC  . insulin aspart  0-5 Units Subcutaneous QHS  . ipratropium-albuterol  3 mL Nebulization TID  . mupirocin ointment  1 application Nasal BID  . nicotine  14 mg Transdermal Daily  . [START ON 01/07/2016] pneumococcal 23 valent vaccine  0.5 mL Intramuscular Tomorrow-1000  . potassium chloride  20 mEq Oral Once  . pregabalin  75 mg Oral TID  . simvastatin  10 mg Oral Daily  . topiramate  25 mg Oral Daily  . traZODone  50 mg Oral QHS   Continuous Infusions: . sodium chloride 100 mL/hr at 01/05/16 0432     LOS: 1 day   Time spent: 25 minutes.  Vance Gather, MD [facility] Pager [phone]  If 7PM-7AM, please contact night-coverage [email or web address] Password [email or web address] 01/05/2016, 9:18 AM

## 2016-01-05 NOTE — Progress Notes (Signed)
Pt was wearing a ring on her R hand, fingers are a little swollen so ring was removed and placed in a specimen cup. Cup was then placed in her belonging bag from home.

## 2016-01-05 NOTE — Progress Notes (Signed)
Dr. Bonner Puna notified of temp 101.4, pt complaining of headache.  Also has remained tachypneic but has not needed any oxygen with sats 96-98%. Breath sounds rhonchus with coarse sounds audible without a stethescope.  Dr. Bonner Puna to come reassess patient.  Will continue to monitor pt closely.

## 2016-01-05 NOTE — Progress Notes (Signed)
CRITICAL VALUE ALERT  Critical value received:  Trop 0.08  Date of notification:  01/05/2016  Time of notification:  0130  Critical value read back:Yes.    Nurse who received alert:  Arnetha Courser, RN  MD notified (1st Gearldean [place]):  [facility]  Time of first Felicidad Sugarman:  [phone]  Responding MD:    Time MD responded:

## 2016-01-05 NOTE — Progress Notes (Signed)
Pharmacy Antibiotic Note  Melanie Reynolds is a 65 y.o. female admitted on 01/04/2016 with pneumonia.  Pharmacy has been consulted for vancomycin/cefepime dosing. Previously on ceftriaxone/azithromycin for CAP. WBC up 15.2, Tmax/24h 103.8, normalized CrCl~65.  Plan: Cefepime 2g IV q8h Vancomycin 2g IV x1; then 750mg  IV q12h Monitor clinical progress, c/s, renal function, abx plan/LOT VT@SS  as indicated   Height: 5\' 3"  (160 cm) Weight: 234 lb 2.1 oz (106.2 kg) IBW/kg (Calculated) : 52.4  Temp (24hrs), Avg:101.2 F (38.4 C), Min:98.6 F (37 C), Max:103.8 F (39.9 C)   Recent Labs Lab 01/04/16 1926 01/04/16 1941 01/04/16 2015 01/04/16 2217 01/05/16 0239 01/05/16 0525  WBC  --  24.7*  --   --  12.5* 15.2*  CREATININE 1.22*  --   --   --  1.03* 1.00  LATICACIDVEN  --   --  3.05* 1.76  --   --     Estimated Creatinine Clearance: 67.2 mL/min (by C-G formula based on SCr of 1 mg/dL).    Allergies  Allergen Reactions  . Latex Itching  . Naproxen Other (See Comments)    Abdominal pain    Antimicrobials this admission: 9/8 cefepime >>  9/8 vancomycin >>  Rocephin 9/7 >> Azithro 9/7 >>  Dose adjustments this admission:   Microbiology results: 9/7 BCx: neg to date 9/7 UCx:  neg 9/8 MRSA PCR: positive 9/7 strep pneumo ur antigen: neg 9/7 legionella: ip   Elicia Lamp, PharmD, [facility] Clinical Pharmacist Pager [phone] 01/05/2016 4:20 PM

## 2016-01-06 ENCOUNTER — Inpatient Hospital Stay (HOSPITAL_COMMUNITY): Payer: Medicare Other

## 2016-01-06 DIAGNOSIS — R0600 Dyspnea, unspecified: Secondary | ICD-10-CM

## 2016-01-06 DIAGNOSIS — R000 Tachycardia, unspecified: Secondary | ICD-10-CM

## 2016-01-06 DIAGNOSIS — J189 Pneumonia, unspecified organism: Secondary | ICD-10-CM

## 2016-01-06 LAB — ECHOCARDIOGRAM COMPLETE
AOASC: 36 cm
AVLVOTPG: 11 mmHg
CHL CUP DOP CALC LVOT VTI: 28.7 cm
E decel time: 176 msec
E/e' ratio: 9.5
FS: 39 % (ref 28–44)
HEIGHTINCHES: 63 in
IVS/LV PW RATIO, ED: 0.94
LA ID, A-P, ES: 34 mm
LA diam end sys: 34 mm
LA diam index: 1.64 cm/m2
LA vol A4C: 35.3 ml
LA vol: 41.1 mL
LAVOLIN: 19.9 mL/m2
LDCA: 2.54 cm2
LV E/e'average: 9.5
LV PW d: 14.9 mm — AB (ref 0.6–1.1)
LV TDI E'MEDIAL: 8.81
LVEEMED: 9.5
LVELAT: 12 cm/s
LVOT diameter: 18 mm
LVOTPV: 168 cm/s
LVOTSV: 73 mL
Lateral S' vel: 23.6 cm/s
MV Dec: 176
MV pk A vel: 87.9 m/s
MV pk E vel: 114 m/s
MVPG: 5 mmHg
RV TAPSE: 27.7 mm
TDI e' lateral: 12
WEIGHTICAEL: 3746.06 [oz_av]

## 2016-01-06 LAB — BASIC METABOLIC PANEL
ANION GAP: 10 (ref 5–15)
BUN: 8 mg/dL (ref 6–20)
CO2: 18 mmol/L — ABNORMAL LOW (ref 22–32)
Calcium: 8.1 mg/dL — ABNORMAL LOW (ref 8.9–10.3)
Chloride: 112 mmol/L — ABNORMAL HIGH (ref 101–111)
Creatinine, Ser: 0.95 mg/dL (ref 0.44–1.00)
GFR calc Af Amer: >60 mL/min (ref >60)
GLUCOSE: 80 mg/dL (ref 65–99)
POTASSIUM: 4.3 mmol/L (ref 3.5–5.1)
SODIUM: 140 mmol/L (ref 135–145)

## 2016-01-06 LAB — GLUCOSE, CAPILLARY
GLUCOSE-CAPILLARY: 114 mg/dL — AB (ref 65–99)
GLUCOSE-CAPILLARY: 135 mg/dL — AB (ref 65–99)
GLUCOSE-CAPILLARY: 139 mg/dL — AB (ref 65–99)
Glucose-Capillary: 129 mg/dL — ABNORMAL HIGH (ref 65–99)
Glucose-Capillary: 150 mg/dL — ABNORMAL HIGH (ref 65–99)

## 2016-01-06 LAB — CBC
HCT: 39.1 % (ref 36.0–46.0)
Hemoglobin: 12.4 g/dL (ref 12.0–15.0)
MCH: 29 pg (ref 26.0–34.0)
MCHC: 31.7 g/dL (ref 30.0–36.0)
MCV: 91.4 fL (ref 78.0–100.0)
PLATELETS: 277 10*3/uL (ref 150–400)
RBC: 4.28 MIL/uL (ref 3.87–5.11)
RDW: 14.8 % (ref 11.5–15.5)
WBC: 17.7 10*3/uL — AB (ref 4.0–10.5)

## 2016-01-06 LAB — EXPECTORATED SPUTUM ASSESSMENT W REFEX TO RESP CULTURE

## 2016-01-06 LAB — HEMOGLOBIN A1C
HEMOGLOBIN A1C: 6.2 % — AB (ref 4.8–5.6)
Mean Plasma Glucose: 131 mg/dL

## 2016-01-06 LAB — EXPECTORATED SPUTUM ASSESSMENT W GRAM STAIN, RFLX TO RESP C

## 2016-01-06 MED ORDER — ACETAMINOPHEN 325 MG PO TABS
650.0000 mg | ORAL_TABLET | Freq: Four times a day (QID) | ORAL | Status: DC | PRN
  Administered 2016-01-06 – 2016-01-08 (×5): 650 mg via ORAL
  Filled 2016-01-06 (×5): qty 2

## 2016-01-06 MED ORDER — CHLORHEXIDINE GLUCONATE 0.12 % MT SOLN
15.0000 mL | Freq: Two times a day (BID) | OROMUCOSAL | Status: DC
  Administered 2016-01-06 – 2016-01-09 (×7): 15 mL via OROMUCOSAL
  Filled 2016-01-06 (×7): qty 15

## 2016-01-06 MED ORDER — PERFLUTREN LIPID MICROSPHERE
INTRAVENOUS | Status: AC
  Administered 2016-01-06: 2 mL
  Filled 2016-01-06: qty 10

## 2016-01-06 MED ORDER — PERFLUTREN LIPID MICROSPHERE
1.0000 mL | INTRAVENOUS | Status: DC | PRN
  Filled 2016-01-06: qty 10

## 2016-01-06 NOTE — Consult Note (Signed)
[facility] for Infectious Disease    Date of Admission:  01/04/2016    Total days of antibiotics 3        Day 2 vancomycin        Day 2 cefepime              Reason for Consult: Community-acquired pneumonia    Referring Physician: Dr. Vance Gather Primary Care Physician: Dr. Libby Maw  Principal Problem:   CAP (community acquired pneumonia) Active Problems:   Sepsis ([place])   Bipolar 1 disorder ([place])   Hypotension   Chronic pain   Diabetes ([place])   OSA (obstructive sleep apnea)   Tobacco user   Obesity   AKI (acute kidney injury) ([place])   . ceFEPime (MAXIPIME) IV  2 g Intravenous Q8H  . chlorhexidine  15 mL Mouth/Throat BID  . Chlorhexidine Gluconate Cloth  6 each Topical Q0600  . enoxaparin (LOVENOX) injection  40 mg Subcutaneous Q24H  . fluticasone  2 spray Each Nare Daily  . guaiFENesin  600 mg Oral BID  . insulin aspart  0-20 Units Subcutaneous TID WC  . insulin aspart  0-5 Units Subcutaneous QHS  . mupirocin ointment  1 application Nasal BID  . nicotine  14 mg Transdermal Daily  . [START ON 01/07/2016] pneumococcal 23 valent vaccine  0.5 mL Intramuscular Tomorrow-1000  . pregabalin  75 mg Oral TID  . simvastatin  10 mg Oral Daily  . topiramate  25 mg Oral Daily  . traZODone  50 mg Oral QHS  . vancomycin  750 mg Intravenous Q12H    Recommendations: 1. Continue current antibiotic therapy pending final culture results and further observation   Assessment: She has community acquired pneumonia with dense right upper lobe infiltrate that is progressing rapidly. I agree with empiric broadening of her antibiotics pending the results of sputum Gram stain culture and final blood cultures. I will follow up in the morning.    HPI: Melanie Reynolds is a 65 y.o. female with obesity and obstructive sleep apnea. She continues to smoke cigarettes. She has a history of pneumonia 2 years ago. 2 weeks ago she began to have productive cough. She was bringing up yellow  sputum that is now turning green. She has noted increased shortness of breath and some central, nonpleuritic chest pain. She had subjective fevers with chills and sweats. This led to her admission 2 days ago. She had a temperature of 103.8 and a dense right perihilar infiltrate.  She was started on ceftriaxone and azithromycin that has had persistent high fevers. Chest CT scan shows significant progression of her dense right upper lobe infiltrate as well as some patchy left upper lobe infiltrate. She was switched to vancomycin and cefepime yesterday and states that she is feeling a little bit better today. Her nasal swab was positive for MRSA. 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   Review of Systems: Review of Systems  Constitutional: Positive for chills, diaphoresis, fever, malaise/fatigue and weight loss.  HENT: Negative for sore throat.   Respiratory: Positive for cough, sputum production and shortness of breath. Negative for hemoptysis and wheezing.   Cardiovascular: Positive for chest pain.  Gastrointestinal: Negative for abdominal pain, diarrhea, nausea and vomiting.  Genitourinary: Negative for dysuria.  Musculoskeletal: Positive for back pain. Negative for joint pain and myalgias.       She has chronic pain  but states that now she has pain all over.  Skin: Negative for rash.  Neurological: Negative for headaches.  Psychiatric/Behavioral: Negative for depression and substance abuse. The patient is not nervous/anxious.     Past Medical History:  Diagnosis Date  . Anxiety   . Arthritis    "knees, legs, back" (06/01/2013)  . Bipolar depression ([place])   . Chronic lower back pain   . High cholesterol   . OSA (obstructive sleep apnea)   . Pneumonia 1970's; 1980's; 06/01/2013   "total of 3 times"  . Type II diabetes mellitus ()     Social History  Substance Use Topics  . Smoking status: Current Every Day Smoker     Packs/day: 0.50    Years: 45.00    Types: Cigarettes  . Smokeless tobacco: Never Used     Comment: h/o 1 PPD  . Alcohol use Yes     Comment: 06/01/2013 "glass of wine maybe q 6 months, if that"    Family History  Problem Relation Age of Onset  . Kidney failure Mother    Allergies  Allergen Reactions  . Latex Itching  . Naproxen Other (See Comments)    Abdominal pain    OBJECTIVE: Blood pressure 135/80, pulse (!) 115, temperature (!) 100.9 F (38.3 C), temperature source Oral, resp. rate (!) 34, height 5\' 3"  (1.6 m), weight 234 lb 2.1 oz (106.2 kg), SpO2 97 %.  Physical Exam  Constitutional: She is oriented to person, place, and time.  She is resting quietly in bed. She appears comfortable and in no distress.  HENT:  Mouth/Throat: No oropharyngeal exudate.  Eyes: Conjunctivae are normal.  Cardiovascular: Normal rate and regular rhythm.   No murmur heard. Distant heart sounds  Pulmonary/Chest: Effort normal. She has no wheezes. She has no rales.  She has diminished breath sounds on the right.  Abdominal: Soft. There is no tenderness.  Musculoskeletal: Normal range of motion. She exhibits no edema or tenderness.  Neurological: She is alert and oriented to person, place, and time.  Skin: No rash noted.  Psychiatric: Mood and affect normal.    Lab Results Lab Results  Component Value Date   WBC 17.7 (H) 01/06/2016   HGB 12.4 01/06/2016   HCT 39.1 01/06/2016   MCV 91.4 01/06/2016   PLT 277 01/06/2016    Lab Results  Component Value Date   CREATININE 0.95 01/06/2016   BUN 8 01/06/2016   NA 140 01/06/2016   K 4.3 01/06/2016   CL 112 (H) 01/06/2016   CO2 18 (L) 01/06/2016    Lab Results  Component Value Date   ALT 29 01/05/2016   AST 26 01/05/2016   ALKPHOS 59 01/05/2016   BILITOT 1.6 (H) 01/05/2016     Microbiology: Recent Results (from the past 240 hour(s))  Urine culture     Status: None   Collection Time: 01/04/16  7:26 PM  Result Value Ref Range  Status   Specimen Description URINE, RANDOM  Final   Special Requests NONE  Final   Culture NO GROWTH  Final   Report Status 01/05/2016 FINAL  Final  Culture, blood (Routine X 2)     Status: None (Preliminary result)   Collection Time: 01/04/16  7:35 PM  Result Value Ref Range Status   Specimen Description BLOOD LEFT ANTECUBITAL  Final   Special Requests IN PEDIATRIC BOTTLE 1CC  Final   Culture NO GROWTH 2 DAYS  Final   Report Status PENDING  Incomplete  Culture,  blood (Routine X 2)     Status: None (Preliminary result)   Collection Time: 01/04/16  7:56 PM  Result Value Ref Range Status   Specimen Description BLOOD RIGHT ARM  Final   Special Requests BOTTLES DRAWN AEROBIC AND ANAEROBIC 5CC  Final   Culture NO GROWTH 2 DAYS  Final   Report Status PENDING  Incomplete  MRSA PCR Screening     Status: Abnormal   Collection Time: 01/05/16  1:12 AM  Result Value Ref Range Status   MRSA by PCR POSITIVE (A) NEGATIVE Final    Comment:        The GeneXpert MRSA Assay (FDA approved for NASAL specimens only), is one component of a comprehensive MRSA colonization surveillance program. It is not intended to diagnose MRSA infection nor to guide or monitor treatment for MRSA infections. RESULT CALLED TO, READ BACK BY AND VERIFIED WITH: TOMLINSON,RN @0338  01/05/16 MKELLY   Culture, expectorated sputum-assessment     Status: None   Collection Time: 01/06/16 10:10 AM  Result Value Ref Range Status   Specimen Description SPUTUM  Final   Special Requests NONE  Final   Sputum evaluation   Final    THIS SPECIMEN IS ACCEPTABLE. RESPIRATORY CULTURE REPORT TO FOLLOW.   Report Status 01/06/2016 FINAL  Final    Michel Bickers, MD [place] for Infectious [facility] [phone] pager   [phone] cell 01/06/2016, 12:41 PM

## 2016-01-06 NOTE — Progress Notes (Signed)
RN placed patient on CPAP. No O2 bleed in needed. Patient tolerating well.

## 2016-01-06 NOTE — Progress Notes (Signed)
Dr. Bonner Puna notified of pt temp 103.9 orally. Patient has bed assigned on [address]. Is okay to transfer patient at this time.

## 2016-01-06 NOTE — Progress Notes (Signed)
Received pt from [place]. CHG bath completed. Pt temp on arrival 103.0 orally.

## 2016-01-06 NOTE — Progress Notes (Signed)
CT showing worsening pneumonia. Patient resting comfortably at around 3 :30 am. Has periods of coughing with stress urinary incontinence. Otherwise without complaint.  Vitals:   01/05/16 2320 01/05/16 2359  BP: (!) 98/59   Pulse: (!) 110 (!) 112  Resp: (!) 33 (!) 31  Temp:     Continue current management. Close monitoring.

## 2016-01-06 NOTE — Progress Notes (Signed)
PROGRESS NOTE  Melanie Reynolds  [id number] DOB: March 19, 1951 DOA: 01/04/2016 PCP: Philis Fendt, MD  Outpatient Specialists: [facility], Dr. Corrie Dandy  Brief Narrative: Melanie Reynolds is a 65 y.o. female 22.5 pack-years smoker with recent pneumonia, OSA on CPAP, bipolar disorder and T2DM who presented 9/7 with fever, productive cough, and dyspnea found to have right perihilar/RML infiltrate on CXR consistent with CAP. This is the 2nd episode of pneumonia in the past couple years. Work up at arrival showed fever to 103F, dyspnea without hypoxemia, tachycardia, and leukocytosis. IV rocephin and azithromycin were started, cultures drawn, and weight-based IVF's given. Lactate has cleared, WBC trended initially downward 24.7 to 12.5, PCT 2.21, troponins have been 0.08 and 0.07. She remains with soft BP, tachypnea and sinus tachycardia, though without oxygen requirement. WBC has slowly trended upward and vital signs have not improved significantly, so abx were broadened to include vancomycin in pt with +MRSA screen on 9/8. CT chest was ordered showing no PE and confluence of RUL infiltrate and LUL infiltrate concerning for multifocal pneumonia.   Assessment & Plan: Principal Problem:   Sepsis ([place]) Active Problems:   CAP (community acquired pneumonia)   Diabetes ([place])   Tobacco user   Leukocytosis   Sinus tachycardia ([place])   AKI (acute kidney injury) ([place])  Sepsis due to community-acquired pneumonia: With colonization or MRSA and reported prior infection, abx were broadened with no perceivable improvement. Tachycardia and tachypnea persist after 4L NS with mild troponin elevation thought to be due to demand ischemia. HIV neg, Urine strep neg.  - Continue admission to SDU due to risk of decompensation. Low threshold for ABG if work of breathing increases, or CCM consult.  - Blood culture (9/7 - ) NGTD; asked to recollect sputum for culture.  - Azithromycin and rocephin (9/7 >> 9/8) - Vancomycin  and cefepime (9/8 >> ) - No hypoxemia; O2 by Goodnight prn for SpO2: >90% - Tessalon, robitussin, IS - Duonebs TID, q4h prn - Would appreciate ID input  T2DM:  - Holding victoza - CBGs and SSI  Tobacco use: Ready to quit, brief cessation counseling provided, to follow up with PCP.  - Nicotine patch while inpatient  AKI: Resolved with IVF.  - Continue IVF's along with diet as pt received dye load and has no history of CHF.  Hypokalemia: Mild, acute.  - Replete and recheck in AM  DVT prophylaxis: Lovenox Code Status: Full Family Communication: Granddaughter at bedside.  Disposition Plan: If remains stable, not requiring oxygen, transfer to floor in next 24 hours.   Consultants:   Infectious disease consulted 9/9  Procedures:   None  Antimicrobials: - Azithromycin, CTX (9/7 >> 9/8) - Vancomycin, cefepime (9/8 >> )  Subjective: "Breathing is better" but still fast with significant cough. Chest pain and stress urinary incontinence with the coughing. CPAP helped but continued to have fevers overnight.   Objective: Vitals:   01/05/16 2359 01/06/16 0400 01/06/16 0443 01/06/16 0545  BP:   (!) 141/79   Pulse: (!) 112  (!) 114   Resp: (!) 31  (!) 30   Temp:  (!) 102.7 F (39.3 C)  (!) 101.7 F (38.7 C)  TempSrc:  Oral  Axillary  SpO2: 97%  97%   Weight:      Height:        Intake/Output Summary (Last 24 hours) at 01/06/16 0755 Last data filed at 01/06/16 0600  Gross per 24 hour  Intake  3690 ml  Output              150 ml  Net             3540 ml   Filed Weights   01/04/16 1923 01/05/16 0105  Weight: 100.7 kg (222 lb) 106.2 kg (234 lb 2.1 oz)    Examination: General exam: 65 y.o. female in no distress HEENT: MMM, oropharynx clear Respiratory system: Nonlabored, but tachypneic, breathing room air. Decreased wheezing from prior exams with decreased air movement/crackles/rhonchi in bilateral upper lung zones. No crackles at bases.  Cardiovascular system:  Tachycardic but regular. No murmur, rub, or gallop. No JVD, limited due to habitus, and no pedal edema. Gastrointestinal system: Abdomen soft, non-tender, non-distended, with normoactive bowel sounds. No organomegaly or masses felt. Central nervous system: Alert and oriented. No focal neurological deficits. Extremities: Warm, no deformities. Negative Homan's.  Skin: No rashes, lesions no ulcers Psychiatry: Judgement and insight appear normal. Mood & affect appropriate.   Data Reviewed: I have personally reviewed following labs and imaging studies  CBC:  Recent Labs Lab 01/04/16 1941 01/05/16 0239 01/05/16 0525 01/06/16 0212  WBC 24.7* 12.5* 15.2* 17.7*  NEUTROABS 21.1* 10.6*  --   --   HGB 14.1 12.7 12.7 12.4  HCT 43.1 39.7 40.3 39.1  MCV 90.4 89.4 89.6 91.4  PLT 279 250 260 [id number]   Basic Metabolic Panel:  Recent Labs Lab 01/04/16 1926 01/05/16 0239 01/05/16 0525 01/06/16 0212  NA 131* 137 139 140  K 4.3 3.6 3.3* 4.3  CL 101 109 112* 112*  CO2 19* 19* 19* 18*  GLUCOSE 168* 129* 118* 80  BUN 11 10 10 8   CREATININE 1.22* 1.03* 1.00 0.95  CALCIUM 9.5 8.1* 8.0* 8.1*   GFR: Estimated Creatinine Clearance: 70.7 mL/min (by C-G formula based on SCr of 0.95 mg/dL). Liver Function Tests:  Recent Labs Lab 01/04/16 1926 01/05/16 0239 01/05/16 0525  AST 33 25 26  ALT 35 30 29  ALKPHOS 81 59 59  BILITOT 1.8* 1.2 1.6*  PROT 7.9 5.9* 6.1*  ALBUMIN 3.4* 2.5* 2.5*   No results for input(s): LIPASE, AMYLASE in the last 168 hours. No results for input(s): AMMONIA in the last 168 hours. Coagulation Profile:  Recent Labs Lab 01/05/16 0012  INR 1.31   Cardiac Enzymes:  Recent Labs Lab 01/05/16 0012 01/05/16 0525  TROPONINI 0.08* 0.07*   BNP (last 3 results) No results for input(s): PROBNP in the last 8760 hours. HbA1C:  Recent Labs  01/04/16 2333  HGBA1C 6.2*   CBG:  Recent Labs Lab 01/05/16 0908 01/05/16 1409 01/05/16 1605 01/05/16 2101 01/05/16 2201   GLUCAP 162* 147* 117* 141* 114*   Lipid Profile: No results for input(s): CHOL, HDL, LDLCALC, TRIG, CHOLHDL, LDLDIRECT in the last 72 hours. Thyroid Function Tests: No results for input(s): TSH, T4TOTAL, FREET4, T3FREE, THYROIDAB in the last 72 hours. Anemia Panel: No results for input(s): VITAMINB12, FOLATE, FERRITIN, TIBC, IRON, RETICCTPCT in the last 72 hours. Urine analysis:    Component Value Date/Time   COLORURINE AMBER (A) 01/04/2016 1926   APPEARANCEUR HAZY (A) 01/04/2016 1926   LABSPEC 1.024 01/04/2016 1926   PHURINE 5.5 01/04/2016 1926   GLUCOSEU NEGATIVE 01/04/2016 1926   HGBUR SMALL (A) 01/04/2016 1926   BILIRUBINUR SMALL (A) 01/04/2016 1926   KETONESUR 15 (A) 01/04/2016 1926   PROTEINUR 100 (A) 01/04/2016 1926   UROBILINOGEN 1.0 04/02/2014 1230   NITRITE NEGATIVE 01/04/2016 1926   LEUKOCYTESUR TRACE (A)  01/04/2016 1926   Sepsis Labs: @LABRCNTIP (procalcitonin:4,lacticidven:4)  ) Recent Results (from the past 240 hour(s))  Urine culture     Status: None   Collection Time: 01/04/16  7:26 PM  Result Value Ref Range Status   Specimen Description URINE, RANDOM  Final   Special Requests NONE  Final   Culture NO GROWTH  Final   Report Status 01/05/2016 FINAL  Final  Culture, blood (Routine X 2)     Status: None (Preliminary result)   Collection Time: 01/04/16  7:35 PM  Result Value Ref Range Status   Specimen Description BLOOD LEFT ANTECUBITAL  Final   Special Requests IN PEDIATRIC BOTTLE 1CC  Final   Culture NO GROWTH < 12 HOURS  Final   Report Status PENDING  Incomplete  Culture, blood (Routine X 2)     Status: None (Preliminary result)   Collection Time: 01/04/16  7:56 PM  Result Value Ref Range Status   Specimen Description BLOOD RIGHT ARM  Final   Special Requests BOTTLES DRAWN AEROBIC AND ANAEROBIC 5CC  Final   Culture NO GROWTH < 12 HOURS  Final   Report Status PENDING  Incomplete  MRSA PCR Screening     Status: Abnormal   Collection Time: 01/05/16   1:12 AM  Result Value Ref Range Status   MRSA by PCR POSITIVE (A) NEGATIVE Final    Comment:        The GeneXpert MRSA Assay (FDA approved for NASAL specimens only), is one component of a comprehensive MRSA colonization surveillance program. It is not intended to diagnose MRSA infection nor to guide or monitor treatment for MRSA infections. RESULT CALLED TO, READ BACK BY AND VERIFIED WITH: TOMLINSON,RN @0338  01/05/16 [facility]      Radiology Studies: Dg Chest 2 View  Result Date: 01/04/2016 CLINICAL DATA:  65 year old female with cough EXAM: CHEST  2 VIEW COMPARISON:  Chest radiograph dated 08/29/2015 FINDINGS: Two views of the chest demonstrate a focal area of opacity in the right hilar/right middle lobe, new from prior study and most likely pneumonia. Underlying mass is not excluded. Follow-up to resolution is recommended. There is no pleural effusion or pneumothorax. The cardiac silhouette is within normal limits. No acute osseous pathology. IMPRESSION: Focal right perihilar/right middle lobe opacity. Findings most likely represent pneumonia. Correlation with clinical exam and follow-up to resolution is recommended to exclude underlying mass. Electronically Signed   By: Anner Crete M.D.   On: 01/04/2016 20:57   Ct Angio Chest Pe W Or Wo Contrast  Result Date: 01/05/2016 CLINICAL DATA:  Recent car trip from [place]. Dyspnea with chest pain and tachycardia. Evaluate for pulmonary embolus. EXAM: CT ANGIOGRAPHY CHEST WITH CONTRAST TECHNIQUE: Multidetector CT imaging of the chest was performed using the standard protocol during bolus administration of intravenous contrast. Multiplanar CT image reconstructions and MIPs were obtained to evaluate the vascular anatomy. CONTRAST:  100 cc Isovue 370 COMPARISON:  Chest x-ray from yesterday. FINDINGS: Cardiovascular: Heart is borderline enlarged. No pericardial effusion. No thoracic aortic aneurysm. No dissection of the thoracic aorta. Study is  degraded by image noise resulting from breathing motion and body habitus. Within this limitation, there is no large central pulmonary embolus identified. No evidence for filling defect in the lobar or segmental pulmonary arteries to suggest acute pulmonary embolus. Subsegmental pulmonary artery show no definite pulmonary embolic disease butter less reliably evaluated. Mediastinum/Nodes: 10 mm short axis right paratracheal lymph node is associated with a 10 mm short axis subcarinal lymph node and 11 mm  short axis AP window lymph node. Lungs/Pleura: There is dense airspace consolidation in the right upper lobe, clearly progressed since yesterday's x-ray. Compressive atelectasis noted in the dependent lower lobes bilaterally. No features to suggest pulmonary edema. There is some minimal peripheral subpleural airspace disease in the left upper lobe. Upper Abdomen: Unremarkable. Musculoskeletal: Bone windows reveal no worrisome lytic or sclerotic osseous lesions. Review of the MIP images confirms the above findings. IMPRESSION: 1. Limited study without evidence for large central pulmonary embolus in the main pulmonary arteries, lobar pulmonary arteries, or proximal segmental pulmonary arteries. Some segmental and the subsegmental pulmonary arteries show no evidence of embolus but are not reliably evaluated secondary to image noise resulting from body habitus and breathing motion. 2. Interval marked progression since yesterday's chest x-ray of right upper lobe airspace disease which is now confluent. Imaging features are compatible with progressive right upper lobe pneumonia. There is since associated patchy peripheral airspace disease in the left upper lobe and multifocal pneumonia could have this appearance. Follow-up imaging recommended to ensure complete resolution of the right upper lobe disease and exclude underlying central obstructing lesion. 3. Borderline mediastinal lymphadenopathy likely reactive to the right  upper lobe disease. Electronically Signed   By: Misty Stanley M.D.   On: 01/05/2016 19:33    Scheduled Meds: . ceFEPime (MAXIPIME) IV  2 g Intravenous Q8H  . chlorhexidine  15 mL Mouth/Throat BID  . Chlorhexidine Gluconate Cloth  6 each Topical Q0600  . enoxaparin (LOVENOX) injection  40 mg Subcutaneous Q24H  . fluticasone  2 spray Each Nare Daily  . guaiFENesin  600 mg Oral BID  . insulin aspart  0-20 Units Subcutaneous TID WC  . insulin aspart  0-5 Units Subcutaneous QHS  . mupirocin ointment  1 application Nasal BID  . nicotine  14 mg Transdermal Daily  . [START ON 01/07/2016] pneumococcal 23 valent vaccine  0.5 mL Intramuscular Tomorrow-1000  . pregabalin  75 mg Oral TID  . simvastatin  10 mg Oral Daily  . topiramate  25 mg Oral Daily  . traZODone  50 mg Oral QHS  . vancomycin  750 mg Intravenous Q12H   Continuous Infusions: . sodium chloride 100 mL/hr at 01/06/16 0703     LOS: 2 days   Time spent: 25 minutes.  Vance Gather, MD [facility] Pager [phone]  If 7PM-7AM, please contact night-coverage [email or web address] Password [email or web address] 01/06/2016, 7:55 AM

## 2016-01-06 NOTE — Progress Notes (Signed)
  Echocardiogram 2D Echocardiogram has been performed.  Melanie Reynolds R 01/06/2016, 12:00 PM

## 2016-01-07 DIAGNOSIS — A419 Sepsis, unspecified organism: Secondary | ICD-10-CM | POA: Diagnosis not present

## 2016-01-07 LAB — CBC
HCT: 37.1 % (ref 36.0–46.0)
HEMOGLOBIN: 11.7 g/dL — AB (ref 12.0–15.0)
MCH: 28.3 pg (ref 26.0–34.0)
MCHC: 31.5 g/dL (ref 30.0–36.0)
MCV: 89.6 fL (ref 78.0–100.0)
PLATELETS: 272 10*3/uL (ref 150–400)
RBC: 4.14 MIL/uL (ref 3.87–5.11)
RDW: 14.7 % (ref 11.5–15.5)
WBC: 12.3 10*3/uL — ABNORMAL HIGH (ref 4.0–10.5)

## 2016-01-07 LAB — BASIC METABOLIC PANEL
Anion gap: 10 (ref 5–15)
BUN: 6 mg/dL (ref 6–20)
CO2: 16 mmol/L — ABNORMAL LOW (ref 22–32)
CREATININE: 0.81 mg/dL (ref 0.44–1.00)
Calcium: 8.3 mg/dL — ABNORMAL LOW (ref 8.9–10.3)
Chloride: 110 mmol/L (ref 101–111)
Glucose, Bld: 154 mg/dL — ABNORMAL HIGH (ref 65–99)
POTASSIUM: 3.6 mmol/L (ref 3.5–5.1)
SODIUM: 136 mmol/L (ref 135–145)

## 2016-01-07 LAB — GLUCOSE, CAPILLARY
GLUCOSE-CAPILLARY: 167 mg/dL — AB (ref 65–99)
GLUCOSE-CAPILLARY: 161 mg/dL — AB (ref 65–99)
GLUCOSE-CAPILLARY: 121 mg/dL — AB (ref 65–99)
GLUCOSE-CAPILLARY: 115 mg/dL — AB (ref 65–99)

## 2016-01-07 MED ORDER — SODIUM CHLORIDE 0.9 % IV BOLUS (SEPSIS)
500.0000 mL | Freq: Once | INTRAVENOUS | Status: AC
  Administered 2016-01-07: 500 mL via INTRAVENOUS

## 2016-01-07 MED ORDER — OXYCODONE HCL 5 MG PO TABS
5.0000 mg | ORAL_TABLET | ORAL | Status: DC | PRN
  Administered 2016-01-07 – 2016-01-09 (×7): 5 mg via ORAL
  Filled 2016-01-07 (×7): qty 1

## 2016-01-07 NOTE — Progress Notes (Signed)
Pt being transferred to [place]. Report called to receiving nurse. all questions answered. Pt family notified of transfer. All belongings transferred with patient including cellphone, charger, and home cane.

## 2016-01-07 NOTE — Progress Notes (Signed)
PROGRESS NOTE  Melanie Reynolds  [id number] DOB: [birth year] DOA: 01/04/2016 PCP: Philis Fendt, MD  Outpatient Specialists: [facility], Dr. Corrie Dandy  Brief Narrative: Melanie Reynolds is a 65 y.o. female 22.5 pack-years smoker with recent pneumonia, OSA on CPAP, bipolar disorder and T2DM who presented 9/7 with fever, productive cough, and dyspnea found to have right perihilar/RML infiltrate on CXR consistent with CAP. This is the 2nd episode of pneumonia in the past couple years. Work up at arrival showed fever to 103F, dyspnea without hypoxemia, tachycardia, and leukocytosis. IV rocephin and azithromycin were started, cultures drawn, and weight-based IVF's given. No significant improvement was seen over the first 24 - 48 hours so antibiotics were broadened to include MRSA coverage. Lactate has cleared, PCT 2.21, troponins have been 0.08 and 0.07 due to demand ischemia. WBC has slowly trended downward and vital signs are stable. CT chest was ordered showing no PE and confluence of RUL infiltrate and LUL infiltrate concerning for multifocal pneumonia.   Assessment & Plan: Principal Problem:   CAP (community acquired pneumonia) Active Problems:   Bipolar 1 disorder ([place])   Hypotension   Chronic pain   Diabetes (HCC)   OSA (obstructive sleep apnea)   Tobacco user   Obesity   Sepsis ([place])   AKI (acute kidney injury) ([place])  Sepsis due to community-acquired pneumonia: With colonization or MRSA and reported prior infection, abx were broadened with slow improvement. No respiratory distress. - Transfer to floor given she's not had any respiratory distress and markers are showing improvement. - Blood culture (9/7 - ) NGTD; sputum culture with mixed bacterial flora, reincubated. - Azithromycin and rocephin (9/7 >> 9/8) - Vancomycin and cefepime (9/8 >> ) - No hypoxemia; O2 by [place] prn for SpO2: >90% - Tessalon, robitussin, IS - Duonebs TID, q4h prn - ID input appreciated.   T2DM: HbA1c  6.2%. - Holding victoza - CBGs and SSI  Tobacco use: Ready to quit, brief cessation counseling provided, to follow up with PCP.  - Nicotine patch while inpatient  AKI: Resolved with IVF.  - Continue IVF's along with diet as pt received dye load and has no history of CHF.  Hypokalemia: Mild, acute.  - Replete and recheck in AM  DVT prophylaxis: Lovenox Code Status: Full Family Communication: None at bedside this AM. Declined offer to contact them.  Disposition Plan: Transfer to floor  Consultants:   Infectious disease consulted 9/9  Procedures:   None  Antimicrobials: - Azithromycin, CTX (9/7 >> 9/8) - Vancomycin, cefepime (9/8 >> )  Subjective: "Breathing is better" but still fast with significant cough. Chest pain and stress urinary incontinence with the coughing. CPAP helped but continued to have fevers overnight.   Objective: Vitals:   01/07/16 0600 01/07/16 0603 01/07/16 0700 01/07/16 1155  BP:  133/79 136/76 (!) 149/86  Pulse:  88 (!) 103 (!) 101  Resp: (!) 32 (!) 29 (!) 30 (!) 31  Temp:   (!) 100.6 F (38.1 C) (!) 100.5 F (38.1 C)  TempSrc:   Oral Oral  SpO2:   98% 100%  Weight:      Height:        Intake/Output Summary (Last 24 hours) at 01/07/16 1314 Last data filed at 01/07/16 1038  Gross per 24 hour  Intake             2790 ml  Output              650 ml  Net  2140 ml   Filed Weights   01/04/16 1923 01/05/16 0105  Weight: 100.7 kg (222 lb) 106.2 kg (234 lb 2.1 oz)    Examination: General exam: 65 y.o. female in no distress HEENT: MMM, oropharynx clear Respiratory system: Nonlabored, but tachypneic, breathing room air. Decreased wheezing from prior exams with decreased air movement/crackles/rhonchi in bilateral upper lung zones. No crackles at bases.  Cardiovascular system: Tachycardic but regular. No murmur, rub, or gallop. No JVD, limited due to habitus, and no pedal edema. Gastrointestinal system: Abdomen soft, non-tender,  non-distended, with normoactive bowel sounds. No organomegaly or masses felt. Central nervous system: Alert and oriented. No focal neurological deficits. Extremities: Warm, no deformities. Negative Homan's.  Skin: No rashes, lesions no ulcers Psychiatry: Judgement and insight appear normal. Mood & affect appropriate.   Data Reviewed: I have personally reviewed following labs and imaging studies  CBC:  Recent Labs Lab 01/04/16 1941 01/05/16 0239 01/05/16 0525 01/06/16 0212 01/07/16 0724  WBC 24.7* 12.5* 15.2* 17.7* 12.3*  NEUTROABS 21.1* 10.6*  --   --   --   HGB 14.1 12.7 12.7 12.4 11.7*  HCT 43.1 39.7 40.3 39.1 37.1  MCV 90.4 89.4 89.6 91.4 89.6  PLT 279 250 260 277 [id number]   Basic Metabolic Panel:  Recent Labs Lab 01/04/16 1926 01/05/16 0239 01/05/16 0525 01/06/16 0212 01/07/16 0724  NA 131* 137 139 140 136  K 4.3 3.6 3.3* 4.3 3.6  CL 101 109 112* 112* 110  CO2 19* 19* 19* 18* 16*  GLUCOSE 168* 129* 118* 80 154*  BUN 11 10 10 8 6   CREATININE 1.22* 1.03* 1.00 0.95 0.81  CALCIUM 9.5 8.1* 8.0* 8.1* 8.3*   GFR: Estimated Creatinine Clearance: 82.9 mL/min (by C-G formula based on SCr of 0.81 mg/dL). Liver Function Tests:  Recent Labs Lab 01/04/16 1926 01/05/16 0239 01/05/16 0525  AST 33 25 26  ALT 35 30 29  ALKPHOS 81 59 59  BILITOT 1.8* 1.2 1.6*  PROT 7.9 5.9* 6.1*  ALBUMIN 3.4* 2.5* 2.5*   No results for input(s): LIPASE, AMYLASE in the last 168 hours. No results for input(s): AMMONIA in the last 168 hours. Coagulation Profile:  Recent Labs Lab 01/05/16 0012  INR 1.31   Cardiac Enzymes:  Recent Labs Lab 01/05/16 0012 01/05/16 0525  TROPONINI 0.08* 0.07*   BNP (last 3 results) No results for input(s): PROBNP in the last 8760 hours. HbA1C:  Recent Labs  01/04/16 2333  HGBA1C 6.2*   CBG:  Recent Labs Lab 01/06/16 1328 01/06/16 1641 01/06/16 2122 01/07/16 0730 01/07/16 1158  GLUCAP 135* 139* 150* 167* 161*   Lipid Profile: No  results for input(s): CHOL, HDL, LDLCALC, TRIG, CHOLHDL, LDLDIRECT in the last 72 hours. Thyroid Function Tests: No results for input(s): TSH, T4TOTAL, FREET4, T3FREE, THYROIDAB in the last 72 hours. Anemia Panel: No results for input(s): VITAMINB12, FOLATE, FERRITIN, TIBC, IRON, RETICCTPCT in the last 72 hours. Urine analysis:    Component Value Date/Time   COLORURINE AMBER (A) 01/04/2016 1926   APPEARANCEUR HAZY (A) 01/04/2016 1926   LABSPEC 1.024 01/04/2016 1926   PHURINE 5.5 01/04/2016 1926   GLUCOSEU NEGATIVE 01/04/2016 1926   HGBUR SMALL (A) 01/04/2016 1926   BILIRUBINUR SMALL (A) 01/04/2016 1926   KETONESUR 15 (A) 01/04/2016 1926   PROTEINUR 100 (A) 01/04/2016 1926   UROBILINOGEN 1.0 04/02/2014 1230   NITRITE NEGATIVE 01/04/2016 1926   LEUKOCYTESUR TRACE (A) 01/04/2016 1926   Sepsis Labs: @LABRCNTIP (procalcitonin:4,lacticidven:4)  ) Recent Results (from the  past 240 hour(s))  Urine culture     Status: None   Collection Time: 01/04/16  7:26 PM  Result Value Ref Range Status   Specimen Description URINE, RANDOM  Final   Special Requests NONE  Final   Culture NO GROWTH  Final   Report Status 01/05/2016 FINAL  Final  Culture, blood (Routine X 2)     Status: None (Preliminary result)   Collection Time: 01/04/16  7:35 PM  Result Value Ref Range Status   Specimen Description BLOOD LEFT ANTECUBITAL  Final   Special Requests IN PEDIATRIC BOTTLE 1CC  Final   Culture NO GROWTH 2 DAYS  Final   Report Status PENDING  Incomplete  Culture, blood (Routine X 2)     Status: None (Preliminary result)   Collection Time: 01/04/16  7:56 PM  Result Value Ref Range Status   Specimen Description BLOOD RIGHT ARM  Final   Special Requests BOTTLES DRAWN AEROBIC AND ANAEROBIC 5CC  Final   Culture NO GROWTH 2 DAYS  Final   Report Status PENDING  Incomplete  MRSA PCR Screening     Status: Abnormal   Collection Time: 01/05/16  1:12 AM  Result Value Ref Range Status   MRSA by PCR POSITIVE (A)  NEGATIVE Final    Comment:        The GeneXpert MRSA Assay (FDA approved for NASAL specimens only), is one component of a comprehensive MRSA colonization surveillance program. It is not intended to diagnose MRSA infection nor to guide or monitor treatment for MRSA infections. RESULT CALLED TO, READ BACK BY AND VERIFIED WITH: TOMLINSON,RN @0338  01/05/16 MKELLY   Culture, expectorated sputum-assessment     Status: None   Collection Time: 01/06/16 10:10 AM  Result Value Ref Range Status   Specimen Description SPUTUM  Final   Special Requests NONE  Final   Sputum evaluation   Final    THIS SPECIMEN IS ACCEPTABLE. RESPIRATORY CULTURE REPORT TO FOLLOW.   Report Status 01/06/2016 FINAL  Final  Culture, respiratory (NON-Expectorated)     Status: None (Preliminary result)   Collection Time: 01/06/16 10:10 AM  Result Value Ref Range Status   Specimen Description SPUTUM  Final   Special Requests NONE  Final   Gram Stain   Final    FEW WBC PRESENT, PREDOMINANTLY PMN FEW SQUAMOUS EPITHELIAL CELLS PRESENT FEW GRAM NEGATIVE RODS RARE GRAM POSITIVE COCCI IN PAIRS RARE GRAM VARIABLE ROD    Culture CULTURE REINCUBATED FOR BETTER GROWTH  Final   Report Status PENDING  Incomplete     Radiology Studies: Ct Angio Chest Pe W Or Wo Contrast  Result Date: 01/05/2016 CLINICAL DATA:  Recent car trip from [place]. Dyspnea with chest pain and tachycardia. Evaluate for pulmonary embolus. EXAM: CT ANGIOGRAPHY CHEST WITH CONTRAST TECHNIQUE: Multidetector CT imaging of the chest was performed using the standard protocol during bolus administration of intravenous contrast. Multiplanar CT image reconstructions and MIPs were obtained to evaluate the vascular anatomy. CONTRAST:  100 cc Isovue 370 COMPARISON:  Chest x-ray from yesterday. FINDINGS: Cardiovascular: Heart is borderline enlarged. No pericardial effusion. No thoracic aortic aneurysm. No dissection of the thoracic aorta. Study is degraded by image noise  resulting from breathing motion and body habitus. Within this limitation, there is no large central pulmonary embolus identified. No evidence for filling defect in the lobar or segmental pulmonary arteries to suggest acute pulmonary embolus. Subsegmental pulmonary artery show no definite pulmonary embolic disease butter less reliably evaluated. Mediastinum/Nodes: 10 mm short axis  right paratracheal lymph node is associated with a 10 mm short axis subcarinal lymph node and 11 mm short axis AP window lymph node. Lungs/Pleura: There is dense airspace consolidation in the right upper lobe, clearly progressed since yesterday's x-ray. Compressive atelectasis noted in the dependent lower lobes bilaterally. No features to suggest pulmonary edema. There is some minimal peripheral subpleural airspace disease in the left upper lobe. Upper Abdomen: Unremarkable. Musculoskeletal: Bone windows reveal no worrisome lytic or sclerotic osseous lesions. Review of the MIP images confirms the above findings. IMPRESSION: 1. Limited study without evidence for large central pulmonary embolus in the main pulmonary arteries, lobar pulmonary arteries, or proximal segmental pulmonary arteries. Some segmental and the subsegmental pulmonary arteries show no evidence of embolus but are not reliably evaluated secondary to image noise resulting from body habitus and breathing motion. 2. Interval marked progression since yesterday's chest x-ray of right upper lobe airspace disease which is now confluent. Imaging features are compatible with progressive right upper lobe pneumonia. There is since associated patchy peripheral airspace disease in the left upper lobe and multifocal pneumonia could have this appearance. Follow-up imaging recommended to ensure complete resolution of the right upper lobe disease and exclude underlying central obstructing lesion. 3. Borderline mediastinal lymphadenopathy likely reactive to the right upper lobe disease.  Electronically Signed   By: Misty Stanley M.D.   On: 01/05/2016 19:33    Scheduled Meds: . ceFEPime (MAXIPIME) IV  2 g Intravenous Q8H  . chlorhexidine  15 mL Mouth/Throat BID  . Chlorhexidine Gluconate Cloth  6 each Topical Q0600  . enoxaparin (LOVENOX) injection  40 mg Subcutaneous Q24H  . fluticasone  2 spray Each Nare Daily  . guaiFENesin  600 mg Oral BID  . insulin aspart  0-20 Units Subcutaneous TID WC  . insulin aspart  0-5 Units Subcutaneous QHS  . mupirocin ointment  1 application Nasal BID  . nicotine  14 mg Transdermal Daily  . pregabalin  75 mg Oral TID  . simvastatin  10 mg Oral Daily  . topiramate  25 mg Oral Daily  . traZODone  50 mg Oral QHS  . vancomycin  750 mg Intravenous Q12H   Continuous Infusions: . sodium chloride 100 mL/hr at 01/07/16 0542     LOS: 3 days   Time spent: 25 minutes.  Vance Gather, MD [facility] Pager [phone]  If 7PM-7AM, please contact night-coverage [email or web address] Password [email or web address] 01/07/2016, 1:14 PM

## 2016-01-07 NOTE — Progress Notes (Addendum)
Patient arrived on unit via bed from [facility].  No family at bedside. Telemetry placed per MD order and CMT notified.  Incentive Spirometry given to patient and instructed on use.

## 2016-01-07 NOTE — Plan of Care (Signed)
Problem: Education: Goal: Knowledge of  General Education information/materials will improve Outcome: Progressing Discussed plan of care and temperature regulation with patient with some teach back displayed; see MAR

## 2016-01-07 NOTE — Consult Note (Signed)
[facility] for Infectious Disease    Date of Admission:  01/04/2016    Total days of antibiotics 4        Day 3 vancomycin        Day 3 cefepime        Principal Problem:   CAP (community acquired pneumonia) Active Problems:   Sepsis ([place])   Bipolar 1 disorder ([place])   Hypotension   Chronic pain   Diabetes ([place])   OSA (obstructive sleep apnea)   Tobacco user   Obesity   AKI (acute kidney injury) ([place])   . ceFEPime (MAXIPIME) IV  2 g Intravenous Q8H  . chlorhexidine  15 mL Mouth/Throat BID  . Chlorhexidine Gluconate Cloth  6 each Topical Q0600  . enoxaparin (LOVENOX) injection  40 mg Subcutaneous Q24H  . fluticasone  2 spray Each Nare Daily  . guaiFENesin  600 mg Oral BID  . insulin aspart  0-20 Units Subcutaneous TID WC  . insulin aspart  0-5 Units Subcutaneous QHS  . mupirocin ointment  1 application Nasal BID  . nicotine  14 mg Transdermal Daily  . pneumococcal 23 valent vaccine  0.5 mL Intramuscular Tomorrow-1000  . pregabalin  75 mg Oral TID  . simvastatin  10 mg Oral Daily  . topiramate  25 mg Oral Daily  . traZODone  50 mg Oral QHS  . vancomycin  750 mg Intravenous Q12H    SUBJECTIVE: She is still coughing and bringing up blood flecked yellow sputum but she is feeling better.  Review of Systems: Review of Systems  Constitutional: Positive for fever and malaise/fatigue. Negative for chills and diaphoresis.  Respiratory: Positive for cough, sputum production and shortness of breath.   Cardiovascular: Positive for chest pain.    Past Medical History:  Diagnosis Date  . Anxiety   . Arthritis    "knees, legs, back" (06/01/2013)  . Bipolar depression ([place])   . Chronic lower back pain   . High cholesterol   . OSA (obstructive sleep apnea)   . Pneumonia 1970's; 1980's; 06/01/2013   "total of 3 times"  . Type II diabetes mellitus ([place])     Social History  Substance Use Topics  . Smoking status: Current Every Day Smoker    Packs/day: 0.50      Years: 45.00    Types: Cigarettes  . Smokeless tobacco: Never Used     Comment: h/o 1 PPD  . Alcohol use Yes     Comment: 06/01/2013 "glass of wine maybe q 6 months, if that"    Family History  Problem Relation Age of Onset  . Kidney failure Mother    Allergies  Allergen Reactions  . Latex Itching  . Naproxen Other (See Comments)    Abdominal pain    OBJECTIVE: Vitals:   01/07/16 0600 01/07/16 0603 01/07/16 0700 01/07/16 1155  BP:  133/79 136/76 (!) 149/86  Pulse:  88 (!) 103 (!) 101  Resp: (!) 32 (!) 29 (!) 30 (!) 31  Temp:   (!) 100.6 F (38.1 C) (!) 100.5 F (38.1 C)  TempSrc:   Oral Oral  SpO2:   98% 100%  Weight:      Height:       Body mass index is 41.47 kg/m.  Physical Exam  Constitutional: She is oriented to person, place, and time.  She is alert and in no distress.  Cardiovascular: Normal rate and regular rhythm.   No murmur  heard. Very distant heart sounds.  Pulmonary/Chest: Effort normal.  Every time she takes a deep breath she coughs making examination of her lungs difficult.  Neurological: She is alert and oriented to person, place, and time.  Psychiatric: Mood and affect normal.    Lab Results Lab Results  Component Value Date   WBC 12.3 (H) 01/07/2016   HGB 11.7 (L) 01/07/2016   HCT 37.1 01/07/2016   MCV 89.6 01/07/2016   PLT 272 01/07/2016    Lab Results  Component Value Date   CREATININE 0.81 01/07/2016   BUN 6 01/07/2016   NA 136 01/07/2016   K 3.6 01/07/2016   CL 110 01/07/2016   CO2 16 (L) 01/07/2016    Lab Results  Component Value Date   ALT 29 01/05/2016   AST 26 01/05/2016   ALKPHOS 59 01/05/2016   BILITOT 1.6 (H) 01/05/2016     Microbiology: Recent Results (from the past 240 hour(s))  Urine culture     Status: None   Collection Time: 01/04/16  7:26 PM  Result Value Ref Range Status   Specimen Description URINE, RANDOM  Final   Special Requests NONE  Final   Culture NO GROWTH  Final   Report Status 01/05/2016  FINAL  Final  Culture, blood (Routine X 2)     Status: None (Preliminary result)   Collection Time: 01/04/16  7:35 PM  Result Value Ref Range Status   Specimen Description BLOOD LEFT ANTECUBITAL  Final   Special Requests IN PEDIATRIC BOTTLE 1CC  Final   Culture NO GROWTH 2 DAYS  Final   Report Status PENDING  Incomplete  Culture, blood (Routine X 2)     Status: None (Preliminary result)   Collection Time: 01/04/16  7:56 PM  Result Value Ref Range Status   Specimen Description BLOOD RIGHT ARM  Final   Special Requests BOTTLES DRAWN AEROBIC AND ANAEROBIC 5CC  Final   Culture NO GROWTH 2 DAYS  Final   Report Status PENDING  Incomplete  MRSA PCR Screening     Status: Abnormal   Collection Time: 01/05/16  1:12 AM  Result Value Ref Range Status   MRSA by PCR POSITIVE (A) NEGATIVE Final    Comment:        The GeneXpert MRSA Assay (FDA approved for NASAL specimens only), is one component of a comprehensive MRSA colonization surveillance program. It is not intended to diagnose MRSA infection nor to guide or monitor treatment for MRSA infections. RESULT CALLED TO, READ BACK BY AND VERIFIED WITH: TOMLINSON,RN @0338  01/05/16 MKELLY   Culture, expectorated sputum-assessment     Status: None   Collection Time: 01/06/16 10:10 AM  Result Value Ref Range Status   Specimen Description SPUTUM  Final   Special Requests NONE  Final   Sputum evaluation   Final    THIS SPECIMEN IS ACCEPTABLE. RESPIRATORY CULTURE REPORT TO FOLLOW.   Report Status 01/06/2016 FINAL  Final  Culture, respiratory (NON-Expectorated)     Status: None (Preliminary result)   Collection Time: 01/06/16 10:10 AM  Result Value Ref Range Status   Specimen Description SPUTUM  Final   Special Requests NONE  Final   Gram Stain   Final    FEW WBC PRESENT, PREDOMINANTLY PMN FEW SQUAMOUS EPITHELIAL CELLS PRESENT FEW GRAM NEGATIVE RODS RARE GRAM POSITIVE COCCI IN PAIRS RARE GRAM VARIABLE ROD    Culture CULTURE REINCUBATED FOR  BETTER GROWTH  Final   Report Status PENDING  Incomplete  ASSESSMENT: Her sputum Gram stain shows mixed bacterial flora. Her cultures have been reintubated. Blood cultures are negative. She is feeling a little bit better on empiric anabiotic therapy.  PLAN: 1. Continue vancomycin and cefepime pending final culture results  Michel Bickers, MD [facility] for [place] [phone] pager   [phone] cell 01/07/2016, 12:23 PM

## 2016-01-07 NOTE — Progress Notes (Signed)
Pt. placed on CPAP for h/s, on room air, humidity filled, tolerating well.

## 2016-01-08 ENCOUNTER — Inpatient Hospital Stay (HOSPITAL_COMMUNITY): Payer: Medicare Other

## 2016-01-08 LAB — GLUCOSE, CAPILLARY
GLUCOSE-CAPILLARY: 117 mg/dL — AB (ref 65–99)
Glucose-Capillary: 137 mg/dL — ABNORMAL HIGH (ref 65–99)
Glucose-Capillary: 136 mg/dL — ABNORMAL HIGH (ref 65–99)
Glucose-Capillary: 139 mg/dL — ABNORMAL HIGH (ref 65–99)

## 2016-01-08 LAB — CULTURE, RESPIRATORY W GRAM STAIN

## 2016-01-08 LAB — CULTURE, RESPIRATORY: CULTURE: NORMAL

## 2016-01-08 LAB — LEGIONELLA PNEUMOPHILA SEROGP 1 UR AG: L. pneumophila Serogp 1 Ur Ag: POSITIVE — AB

## 2016-01-08 MED ORDER — GADOBENATE DIMEGLUMINE 529 MG/ML IV SOLN
20.0000 mL | Freq: Once | INTRAVENOUS | Status: AC | PRN
  Administered 2016-01-08: 20 mL via INTRAVENOUS

## 2016-01-08 MED ORDER — LEVOFLOXACIN IN D5W 750 MG/150ML IV SOLN
750.0000 mg | INTRAVENOUS | Status: DC
  Administered 2016-01-08: 750 mg via INTRAVENOUS
  Filled 2016-01-08: qty 150

## 2016-01-08 NOTE — Care Management Important Message (Signed)
Important Message  Patient Details  Name: Melanie Reynolds MRN: [id number] Date of Birth: 1950/06/16   Medicare Important Message Given:  Yes    [place] Kreitzer, Rory Percy, RN 01/08/2016, 12:16 PM

## 2016-01-08 NOTE — Progress Notes (Addendum)
INFECTIOUS DISEASE PROGRESS NOTE  ID: Melanie Reynolds is a 65 y.o. female with  Principal Problem:   CAP (community acquired pneumonia) Active Problems:   Bipolar 1 disorder ([place])   Hypotension   Chronic pain   Diabetes ([place])   OSA (obstructive sleep apnea)   Tobacco user   Obesity   Sepsis ([place])   AKI (acute kidney injury) ([place])  Subjective: Feels better C/o headaches  Abtx:  Anti-infectives    Start     Dose/Rate Route Frequency Ordered Stop   01/06/16 0800  vancomycin (VANCOCIN) IVPB 750 mg/150 ml premix     750 mg 150 mL/hr over 60 Minutes Intravenous Every 12 hours 01/05/16 1902     01/06/16 0430  vancomycin (VANCOCIN) IVPB 750 mg/150 ml premix  Status:  Discontinued     750 mg 150 mL/hr over 60 Minutes Intravenous Every 12 hours 01/05/16 1627 01/05/16 1902   01/05/16 2000  ceFEPIme (MAXIPIME) 2 g in dextrose 5 % 50 mL IVPB     2 g 100 mL/hr over 30 Minutes Intravenous Every 8 hours 01/05/16 1902     01/05/16 2000  vancomycin (VANCOCIN) 2,000 mg in sodium chloride 0.9 % 500 mL IVPB     2,000 mg 250 mL/hr over 120 Minutes Intravenous  Once 01/05/16 1902 01/05/16 2150   01/05/16 1630  vancomycin (VANCOCIN) 2,000 mg in sodium chloride 0.9 % 500 mL IVPB  Status:  Discontinued     2,000 mg 250 mL/hr over 120 Minutes Intravenous  Once 01/05/16 1627 01/05/16 2150   01/05/16 1630  ceFEPIme (MAXIPIME) 2 g in dextrose 5 % 50 mL IVPB  Status:  Discontinued     2 g 100 mL/hr over 30 Minutes Intravenous Every 8 hours 01/05/16 1627 01/05/16 1902   01/04/16 2345  cefTRIAXone (ROCEPHIN) 1 g in dextrose 5 % 50 mL IVPB  Status:  Discontinued     1 g 100 mL/hr over 30 Minutes Intravenous Every 24 hours 01/04/16 2332 01/05/16 1614   01/04/16 2345  azithromycin (ZITHROMAX) 500 mg in dextrose 5 % 250 mL IVPB  Status:  Discontinued     500 mg 250 mL/hr over 60 Minutes Intravenous Every 24 hours 01/04/16 2332 01/05/16 1625   01/04/16 2100  cefTRIAXone (ROCEPHIN) 1 g in dextrose 5 % 50 mL  IVPB     1 g 100 mL/hr over 30 Minutes Intravenous  Once 01/04/16 2057 01/04/16 2153   01/04/16 2100  azithromycin (ZITHROMAX) 500 mg in dextrose 5 % 250 mL IVPB     500 mg 250 mL/hr over 60 Minutes Intravenous  Once 01/04/16 2057 01/04/16 2301      Medications:  Scheduled: . ceFEPime (MAXIPIME) IV  2 g Intravenous Q8H  . chlorhexidine  15 mL Mouth/Throat BID  . Chlorhexidine Gluconate Cloth  6 each Topical Q0600  . enoxaparin (LOVENOX) injection  40 mg Subcutaneous Q24H  . fluticasone  2 spray Each Nare Daily  . guaiFENesin  600 mg Oral BID  . insulin aspart  0-20 Units Subcutaneous TID WC  . insulin aspart  0-5 Units Subcutaneous QHS  . mupirocin ointment  1 application Nasal BID  . nicotine  14 mg Transdermal Daily  . pregabalin  75 mg Oral TID  . simvastatin  10 mg Oral Daily  . topiramate  25 mg Oral Daily  . traZODone  50 mg Oral QHS  . vancomycin  750 mg Intravenous Q12H    Objective: Vital signs in last 24 hours: Temp:  [  98.5 F (36.9 C)-101.3 F (38.5 C)] 99.8 F (37.7 C) (09/11 1043) Pulse Rate:  [94-101] 94 (09/11 1043) Resp:  [18-20] 18 (09/11 1043) BP: (120-144)/(45-79) 120/45 (09/11 1043) SpO2:  [96 %-100 %] 96 % (09/11 1043) Weight:  [107.1 kg (236 lb 3.2 oz)] 107.1 kg (236 lb 3.2 oz) (09/10 2059)   General appearance: alert, cooperative and no distress Resp: diminished breath sounds bilaterally Cardio: regular rate and rhythm GI: normal findings: bowel sounds normal and soft, non-tender  Lab Results  Recent Labs  01/06/16 0212 01/07/16 0724  WBC 17.7* 12.3*  HGB 12.4 11.7*  HCT 39.1 37.1  NA 140 136  K 4.3 3.6  CL 112* 110  CO2 18* 16*  BUN 8 6  CREATININE 0.95 0.81   Liver Panel No results for input(s): PROT, ALBUMIN, AST, ALT, ALKPHOS, BILITOT, BILIDIR, IBILI in the last 72 hours. Sedimentation Rate No results for input(s): ESRSEDRATE in the last 72 hours. C-Reactive Protein No results for input(s): CRP in the last 72  hours.  Microbiology: Recent Results (from the past 240 hour(s))  Urine culture     Status: None   Collection Time: 01/04/16  7:26 PM  Result Value Ref Range Status   Specimen Description URINE, RANDOM  Final   Special Requests NONE  Final   Culture NO GROWTH  Final   Report Status 01/05/2016 FINAL  Final  Culture, blood (Routine X 2)     Status: None (Preliminary result)   Collection Time: 01/04/16  7:35 PM  Result Value Ref Range Status   Specimen Description BLOOD LEFT ANTECUBITAL  Final   Special Requests IN PEDIATRIC BOTTLE 1CC  Final   Culture NO GROWTH 3 DAYS  Final   Report Status PENDING  Incomplete  Culture, blood (Routine X 2)     Status: None (Preliminary result)   Collection Time: 01/04/16  7:56 PM  Result Value Ref Range Status   Specimen Description BLOOD RIGHT ARM  Final   Special Requests BOTTLES DRAWN AEROBIC AND ANAEROBIC 5CC  Final   Culture NO GROWTH 3 DAYS  Final   Report Status PENDING  Incomplete  MRSA PCR Screening     Status: Abnormal   Collection Time: 01/05/16  1:12 AM  Result Value Ref Range Status   MRSA by PCR POSITIVE (A) NEGATIVE Final    Comment:        The GeneXpert MRSA Assay (FDA approved for NASAL specimens only), is one component of a comprehensive MRSA colonization surveillance program. It is not intended to diagnose MRSA infection nor to guide or monitor treatment for MRSA infections. RESULT CALLED TO, READ BACK BY AND VERIFIED WITH: TOMLINSON,RN @0338  01/05/16 MKELLY   Culture, expectorated sputum-assessment     Status: None   Collection Time: 01/06/16 10:10 AM  Result Value Ref Range Status   Specimen Description SPUTUM  Final   Special Requests NONE  Final   Sputum evaluation   Final    THIS SPECIMEN IS ACCEPTABLE. RESPIRATORY CULTURE REPORT TO FOLLOW.   Report Status 01/06/2016 FINAL  Final  Culture, respiratory (NON-Expectorated)     Status: None   Collection Time: 01/06/16 10:10 AM  Result Value Ref Range Status    Specimen Description SPUTUM  Final   Special Requests NONE  Final   Gram Stain   Final    FEW WBC PRESENT, PREDOMINANTLY PMN FEW SQUAMOUS EPITHELIAL CELLS PRESENT FEW GRAM NEGATIVE RODS RARE GRAM POSITIVE COCCI IN PAIRS RARE GRAM VARIABLE ROD  Culture Consistent with normal respiratory flora.  Final   Report Status 01/08/2016 FINAL  Final    Studies/Results: No results found.   Assessment/Plan: CAP, severe  Resp Cx is normal flora BCx are normal.  WBC better, temp 101 overnight but down this AM Consider stopping vanco in Am as Cx did not show MRSA (PCR was positive however).  If she does well after that, consider change to po.  Total days of antibiotics: 5 (vanco/cefepime)         Bobby Rumpf Infectious Diseases (pager) [phone] [email or web address] 01/08/2016, 12:26 PM  LOS: 4 days    Legionella U Ag+, will add levaquin.  My great appreciation to pharmacy.

## 2016-01-08 NOTE — Progress Notes (Signed)
PROGRESS NOTE  Melanie Reynolds  [id number] DOB: October 19, 1950 DOA: 01/04/2016 PCP: Philis Fendt, MD  Outpatient Specialists: [facility], Dr. Corrie Dandy  Brief Narrative: Melanie Reynolds is a 65 y.o. female 22.5 pack-years smoker with recent pneumonia, OSA on CPAP, bipolar disorder and T2DM who presented 9/7 with fever, productive cough, and dyspnea found to have right perihilar/RML infiltrate on CXR consistent with CAP. This is the 2nd episode of pneumonia in the past couple years. Work up at arrival showed fever to 103F, dyspnea without hypoxemia, tachycardia, and leukocytosis. IV rocephin and azithromycin were started, cultures drawn, and weight-based IVF's given. No significant improvement was seen over the first 24 - 48 hours so antibiotics were broadened to include MRSA coverage. Lactate has cleared, PCT 2.21, troponins have been 0.08 and 0.07 due to demand ischemia. WBC has slowly trended downward and vital signs are stable. CT chest was ordered showing no PE and confluence of RUL infiltrate and LUL infiltrate concerning for multifocal pneumonia. Transferred to floor 9/10 with ongoing slow improvement. Still with a headache that she was evaluated for in July.   Assessment & Plan: Principal Problem:   CAP (community acquired pneumonia) Active Problems:   Bipolar 1 disorder ([place])   Hypotension   Chronic pain   Diabetes ([facility])   OSA (obstructive sleep apnea)   Tobacco user   Obesity   Sepsis ([place])   AKI (acute kidney injury) ([place])  Sepsis due to community-acquired pneumonia: With colonization or MRSA and reported prior infection, abx were broadened with slow improvement. No respiratory distress. - Continue IV abx on floor. Consider transition to po once afebrile x 24 hours.  - Blood culture (9/7 - ) NGTD; sputum culture with mixed bacterial flora, final no growth.  - Azithromycin and rocephin (9/7 >> 9/8) - Vancomycin and cefepime (9/8 >> ) - No hypoxemia; O2 by [place] prn for SpO2:  >90% - Tessalon, robitussin, IS - Duonebs TID, q4h prn - ID input appreciated.  - Check labs tomorrow.   Headache: Without focal deficits, photophobia or nuchal rigidity. ?due to infection. Without meningitic signs and failure to improve on what would be appropriate abx, doubt meningitis. 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  - Consider MRI/MRA head to evaluate CTA findings. Could also consider CT venogram to r/o venous sinus thrombosis.   T2DM: HbA1c 6.2%. - Holding victoza - CBGs and SSI  Tobacco use: Ready to quit, brief cessation counseling provided, to follow up with PCP.  - Nicotine patch while inpatient  AKI: Resolved with IVF.  - Continue IVF's along with diet as pt received dye load and has no history of CHF.  Hypokalemia: Mild, acute.  - Replete and recheck in AM  DVT prophylaxis: Lovenox Code Status: Full Family Communication: None at bedside this AM. Again declined offer to contact them.  Disposition Plan: Continue IV abx caring for severe pneumonia on floor, discharge once consistently afebrile with stable vital signs.   Consultants:   Infectious disease consulted 9/9  Procedures:   None  Antimicrobials: - Azithromycin, CTX (9/7 >> 9/8) - Vancomycin, cefepime (9/8 >> )  Subjective: "Breathing is better" but feels terrible due to severe global headache. No trouble speaking or weakness/numbness.   Objective: Vitals:   01/07/16 2220 01/07/16 2346 01/08/16 0530 01/08/16 1043  BP:   127/71 (!) 120/45  Pulse:  95 (!) 101  94  Resp:  18 19 18   Temp: 98.8 F (37.1 C)  100.2 F (37.9 C) 99.8 F (37.7 C)  TempSrc: Oral  Oral Oral  SpO2:  96% 99% 96%  Weight:      Height:        Intake/Output Summary (Last 24 hours) at 01/08/16 1115 Last data filed at 01/08/16 0930  Gross per 24 hour  Intake              3170 ml  Output             3200 ml  Net              -30 ml   Filed Weights   01/04/16 1923 01/05/16 0105 01/07/16 2059  Weight: 100.7 kg (222 lb) 106.2 kg (234 lb 2.1 oz) 107.1 kg (236 lb 3.2 oz)    Examination: General exam: 65 y.o. female in no distress HEENT: MMM, oropharynx clear Respiratory system: Nonlabored, but tachypneic, breathing room air. Decreased wheezing from prior exams with decreased air movement/crackles/rhonchi in bilateral upper lung zones. No crackles at bases.  Cardiovascular system: Tachycardic but regular. No murmur, rub, or gallop. No JVD, limited due to habitus, and no pedal edema. Gastrointestinal system: Abdomen soft, non-tender, non-distended, with normoactive bowel sounds. No organomegaly or masses felt. Central nervous system: Alert and oriented. No focal neurological deficits. Speech normal.  Extremities: Warm, no deformities. Negative Homan's.  Skin: No rashes, lesions no ulcers Psychiatry: Judgement and insight appear normal. Mood & affect appropriate.   Data Reviewed: I have personally reviewed following labs and imaging studies  CBC:  Recent Labs Lab 01/04/16 1941 01/05/16 0239 01/05/16 0525 01/06/16 0212 01/07/16 0724  WBC 24.7* 12.5* 15.2* 17.7* 12.3*  NEUTROABS 21.1* 10.6*  --   --   --   HGB 14.1 12.7 12.7 12.4 11.7*  HCT 43.1 39.7 40.3 39.1 37.1  MCV 90.4 89.4 89.6 91.4 89.6  PLT 279 250 260 277 [id number]   Basic Metabolic Panel:  Recent Labs Lab 01/04/16 1926 01/05/16 0239 01/05/16 0525 01/06/16 0212 01/07/16 0724  NA 131* 137 139 140 136  K 4.3 3.6 3.3* 4.3 3.6  CL 101 109 112* 112* 110  CO2 19* 19* 19* 18* 16*  GLUCOSE 168* 129* 118* 80 154*  BUN 11 10 10 8 6   CREATININE 1.22* 1.03* 1.00 0.95 0.81  CALCIUM 9.5 8.1* 8.0* 8.1* 8.3*   GFR: Estimated Creatinine Clearance: 83.4 mL/min (by C-G formula based on SCr of 0.81 mg/dL). Liver Function Tests:  Recent Labs Lab 01/04/16 1926 01/05/16 0239 01/05/16 0525  AST 33  25 26  ALT 35 30 29  ALKPHOS 81 59 59  BILITOT 1.8* 1.2 1.6*  PROT 7.9 5.9* 6.1*  ALBUMIN 3.4* 2.5* 2.5*   No results for input(s): LIPASE, AMYLASE in the last 168 hours. No results for input(s): AMMONIA in the last 168 hours. Coagulation Profile:  Recent Labs Lab 01/05/16 0012  INR 1.31   Cardiac Enzymes:  Recent Labs Lab 01/05/16 0012 01/05/16 0525  TROPONINI 0.08* 0.07*   BNP (last 3 results) No results for input(s): PROBNP in the last 8760 hours. HbA1C: No results for input(s): HGBA1C in the last 72 hours. CBG:  Recent Labs Lab 01/07/16 0730 01/07/16 1158 01/07/16 1649 01/07/16 2059 01/08/16 0757  GLUCAP 167* 161* 121* 115* 137*   Lipid Profile: No results for input(s): CHOL, HDL, LDLCALC, TRIG, CHOLHDL, LDLDIRECT in the last 72 hours. Thyroid Function Tests: No results for input(s):  TSH, T4TOTAL, FREET4, T3FREE, THYROIDAB in the last 72 hours. Anemia Panel: No results for input(s): VITAMINB12, FOLATE, FERRITIN, TIBC, IRON, RETICCTPCT in the last 72 hours. Urine analysis:    Component Value Date/Time   COLORURINE AMBER (A) 01/04/2016 1926   APPEARANCEUR HAZY (A) 01/04/2016 1926   LABSPEC 1.024 01/04/2016 1926   PHURINE 5.5 01/04/2016 1926   GLUCOSEU NEGATIVE 01/04/2016 1926   HGBUR SMALL (A) 01/04/2016 1926   BILIRUBINUR SMALL (A) 01/04/2016 1926   KETONESUR 15 (A) 01/04/2016 1926   PROTEINUR 100 (A) 01/04/2016 1926   UROBILINOGEN 1.0 04/02/2014 1230   NITRITE NEGATIVE 01/04/2016 1926   LEUKOCYTESUR TRACE (A) 01/04/2016 1926   Sepsis Labs: @LABRCNTIP (procalcitonin:4,lacticidven:4)  ) Recent Results (from the past 240 hour(s))  Urine culture     Status: None   Collection Time: 01/04/16  7:26 PM  Result Value Ref Range Status   Specimen Description URINE, RANDOM  Final   Special Requests NONE  Final   Culture NO GROWTH  Final   Report Status 01/05/2016 FINAL  Final  Culture, blood (Routine X 2)     Status: None (Preliminary result)    Collection Time: 01/04/16  7:35 PM  Result Value Ref Range Status   Specimen Description BLOOD LEFT ANTECUBITAL  Final   Special Requests IN PEDIATRIC BOTTLE 1CC  Final   Culture NO GROWTH 3 DAYS  Final   Report Status PENDING  Incomplete  Culture, blood (Routine X 2)     Status: None (Preliminary result)   Collection Time: 01/04/16  7:56 PM  Result Value Ref Range Status   Specimen Description BLOOD RIGHT ARM  Final   Special Requests BOTTLES DRAWN AEROBIC AND ANAEROBIC 5CC  Final   Culture NO GROWTH 3 DAYS  Final   Report Status PENDING  Incomplete  MRSA PCR Screening     Status: Abnormal   Collection Time: 01/05/16  1:12 AM  Result Value Ref Range Status   MRSA by PCR POSITIVE (A) NEGATIVE Final    Comment:        The GeneXpert MRSA Assay (FDA approved for NASAL specimens only), is one component of a comprehensive MRSA colonization surveillance program. It is not intended to diagnose MRSA infection nor to guide or monitor treatment for MRSA infections. RESULT CALLED TO, READ BACK BY AND VERIFIED WITH: TOMLINSON,RN @0338  01/05/16 MKELLY   Culture, expectorated sputum-assessment     Status: None   Collection Time: 01/06/16 10:10 AM  Result Value Ref Range Status   Specimen Description SPUTUM  Final   Special Requests NONE  Final   Sputum evaluation   Final    THIS SPECIMEN IS ACCEPTABLE. RESPIRATORY CULTURE REPORT TO FOLLOW.   Report Status 01/06/2016 FINAL  Final  Culture, respiratory (NON-Expectorated)     Status: None   Collection Time: 01/06/16 10:10 AM  Result Value Ref Range Status   Specimen Description SPUTUM  Final   Special Requests NONE  Final   Gram Stain   Final    FEW WBC PRESENT, PREDOMINANTLY PMN FEW SQUAMOUS EPITHELIAL CELLS PRESENT FEW GRAM NEGATIVE RODS RARE GRAM POSITIVE COCCI IN PAIRS RARE GRAM VARIABLE ROD    Culture Consistent with normal respiratory flora.  Final   Report Status 01/08/2016 FINAL  Final     Radiology Studies: No results  found.  Scheduled Meds: . ceFEPime (MAXIPIME) IV  2 g Intravenous Q8H  . chlorhexidine  15 mL Mouth/Throat BID  . Chlorhexidine Gluconate Cloth  6 each Topical Q0600  .  enoxaparin (LOVENOX) injection  40 mg Subcutaneous Q24H  . fluticasone  2 spray Each Nare Daily  . guaiFENesin  600 mg Oral BID  . insulin aspart  0-20 Units Subcutaneous TID WC  . insulin aspart  0-5 Units Subcutaneous QHS  . mupirocin ointment  1 application Nasal BID  . nicotine  14 mg Transdermal Daily  . pregabalin  75 mg Oral TID  . simvastatin  10 mg Oral Daily  . topiramate  25 mg Oral Daily  . traZODone  50 mg Oral QHS  . vancomycin  750 mg Intravenous Q12H   Continuous Infusions: . sodium chloride 100 mL/hr at 01/08/16 1015     LOS: 4 days   Time spent: 25 minutes.  Vance Gather, MD [facility] Pager [phone]  If 7PM-7AM, please contact night-coverage [email or web address] Password [email or web address] 01/08/2016, 11:15 AM

## 2016-01-08 NOTE — Progress Notes (Signed)
Pharmacy Antibiotic Note  Melanie Reynolds is a 65 y.o. female admitted on 01/04/2016 with pneumonia.  Pharmacy has been consulted for vancomycin/cefepime dosing.    Plan: Cefepime 2g IV q8h Vancomycin 750mg  IV q12h (noted plans by ID to stop in the morning) Monitor clinical progress, c/s, renal function, abx plan   Height: 5\' 3"  (160 cm) Weight: 236 lb 3.2 oz (107.1 kg) IBW/kg (Calculated) : 52.4  Temp (24hrs), Avg:99.7 F (37.6 C), Min:98.5 F (36.9 C), Max:101.3 F (38.5 C)   Recent Labs Lab 01/04/16 1926 01/04/16 1941 01/04/16 2015 01/04/16 2217 01/05/16 0239 01/05/16 0525 01/06/16 0212 01/07/16 0724  WBC  --  24.7*  --   --  12.5* 15.2* 17.7* 12.3*  CREATININE 1.22*  --   --   --  1.03* 1.00 0.95 0.81  LATICACIDVEN  --   --  3.05* 1.76  --   --   --   --     Estimated Creatinine Clearance: 83.4 mL/min (by C-G formula based on SCr of 0.81 mg/dL).    Allergies  Allergen Reactions  . Latex Itching  . Naproxen Other (See Comments)    Abdominal pain    Antimicrobials this admission: Ceftriaxone 9/7 >> 9/8 Azithromycin 9/7 >> 9/8 Vancomycin 9/8 >> Cefepime 9/8 >>  Dose adjustments this admission:   Microbiology results: 9/7 BCx - NGTD 9/7 UCx - negative 9/7 MRSA PCR - positive 9/9 sputum - nml flora Legionella/Strep Ag - negative HIV - negative  Jeilani Grupe D. Dawnisha Marquina, PharmD, BCPS Clinical Pharmacist Pager: [phone] 01/08/2016 2:25 PM

## 2016-01-09 DIAGNOSIS — A481 Legionnaires' disease: Secondary | ICD-10-CM | POA: Diagnosis present

## 2016-01-09 LAB — BASIC METABOLIC PANEL WITH GFR
Anion gap: 8 (ref 5–15)
BUN: 6 mg/dL (ref 6–20)
CO2: 21 mmol/L — ABNORMAL LOW (ref 22–32)
Calcium: 8.5 mg/dL — ABNORMAL LOW (ref 8.9–10.3)
Chloride: 111 mmol/L (ref 101–111)
Creatinine, Ser: 0.65 mg/dL (ref 0.44–1.00)
GFR calc Af Amer: >60 mL/min
GFR calc non Af Amer: >60 mL/min
Glucose, Bld: 141 mg/dL — ABNORMAL HIGH (ref 65–99)
Potassium: 3.3 mmol/L — ABNORMAL LOW (ref 3.5–5.1)
Sodium: 140 mmol/L (ref 135–145)

## 2016-01-09 LAB — GLUCOSE, CAPILLARY
GLUCOSE-CAPILLARY: 113 mg/dL — AB (ref 65–99)
GLUCOSE-CAPILLARY: 128 mg/dL — AB (ref 65–99)
Glucose-Capillary: 142 mg/dL — ABNORMAL HIGH (ref 65–99)

## 2016-01-09 LAB — CBC
HCT: 34.2 % — ABNORMAL LOW (ref 36.0–46.0)
HEMOGLOBIN: 11 g/dL — AB (ref 12.0–15.0)
MCH: 28.6 pg (ref 26.0–34.0)
MCHC: 32.2 g/dL (ref 30.0–36.0)
MCV: 88.8 fL (ref 78.0–100.0)
PLATELETS: 305 10*3/uL (ref 150–400)
RBC: 3.85 MIL/uL — ABNORMAL LOW (ref 3.87–5.11)
RDW: 14.6 % (ref 11.5–15.5)
WBC: 9.1 10*3/uL (ref 4.0–10.5)

## 2016-01-09 LAB — CULTURE, BLOOD (ROUTINE X 2)
CULTURE: NO GROWTH
Culture: NO GROWTH

## 2016-01-09 MED ORDER — LEVOFLOXACIN 750 MG PO TABS
750.0000 mg | ORAL_TABLET | Freq: Every day | ORAL | Status: DC
  Administered 2016-01-09: 750 mg via ORAL
  Filled 2016-01-09: qty 1

## 2016-01-09 MED ORDER — SENNOSIDES-DOCUSATE SODIUM 8.6-50 MG PO TABS
1.0000 | ORAL_TABLET | Freq: Two times a day (BID) | ORAL | Status: DC
  Administered 2016-01-09: 1 via ORAL
  Filled 2016-01-09: qty 1

## 2016-01-09 MED ORDER — SENNOSIDES-DOCUSATE SODIUM 8.6-50 MG PO TABS: 1.0000 | ORAL_TABLET | Freq: Two times a day (BID) | ORAL | Status: DC | PRN

## 2016-01-09 MED ORDER — LEVOFLOXACIN 750 MG PO TABS: 750.0000 mg | ORAL_TABLET | Freq: Every day | ORAL | 0 refills | Status: DC

## 2016-01-09 MED ORDER — POTASSIUM CHLORIDE CRYS ER 20 MEQ PO TBCR
20.0000 meq | EXTENDED_RELEASE_TABLET | Freq: Once | ORAL | Status: AC
  Administered 2016-01-09: 20 meq via ORAL
  Filled 2016-01-09: qty 1

## 2016-01-09 NOTE — Progress Notes (Signed)
Patient complain of constipation.  MD notified.

## 2016-01-09 NOTE — Progress Notes (Signed)
Melanie Reynolds to be D/C'd Home per MD order.  Discussed prescriptions and follow up appointments with the patient. Prescriptions given to patient, medication list explained in detail. Pt verbalized understanding.    Medication List    TAKE these medications   albuterol 108 (90 Base) MCG/ACT inhaler Commonly known as:  PROVENTIL HFA;VENTOLIN HFA Inhale 2 puffs into the lungs every 6 (six) hours as needed for wheezing or shortness of breath.   diphenhydrAMINE 25 mg capsule Commonly known as:  BENADRYL Take 1 capsule (25 mg total) by mouth every 8 (eight) hours as needed for itching or allergies.   fluticasone 50 MCG/ACT nasal spray Commonly known as:  FLONASE Place 2 sprays into both nostrils daily.   furosemide 20 MG tablet Commonly known as:  LASIX Take 1 tablet by mouth daily.   HYDROcodone-acetaminophen 5-325 MG tablet Commonly known as:  NORCO Take 1 tablet by mouth every 4 (four) hours as needed. What changed:  reasons to take this   levofloxacin 750 MG tablet Commonly known as:  LEVAQUIN Take 1 tablet (750 mg total) by mouth daily.   potassium chloride SA 20 MEQ tablet Commonly known as:  K-DUR,KLOR-CON Take 20 mEq by mouth daily.   pregabalin 75 MG capsule Commonly known as:  LYRICA Take 75 mg by mouth 3 (three) times daily.   simvastatin 10 MG tablet Commonly known as:  ZOCOR Take 1 tablet by mouth daily.   topiramate 25 MG tablet Commonly known as:  TOPAMAX Take 25 mg by mouth daily.   traZODone 50 MG tablet Commonly known as:  DESYREL Take 1 tablet by mouth at bedtime.   VICTOZA 18 MG/3ML Sopn Generic drug:  Liraglutide Inject 18 mg into the skin daily.       Vitals:   01/08/16 2253 01/09/16 0504  BP:  (!) 148/76  Pulse: 88 87  Resp: 19 18  Temp:  98.7 F (37.1 C)    Skin clean, dry and intact without evidence of skin break down, no evidence of skin tears noted. IV catheter discontinued intact. Site without signs and symptoms of  complications. Dressing and pressure applied. Pt denies pain at this time. No complaints noted.  An After Visit Summary was printed and given to the patient. Patient escorted via [place], and D/C home via private auto.  Retta Mac BSN, RN

## 2016-01-09 NOTE — Discharge Summary (Signed)
Physician Discharge Summary  Melanie Reynolds [id number] DOB: 04/29/1951 DOA: 01/04/2016  PCP: Philis Fendt, MD  Admit date: 01/04/2016 Discharge date: 01/09/2016  Admitted From: Home Disposition: Home   Recommendations for Outpatient Follow-up:  1. Follow up with PCP in 2 weeks, near end of abx course 2. Follow up smoking cessation, patient appears to be ready for quit attempt.  3. Patient has +FH of aneurysm but no evidence on MRA and MRI of the brain during this admission.   Home Health: None Equipment/Devices: None  Discharge Condition: Stable CODE STATUS: Full Diet recommendation: Heart Healthy  Brief/Interim Summary: Melanie Reynolds is a 65 y.o. female 22.5 pack-years smoker with recent pneumonia, OSA on CPAP, bipolar disorder and T2DM who presented 9/7 with fever, productive cough, and dyspnea found to have right perihilar/RML infiltrate on CXR consistent with CAP. This is the 2nd episode of pneumonia in the past couple years. Work up at arrival showed fever to 103F, dyspnea without hypoxemia, tachycardia, and leukocytosis. IV rocephin and azithromycin were started, cultures drawn, and weight-based IVF's given. No significant improvement was seen over the first 24 - 48 hours so antibiotics were broadened to include MRSA coverage. Lactate has cleared, PCT 2.21, troponins have been 0.08 and 0.07 due to demand ischemia. CT chest was ordered showing no PE and confluence of RUL infiltrate and LUL infiltrate concerning for multifocal pneumonia. Transferred to floor 9/10 with ongoing slow improvement. She has a chronic headache that she was evaluated for in July, when CT was negative. She has +FH x2 of cerebral aneurysms and there was concern for this vs. tortuous vessel on that CT. MRI with MRA to for interval follow up was negative for aneurysm or infarcts/AVM/masses. MRV was also obtained and was negative, ruling out dural venous sinus thrombosis. WBC has slowly trended downward to normal on  9/12, last febrile overnight 9/10. At 5 days, urine legionella antigen became positive. IV levaquin was started, and will be transitioned to po and continued for 14 days.   Discharge Diagnoses:  Principal Problem:   CAP (community acquired pneumonia) Active Problems:   Bipolar 1 disorder ([place])   Hypotension   Chronic pain   Diabetes ([facility])   OSA (obstructive sleep apnea)   Tobacco user   Obesity   Sepsis ([place])   AKI (acute kidney injury) ([place])  Sepsis due to community-acquired pneumonia: With colonization or MRSA and reported prior infection, abx were broadened with slow improvement. No respiratory distress. Leukocytosis resolved.  - Antibiotics per ID, she is likely ready for po abx  - Blood culture (9/7 - ) NGTD; sputum culture with mixed bacterial flora, final no growth.  - Azithromycin and rocephin (9/7 >> 9/8) - Vancomycin and cefepime (9/8 >> ) - Levaquin IV (9/11), will transition to po and continue x14 days.  - No hypoxemia; O2 by [facility] prn for SpO2: >90% - Tessalon, robitussin, IS - Duonebs TID, q4h prn  Headache: Without focal deficits, photophobia or nuchal rigidity. ?due to infection. Without meningitic signs and failure to improve on what would be appropriate abx, doubt meningitis. Evaluated in July with CT head, CTA head and neck which revealed no infarct, but 2-74mm outpouching of anterior communicating artery complex though to be tortuous vessel more than aneurysm.  - MR imaging revealed no abnormalities in arterial or venous circulation and MRI brain was wnl.   T2DM: HbA1c 6.2%. - Holding victoza - CBGs and SSI  Tobacco use: Ready to quit, brief cessation counseling provided, to follow up with  PCP.  - Nicotine patch while inpatient  AKI: Resolved - D/C IVF  Hypokalemia: Mild, acute.  - Replete  Constipation: LBM 9/9 - Senokot BID x 2 then prn  Discharge Instructions Discharge Instructions    Diet - low sodium heart healthy    Complete by:  As directed    Discharge instructions    Complete by:  As directed   You were admitted for pneumonia which was caused by a bacteria called legionella.  - The correct antibiotic has been chosen and you will need to continue taking this for 2 weeks. It is levaquin, and has been sent to your pharmacy.  - You need to follow up with your PCP in 2 weeks to be re-evaluated. You should also discuss headaches at that visit.  - If you experience fever, trouble breathing, inability to take fluids, or worsening of cough (cough may continue for several weeks)   Increase activity slowly    Complete by:  As directed       Medication List    TAKE these medications   albuterol 108 (90 Base) MCG/ACT inhaler Commonly known as:  PROVENTIL HFA;VENTOLIN HFA Inhale 2 puffs into the lungs every 6 (six) hours as needed for wheezing or shortness of breath.   diphenhydrAMINE 25 mg capsule Commonly known as:  BENADRYL Take 1 capsule (25 mg total) by mouth every 8 (eight) hours as needed for itching or allergies.   fluticasone 50 MCG/ACT nasal spray Commonly known as:  FLONASE Place 2 sprays into both nostrils daily.   furosemide 20 MG tablet Commonly known as:  LASIX Take 1 tablet by mouth daily.   HYDROcodone-acetaminophen 5-325 MG tablet Commonly known as:  NORCO Take 1 tablet by mouth every 4 (four) hours as needed. What changed:  reasons to take this   levofloxacin 750 MG tablet Commonly known as:  LEVAQUIN Take 1 tablet (750 mg total) by mouth daily.   potassium chloride SA 20 MEQ tablet Commonly known as:  K-DUR,KLOR-CON Take 20 mEq by mouth daily.   pregabalin 75 MG capsule Commonly known as:  LYRICA Take 75 mg by mouth 3 (three) times daily.   simvastatin 10 MG tablet Commonly known as:  ZOCOR Take 1 tablet by mouth daily.   topiramate 25 MG tablet Commonly known as:  TOPAMAX Take 25 mg by mouth daily.   traZODone 50 MG tablet Commonly known as:  DESYREL Take 1 tablet by mouth at bedtime.    VICTOZA 18 MG/3ML Sopn Generic drug:  Liraglutide Inject 18 mg into the skin daily.       Allergies  Allergen Reactions  . Latex Itching  . Naproxen Other (See Comments)    Abdominal pain    Consultations:  Infectious Disease, Dr. Johnnye Sima   Procedures/Studies: Dg Chest 2 View  Result Date: 01/04/2016 CLINICAL DATA:  65 year old female with cough EXAM: CHEST  2 VIEW COMPARISON:  Chest radiograph dated 08/29/2015 FINDINGS: Two views of the chest demonstrate a focal area of opacity in the right hilar/right middle lobe, new from prior study and most likely pneumonia. Underlying mass is not excluded. Follow-up to resolution is recommended. There is no pleural effusion or pneumothorax. The cardiac silhouette is within normal limits. No acute osseous pathology. IMPRESSION: Focal right perihilar/right middle lobe opacity. Findings most likely represent pneumonia. Correlation with clinical exam and follow-up to resolution is recommended to exclude underlying mass. Electronically Signed   By: Anner Crete M.D.   On: 01/04/2016 20:57   Ct Angio  Chest Pe W Or Wo Contrast  Result Date: 01/05/2016 CLINICAL DATA:  Recent car trip from [place]. Dyspnea with chest pain and tachycardia. Evaluate for pulmonary embolus. EXAM: CT ANGIOGRAPHY CHEST WITH CONTRAST TECHNIQUE: Multidetector CT imaging of the chest was performed using the standard protocol during bolus administration of intravenous contrast. Multiplanar CT image reconstructions and MIPs were obtained to evaluate the vascular anatomy. CONTRAST:  100 cc Isovue 370 COMPARISON:  Chest x-ray from yesterday. FINDINGS: Cardiovascular: Heart is borderline enlarged. No pericardial effusion. No thoracic aortic aneurysm. No dissection of the thoracic aorta. Study is degraded by image noise resulting from breathing motion and body habitus. Within this limitation, there is no large central pulmonary embolus identified. No evidence for filling defect in the  lobar or segmental pulmonary arteries to suggest acute pulmonary embolus. Subsegmental pulmonary artery show no definite pulmonary embolic disease butter less reliably evaluated. Mediastinum/Nodes: 10 mm short axis right paratracheal lymph node is associated with a 10 mm short axis subcarinal lymph node and 11 mm short axis AP window lymph node. Lungs/Pleura: There is dense airspace consolidation in the right upper lobe, clearly progressed since yesterday's x-ray. Compressive atelectasis noted in the dependent lower lobes bilaterally. No features to suggest pulmonary edema. There is some minimal peripheral subpleural airspace disease in the left upper lobe. Upper Abdomen: Unremarkable. Musculoskeletal: Bone windows reveal no worrisome lytic or sclerotic osseous lesions. Review of the MIP images confirms the above findings. IMPRESSION: 1. Limited study without evidence for large central pulmonary embolus in the main pulmonary arteries, lobar pulmonary arteries, or proximal segmental pulmonary arteries. Some segmental and the subsegmental pulmonary arteries show no evidence of embolus but are not reliably evaluated secondary to image noise resulting from body habitus and breathing motion. 2. Interval marked progression since yesterday's chest x-ray of right upper lobe airspace disease which is now confluent. Imaging features are compatible with progressive right upper lobe pneumonia. There is since associated patchy peripheral airspace disease in the left upper lobe and multifocal pneumonia could have this appearance. Follow-up imaging recommended to ensure complete resolution of the right upper lobe disease and exclude underlying central obstructing lesion. 3. Borderline mediastinal lymphadenopathy likely reactive to the right upper lobe disease. Electronically Signed   By: Misty Stanley M.D.   On: 01/05/2016 19:33   Mr Jodene Nam Head Wo Contrast  Result Date: 01/08/2016 CLINICAL DATA:  Headache.  Pneumonia. EXAM: MRI  HEAD WITHOUT AND WITH CONTRAST MRV HEAD WITHOUT CONTRAST TECHNIQUE: Multiplanar, multiecho pulse sequences of the brain and surrounding structures were obtained without and with intravenous contrast. Angiographic images of the intracranial arterial and venous structures were obtained using MRA and MRV technique without intravenous contrast. CONTRAST:  74mL MULTIHANCE GADOBENATE DIMEGLUMINE 529 MG/ML IV SOLN COMPARISON:  Head CT 11/04/2015. FINDINGS: Brain: No acute infarct or intraparenchymal hemorrhage. The midline structures are normal. There is mild subcortical white matter hyperintense T2 weighted signal, not greater than expected for age. No other focal parenchymal signal abnormality. No mass lesion or midline shift. No hydrocephalus or extra-axial fluid collection. There is very mild dural contrast enhancement. Vascular: Major intracranial flow voids are preserved. No evidence of chronic microhemorrhage or amyloid angiopathy. Skull and upper cervical spine: The visualized skull base, calvarium, upper cervical spine and extracranial soft tissues are normal. Sinuses/Orbits: No fluid levels or advanced mucosal thickening. No mastoid effusion. Normal orbits. Intracranial internal carotid arteries: Normal. Anterior cerebral arteries: Normal. Middle cerebral arteries: Normal. Posterior communicating arteries: Present on the right. Posterior cerebral arteries: Normal. Basilar  artery: Normal. Vertebral arteries: Left dominant. Normal. Superior cerebellar arteries: Normal. Anterior inferior cerebellar arteries: Normal. Posterior inferior cerebellar arteries: Normal. Superior sagittal sinus: Normal. Straight sinus: Normal. Inferior sagittal sinus, vein of Galen and internal cerebral veins: Normal. Transverse sinuses: Normal. Sigmoid sinuses: Normal. Visualized jugular veins: Normal. IMPRESSION: 1. No acute intracranial abnormality. 2. Normal MRA and MRV of the intracranial circulation. 3. Faint dural contrast  enhancement is of uncertain significance. But, in the context of headache, this could indicate intracranial hypotension. No other secondary findings of low CSF pressure. Electronically Signed   By: Ulyses Jarred M.D.   On: 01/08/2016 22:38   Mr Jeri Cos [id number] Contrast  Result Date: 01/08/2016 CLINICAL DATA:  Headache.  Pneumonia. EXAM: MRI HEAD WITHOUT AND WITH CONTRAST MRV HEAD WITHOUT CONTRAST TECHNIQUE: Multiplanar, multiecho pulse sequences of the brain and surrounding structures were obtained without and with intravenous contrast. Angiographic images of the intracranial arterial and venous structures were obtained using MRA and MRV technique without intravenous contrast. CONTRAST:  95mL MULTIHANCE GADOBENATE DIMEGLUMINE 529 MG/ML IV SOLN COMPARISON:  Head CT 11/04/2015. FINDINGS: Brain: No acute infarct or intraparenchymal hemorrhage. The midline structures are normal. There is mild subcortical white matter hyperintense T2 weighted signal, not greater than expected for age. No other focal parenchymal signal abnormality. No mass lesion or midline shift. No hydrocephalus or extra-axial fluid collection. There is very mild dural contrast enhancement. Vascular: Major intracranial flow voids are preserved. No evidence of chronic microhemorrhage or amyloid angiopathy. Skull and upper cervical spine: The visualized skull base, calvarium, upper cervical spine and extracranial soft tissues are normal. Sinuses/Orbits: No fluid levels or advanced mucosal thickening. No mastoid effusion. Normal orbits. Intracranial internal carotid arteries: Normal. Anterior cerebral arteries: Normal. Middle cerebral arteries: Normal. Posterior communicating arteries: Present on the right. Posterior cerebral arteries: Normal. Basilar artery: Normal. Vertebral arteries: Left dominant. Normal. Superior cerebellar arteries: Normal. Anterior inferior cerebellar arteries: Normal. Posterior inferior cerebellar arteries: Normal. Superior sagittal  sinus: Normal. Straight sinus: Normal. Inferior sagittal sinus, vein of Galen and internal cerebral veins: Normal. Transverse sinuses: Normal. Sigmoid sinuses: Normal. Visualized jugular veins: Normal. IMPRESSION: 1. No acute intracranial abnormality. 2. Normal MRA and MRV of the intracranial circulation. 3. Faint dural contrast enhancement is of uncertain significance. But, in the context of headache, this could indicate intracranial hypotension. No other secondary findings of low CSF pressure. Electronically Signed   By: Ulyses Jarred M.D.   On: 01/08/2016 22:38   Mr Annell Greening Head  Result Date: 01/08/2016 CLINICAL DATA:  Headache.  Pneumonia. EXAM: MRI HEAD WITHOUT AND WITH CONTRAST MRV HEAD WITHOUT CONTRAST TECHNIQUE: Multiplanar, multiecho pulse sequences of the brain and surrounding structures were obtained without and with intravenous contrast. Angiographic images of the intracranial arterial and venous structures were obtained using MRA and MRV technique without intravenous contrast. CONTRAST:  14mL MULTIHANCE GADOBENATE DIMEGLUMINE 529 MG/ML IV SOLN COMPARISON:  Head CT 11/04/2015. FINDINGS: Brain: No acute infarct or intraparenchymal hemorrhage. The midline structures are normal. There is mild subcortical white matter hyperintense T2 weighted signal, not greater than expected for age. No other focal parenchymal signal abnormality. No mass lesion or midline shift. No hydrocephalus or extra-axial fluid collection. There is very mild dural contrast enhancement. Vascular: Major intracranial flow voids are preserved. No evidence of chronic microhemorrhage or amyloid angiopathy. Skull and upper cervical spine: The visualized skull base, calvarium, upper cervical spine and extracranial soft tissues are normal. Sinuses/Orbits: No fluid levels or advanced mucosal thickening. No mastoid effusion. Normal orbits. Intracranial  internal carotid arteries: Normal. Anterior cerebral arteries: Normal. Middle cerebral  arteries: Normal. Posterior communicating arteries: Present on the right. Posterior cerebral arteries: Normal. Basilar artery: Normal. Vertebral arteries: Left dominant. Normal. Superior cerebellar arteries: Normal. Anterior inferior cerebellar arteries: Normal. Posterior inferior cerebellar arteries: Normal. Superior sagittal sinus: Normal. Straight sinus: Normal. Inferior sagittal sinus, vein of Galen and internal cerebral veins: Normal. Transverse sinuses: Normal. Sigmoid sinuses: Normal. Visualized jugular veins: Normal. IMPRESSION: 1. No acute intracranial abnormality. 2. Normal MRA and MRV of the intracranial circulation. 3. Faint dural contrast enhancement is of uncertain significance. But, in the context of headache, this could indicate intracranial hypotension. No other secondary findings of low CSF pressure. Electronically Signed   By: Ulyses Jarred M.D.   On: 01/08/2016 22:38    Echo 9/9:  Study Conclusions  - Procedure narrative: Transthoracic echocardiography. Image   quality was suboptimal. The study was technically difficult, as a   result of body habitus. Intravenous contrast (Definity) was   administered. - Left ventricle: The cavity size was normal. Wall thickness was   increased in a pattern of moderate LVH. Systolic function was   vigorous. The estimated ejection fraction was in the range of 65%   to 70%. Left ventricular diastolic function parameters were   normal.  Subjective: Pt much improved, taking po, ambulating. Coughing more sputum, no fevers or dyspnea.   Discharge Exam: Vitals:   01/08/16 2253 01/09/16 0504  BP:  (!) 148/76  Pulse: 88 87  Resp: 19 18  Temp:  98.7 F (37.1 C)   Vitals:   01/08/16 1700 01/08/16 2100 01/08/16 2253 01/09/16 0504  BP: 134/71 (!) 150/79  (!) 148/76  Pulse: 92 87 88 87  Resp: 18 18 19 18   Temp: 99.4 F (37.4 C) 98.8 F (37.1 C)  98.7 F (37.1 C)  TempSrc: Oral Oral  Oral  SpO2: 97% 100% 98% 99%  Weight:  107.3 kg (236 lb  8.9 oz)    Height:       General: Pt is alert, awake, not in acute distress Cardiovascular: RRR, S1/S2 +, no rubs, no gallops Respiratory: Nonlabored, normal rate 100% breathing room air. Sparse scattered rhonchi and improved air movement bilaterally. No wheezes. No crackles at bases.  Abdominal: Soft, NT, ND, bowel sounds + Extremities: no edema, no cyanosis  The results of significant diagnostics from this hospitalization (including imaging, microbiology, ancillary and laboratory) are listed below for reference.    Microbiology: Recent Results (from the past 240 hour(s))  Urine culture     Status: None   Collection Time: 01/04/16  7:26 PM  Result Value Ref Range Status   Specimen Description URINE, RANDOM  Final   Special Requests NONE  Final   Culture NO GROWTH  Final   Report Status 01/05/2016 FINAL  Final  Culture, blood (Routine X 2)     Status: None (Preliminary result)   Collection Time: 01/04/16  7:35 PM  Result Value Ref Range Status   Specimen Description BLOOD LEFT ANTECUBITAL  Final   Special Requests IN PEDIATRIC BOTTLE 1CC  Final   Culture NO GROWTH 4 DAYS  Final   Report Status PENDING  Incomplete  Culture, blood (Routine X 2)     Status: None (Preliminary result)   Collection Time: 01/04/16  7:56 PM  Result Value Ref Range Status   Specimen Description BLOOD RIGHT ARM  Final   Special Requests BOTTLES DRAWN AEROBIC AND ANAEROBIC 5CC  Final   Culture NO GROWTH 4  DAYS  Final   Report Status PENDING  Incomplete  MRSA PCR Screening     Status: Abnormal   Collection Time: 01/05/16  1:12 AM  Result Value Ref Range Status   MRSA by PCR POSITIVE (A) NEGATIVE Final    Comment:        The GeneXpert MRSA Assay (FDA approved for NASAL specimens only), is one component of a comprehensive MRSA colonization surveillance program. It is not intended to diagnose MRSA infection nor to guide or monitor treatment for MRSA infections. RESULT CALLED TO, READ BACK BY AND  VERIFIED WITH: TOMLINSON,RN @0338  01/05/16 MKELLY   Culture, expectorated sputum-assessment     Status: None   Collection Time: 01/06/16 10:10 AM  Result Value Ref Range Status   Specimen Description SPUTUM  Final   Special Requests NONE  Final   Sputum evaluation   Final    THIS SPECIMEN IS ACCEPTABLE. RESPIRATORY CULTURE REPORT TO FOLLOW.   Report Status 01/06/2016 FINAL  Final  Culture, respiratory (NON-Expectorated)     Status: None   Collection Time: 01/06/16 10:10 AM  Result Value Ref Range Status   Specimen Description SPUTUM  Final   Special Requests NONE  Final   Gram Stain   Final    FEW WBC PRESENT, PREDOMINANTLY PMN FEW SQUAMOUS EPITHELIAL CELLS PRESENT FEW GRAM NEGATIVE RODS RARE GRAM POSITIVE COCCI IN PAIRS RARE GRAM VARIABLE ROD    Culture Consistent with normal respiratory flora.  Final   Report Status 01/08/2016 FINAL  Final     Labs: BNP (last 3 results) No results for input(s): BNP in the last 8760 hours. Basic Metabolic Panel:  Recent Labs Lab 01/05/16 0239 01/05/16 0525 01/06/16 0212 01/07/16 0724 01/09/16 0359  NA 137 139 140 136 140  K 3.6 3.3* 4.3 3.6 3.3*  CL 109 112* 112* 110 111  CO2 19* 19* 18* 16* 21*  GLUCOSE 129* 118* 80 154* 141*  BUN 10 10 8 6 6   CREATININE 1.03* 1.00 0.95 0.81 0.65  CALCIUM 8.1* 8.0* 8.1* 8.3* 8.5*   Liver Function Tests:  Recent Labs Lab 01/04/16 1926 01/05/16 0239 01/05/16 0525  AST 33 25 26  ALT 35 30 29  ALKPHOS 81 59 59  BILITOT 1.8* 1.2 1.6*  PROT 7.9 5.9* 6.1*  ALBUMIN 3.4* 2.5* 2.5*   No results for input(s): LIPASE, AMYLASE in the last 168 hours. No results for input(s): AMMONIA in the last 168 hours. CBC:  Recent Labs Lab 01/04/16 1941 01/05/16 0239 01/05/16 0525 01/06/16 0212 01/07/16 0724 01/09/16 0359  WBC 24.7* 12.5* 15.2* 17.7* 12.3* 9.1  NEUTROABS 21.1* 10.6*  --   --   --   --   HGB 14.1 12.7 12.7 12.4 11.7* 11.0*  HCT 43.1 39.7 40.3 39.1 37.1 34.2*  MCV 90.4 89.4 89.6  91.4 89.6 88.8  PLT 279 250 260 277 272 305   Cardiac Enzymes:  Recent Labs Lab 01/05/16 0012 01/05/16 0525  TROPONINI 0.08* 0.07*   BNP: Invalid input(s): POCBNP CBG:  Recent Labs Lab 01/08/16 1156 01/08/16 1643 01/08/16 2238 01/09/16 0730 01/09/16 1132  GLUCAP 136* 139* 117* 142* 113*   D-Dimer No results for input(s): DDIMER in the last 72 hours. Hgb A1c No results for input(s): HGBA1C in the last 72 hours. Lipid Profile No results for input(s): CHOL, HDL, LDLCALC, TRIG, CHOLHDL, LDLDIRECT in the last 72 hours. Thyroid function studies No results for input(s): TSH, T4TOTAL, T3FREE, THYROIDAB in the last 72 hours.  Invalid input(s): FREET3  Anemia work up No results for input(s): VITAMINB12, FOLATE, FERRITIN, TIBC, IRON, RETICCTPCT in the last 72 hours. Urinalysis    Component Value Date/Time   COLORURINE AMBER (A) 01/04/2016 1926   APPEARANCEUR HAZY (A) 01/04/2016 1926   LABSPEC 1.024 01/04/2016 1926   PHURINE 5.5 01/04/2016 1926   GLUCOSEU NEGATIVE 01/04/2016 1926   HGBUR SMALL (A) 01/04/2016 1926   BILIRUBINUR SMALL (A) 01/04/2016 1926   KETONESUR 15 (A) 01/04/2016 1926   PROTEINUR 100 (A) 01/04/2016 1926   UROBILINOGEN 1.0 04/02/2014 1230   NITRITE NEGATIVE 01/04/2016 1926   LEUKOCYTESUR TRACE (A) 01/04/2016 1926   Sepsis Labs Invalid input(s): PROCALCITONIN,  WBC,  LACTICIDVEN Microbiology Recent Results (from the past 240 hour(s))  Urine culture     Status: None   Collection Time: 01/04/16  7:26 PM  Result Value Ref Range Status   Specimen Description URINE, RANDOM  Final   Special Requests NONE  Final   Culture NO GROWTH  Final   Report Status 01/05/2016 FINAL  Final  Culture, blood (Routine X 2)     Status: None (Preliminary result)   Collection Time: 01/04/16  7:35 PM  Result Value Ref Range Status   Specimen Description BLOOD LEFT ANTECUBITAL  Final   Special Requests IN PEDIATRIC BOTTLE 1CC  Final   Culture NO GROWTH 4 DAYS  Final    Report Status PENDING  Incomplete  Culture, blood (Routine X 2)     Status: None (Preliminary result)   Collection Time: 01/04/16  7:56 PM  Result Value Ref Range Status   Specimen Description BLOOD RIGHT ARM  Final   Special Requests BOTTLES DRAWN AEROBIC AND ANAEROBIC 5CC  Final   Culture NO GROWTH 4 DAYS  Final   Report Status PENDING  Incomplete  MRSA PCR Screening     Status: Abnormal   Collection Time: 01/05/16  1:12 AM  Result Value Ref Range Status   MRSA by PCR POSITIVE (A) NEGATIVE Final    Comment:        The GeneXpert MRSA Assay (FDA approved for NASAL specimens only), is one component of a comprehensive MRSA colonization surveillance program. It is not intended to diagnose MRSA infection nor to guide or monitor treatment for MRSA infections. RESULT CALLED TO, READ BACK BY AND VERIFIED WITH: TOMLINSON,RN @0338  01/05/16 MKELLY   Culture, expectorated sputum-assessment     Status: None   Collection Time: 01/06/16 10:10 AM  Result Value Ref Range Status   Specimen Description SPUTUM  Final   Special Requests NONE  Final   Sputum evaluation   Final    THIS SPECIMEN IS ACCEPTABLE. RESPIRATORY CULTURE REPORT TO FOLLOW.   Report Status 01/06/2016 FINAL  Final  Culture, respiratory (NON-Expectorated)     Status: None   Collection Time: 01/06/16 10:10 AM  Result Value Ref Range Status   Specimen Description SPUTUM  Final   Special Requests NONE  Final   Gram Stain   Final    FEW WBC PRESENT, PREDOMINANTLY PMN FEW SQUAMOUS EPITHELIAL CELLS PRESENT FEW GRAM NEGATIVE RODS RARE GRAM POSITIVE COCCI IN PAIRS RARE GRAM VARIABLE ROD    Culture Consistent with normal respiratory flora.  Final   Report Status 01/08/2016 FINAL  Final    Time coordinating discharge: Over 30 minutes  Vance Gather, MD  [facility] 01/09/2016, 12:17 PM Pager [phone]  If 7PM-7AM, please contact night-coverage [email or web address] Password [email or web address]

## 2016-01-09 NOTE — Progress Notes (Signed)
Patient refused bed alarm.    

## 2016-01-09 NOTE — Progress Notes (Signed)
PROGRESS NOTE  Melanie Reynolds  [id number] DOB: Nov 13, 1950 DOA: 01/04/2016 PCP: Philis Fendt, MD  Outpatient Specialists: [facility], Dr. Corrie Dandy  Brief Narrative: Melanie Reynolds is a 65 y.o. female 22.5 pack-years smoker with recent pneumonia, OSA on CPAP, bipolar disorder and T2DM who presented 9/7 with fever, productive cough, and dyspnea found to have right perihilar/RML infiltrate on CXR consistent with CAP. This is the 2nd episode of pneumonia in the past couple years. Work up at arrival showed fever to 103F, dyspnea without hypoxemia, tachycardia, and leukocytosis. IV rocephin and azithromycin were started, cultures drawn, and weight-based IVF's given. No significant improvement was seen over the first 24 - 48 hours so antibiotics were broadened to include MRSA coverage. Lactate has cleared, PCT 2.21, troponins have been 0.08 and 0.07 due to demand ischemia. CT chest was ordered showing no PE and confluence of RUL infiltrate and LUL infiltrate concerning for multifocal pneumonia. Transferred to floor 9/10 with ongoing slow improvement. She has a chronic headache that she was evaluated for in July, when CT was negative. She has +FH x2 of cerebral aneurysms and there was concern for this vs. tortuous vessel on that CT. MRI with MRA to for interval follow up was negative for aneurysm or infarcts/AVM/masses. MRV was also obtained and ruled out dural venous sinus thrombosis. WBC has slowly trended downward to normal on 9/12, last febrile overnight 9/10. Plan to D/C IVF, telemetry, and IV antibiotics.   Assessment & Plan: Principal Problem:   CAP (community acquired pneumonia) Active Problems:   Bipolar 1 disorder ([place])   Hypotension   Chronic pain   Diabetes ([facility])   OSA (obstructive sleep apnea)   Tobacco user   Obesity   Sepsis ([place])   AKI (acute kidney injury) ([place])  Sepsis due to community-acquired pneumonia: With colonization or MRSA and reported prior infection, abx were  broadened with slow improvement. No respiratory distress. Leukocytosis resolved.  - Antibiotics per ID, she is likely ready for po abx  - Blood culture (9/7 - ) NGTD; sputum culture with mixed bacterial flora, final no growth.  - Azithromycin and rocephin (9/7 >> 9/8) - Vancomycin and cefepime (9/8 >> ) - Levaquin IV (9/11 >> ) - No hypoxemia; O2 by [place] prn for SpO2: >90% - Tessalon, robitussin, IS - Duonebs TID, q4h prn  Headache: Without focal deficits, photophobia or nuchal rigidity. ?due to infection. Without meningitic signs and failure to improve on what would be appropriate abx, doubt meningitis. Evaluated in July with CT head, CTA head and neck which revealed no infarct, but 2-42mm outpouching of anterior communicating artery complex though to be tortuous vessel more than aneurysm.  - MR imaging revealed no abnormalities in arterial or venous circulation and MRI brain was wnl.   T2DM: HbA1c 6.2%. - Holding victoza - CBGs and SSI  Tobacco use: Ready to quit, brief cessation counseling provided, to follow up with PCP.  - Nicotine patch while inpatient  AKI: Resolved - D/C IVF  Hypokalemia: Mild, acute.  - Replete  Constipation: LBM 9/9 - Senokot BID x 2 then prn  DVT prophylaxis: Lovenox Code Status: Full Family Communication: None at bedside this AM. Again declined offer to contact them.  Disposition Plan: Transition to po abx today (awaiting ID recommendation) and discharge today vs. tomorrow if clinical course continues to improve.   Consultants:   Infectious disease consulted 9/9  Procedures:   None  Antimicrobials: - Azithromycin, CTX (9/7 >> 9/8) - Vancomycin, cefepime (9/8 >> )  Subjective: Better today than yesterday, coughing up more sputum. HA remains, somewhat better. No trouble speaking or weakness/numbness.   Objective: Vitals:   01/08/16 1700 01/08/16 2100 01/08/16 2253 01/09/16 0504  BP: 134/71 (!) 150/79  (!) 148/76  Pulse: 92 87 88 87  Resp:  18 18 19 18   Temp: 99.4 F (37.4 C) 98.8 F (37.1 C)  98.7 F (37.1 C)  TempSrc: Oral Oral  Oral  SpO2: 97% 100% 98% 99%  Weight:  107.3 kg (236 lb 8.9 oz)    Height:        Intake/Output Summary (Last 24 hours) at 01/09/16 1024 Last data filed at 01/09/16 0600  Gross per 24 hour  Intake             3365 ml  Output             1475 ml  Net             1890 ml   Filed Weights   01/05/16 0105 01/07/16 2059 01/08/16 2100  Weight: 106.2 kg (234 lb 2.1 oz) 107.1 kg (236 lb 3.2 oz) 107.3 kg (236 lb 8.9 oz)    Examination: General exam: 65 y.o. female in no distress HEENT: MMM, oropharynx clear Respiratory system: Nonlabored, normal rate 100% breathing room air. Sparse scattered rhonchi and improved air movement bilaterally. No wheezes. No crackles at bases.  Cardiovascular system: Tachycardic but regular. No murmur, rub, or gallop. No JVD, limited due to habitus, and no pedal edema. Gastrointestinal system: Abdomen soft, non-tender, non-distended, with normoactive bowel sounds. No organomegaly or masses felt. Central nervous system: Alert and oriented. No focal neurological deficits. Speech normal.  Extremities: Warm, no deformities. Negative Homan's.  Skin: No rashes, lesions no ulcers Psychiatry: Judgement and insight appear normal. Mood & affect appropriate.   Data Reviewed: I have personally reviewed following labs and imaging studies  CBC:  Recent Labs Lab 01/04/16 1941 01/05/16 0239 01/05/16 0525 01/06/16 0212 01/07/16 0724 01/09/16 0359  WBC 24.7* 12.5* 15.2* 17.7* 12.3* 9.1  NEUTROABS 21.1* 10.6*  --   --   --   --   HGB 14.1 12.7 12.7 12.4 11.7* 11.0*  HCT 43.1 39.7 40.3 39.1 37.1 34.2*  MCV 90.4 89.4 89.6 91.4 89.6 88.8  PLT 279 250 260 277 272 [id number]   Basic Metabolic Panel:  Recent Labs Lab 01/05/16 0239 01/05/16 0525 01/06/16 0212 01/07/16 0724 01/09/16 0359  NA 137 139 140 136 140  K 3.6 3.3* 4.3 3.6 3.3*  CL 109 112* 112* 110 111  CO2 19* 19* 18*  16* 21*  GLUCOSE 129* 118* 80 154* 141*  BUN 10 10 8 6 6   CREATININE 1.03* 1.00 0.95 0.81 0.65  CALCIUM 8.1* 8.0* 8.1* 8.3* 8.5*   GFR: Estimated Creatinine Clearance: 84.5 mL/min (by C-G formula based on SCr of 0.8 mg/dL). Liver Function Tests:  Recent Labs Lab 01/04/16 1926 01/05/16 0239 01/05/16 0525  AST 33 25 26  ALT 35 30 29  ALKPHOS 81 59 59  BILITOT 1.8* 1.2 1.6*  PROT 7.9 5.9* 6.1*  ALBUMIN 3.4* 2.5* 2.5*   No results for input(s): LIPASE, AMYLASE in the last 168 hours. No results for input(s): AMMONIA in the last 168 hours. Coagulation Profile:  Recent Labs Lab 01/05/16 0012  INR 1.31   Cardiac Enzymes:  Recent Labs Lab 01/05/16 0012 01/05/16 0525  TROPONINI 0.08* 0.07*   BNP (last 3 results) No results for input(s): PROBNP in the last 8760 hours. HbA1C:  No results for input(s): HGBA1C in the last 72 hours. CBG:  Recent Labs Lab 01/08/16 0757 01/08/16 1156 01/08/16 1643 01/08/16 2238 01/09/16 0730  GLUCAP 137* 136* 139* 117* 142*   Lipid Profile: No results for input(s): CHOL, HDL, LDLCALC, TRIG, CHOLHDL, LDLDIRECT in the last 72 hours. Thyroid Function Tests: No results for input(s): TSH, T4TOTAL, FREET4, T3FREE, THYROIDAB in the last 72 hours. Anemia Panel: No results for input(s): VITAMINB12, FOLATE, FERRITIN, TIBC, IRON, RETICCTPCT in the last 72 hours. Urine analysis:    Component Value Date/Time   COLORURINE AMBER (A) 01/04/2016 1926   APPEARANCEUR HAZY (A) 01/04/2016 1926   LABSPEC 1.024 01/04/2016 1926   PHURINE 5.5 01/04/2016 1926   GLUCOSEU NEGATIVE 01/04/2016 1926   HGBUR SMALL (A) 01/04/2016 1926   BILIRUBINUR SMALL (A) 01/04/2016 1926   KETONESUR 15 (A) 01/04/2016 1926   PROTEINUR 100 (A) 01/04/2016 1926   UROBILINOGEN 1.0 04/02/2014 1230   NITRITE NEGATIVE 01/04/2016 1926   LEUKOCYTESUR TRACE (A) 01/04/2016 1926   Sepsis Labs: @LABRCNTIP (procalcitonin:4,lacticidven:4)  ) Recent Results (from the past 240 hour(s))    Urine culture     Status: None   Collection Time: 01/04/16  7:26 PM  Result Value Ref Range Status   Specimen Description URINE, RANDOM  Final   Special Requests NONE  Final   Culture NO GROWTH  Final   Report Status 01/05/2016 FINAL  Final  Culture, blood (Routine X 2)     Status: None (Preliminary result)   Collection Time: 01/04/16  7:35 PM  Result Value Ref Range Status   Specimen Description BLOOD LEFT ANTECUBITAL  Final   Special Requests IN PEDIATRIC BOTTLE 1CC  Final   Culture NO GROWTH 4 DAYS  Final   Report Status PENDING  Incomplete  Culture, blood (Routine X 2)     Status: None (Preliminary result)   Collection Time: 01/04/16  7:56 PM  Result Value Ref Range Status   Specimen Description BLOOD RIGHT ARM  Final   Special Requests BOTTLES DRAWN AEROBIC AND ANAEROBIC 5CC  Final   Culture NO GROWTH 4 DAYS  Final   Report Status PENDING  Incomplete  MRSA PCR Screening     Status: Abnormal   Collection Time: 01/05/16  1:12 AM  Result Value Ref Range Status   MRSA by PCR POSITIVE (A) NEGATIVE Final    Comment:        The GeneXpert MRSA Assay (FDA approved for NASAL specimens only), is one component of a comprehensive MRSA colonization surveillance program. It is not intended to diagnose MRSA infection nor to guide or monitor treatment for MRSA infections. RESULT CALLED TO, READ BACK BY AND VERIFIED WITH: TOMLINSON,RN @0338  01/05/16 MKELLY   Culture, expectorated sputum-assessment     Status: None   Collection Time: 01/06/16 10:10 AM  Result Value Ref Range Status   Specimen Description SPUTUM  Final   Special Requests NONE  Final   Sputum evaluation   Final    THIS SPECIMEN IS ACCEPTABLE. RESPIRATORY CULTURE REPORT TO FOLLOW.   Report Status 01/06/2016 FINAL  Final  Culture, respiratory (NON-Expectorated)     Status: None   Collection Time: 01/06/16 10:10 AM  Result Value Ref Range Status   Specimen Description SPUTUM  Final   Special Requests NONE  Final    Gram Stain   Final    FEW WBC PRESENT, PREDOMINANTLY PMN FEW SQUAMOUS EPITHELIAL CELLS PRESENT FEW GRAM NEGATIVE RODS RARE GRAM POSITIVE COCCI IN PAIRS RARE GRAM VARIABLE ROD  Culture Consistent with normal respiratory flora.  Final   Report Status 01/08/2016 FINAL  Final     Radiology Studies: Mr Virgel Paling [id number] Contrast  Result Date: 01/08/2016 CLINICAL DATA:  Headache.  Pneumonia. EXAM: MRI HEAD WITHOUT AND WITH CONTRAST MRV HEAD WITHOUT CONTRAST TECHNIQUE: Multiplanar, multiecho pulse sequences of the brain and surrounding structures were obtained without and with intravenous contrast. Angiographic images of the intracranial arterial and venous structures were obtained using MRA and MRV technique without intravenous contrast. CONTRAST:  13mL MULTIHANCE GADOBENATE DIMEGLUMINE 529 MG/ML IV SOLN COMPARISON:  Head CT 11/04/2015. FINDINGS: Brain: No acute infarct or intraparenchymal hemorrhage. The midline structures are normal. There is mild subcortical white matter hyperintense T2 weighted signal, not greater than expected for age. No other focal parenchymal signal abnormality. No mass lesion or midline shift. No hydrocephalus or extra-axial fluid collection. There is very mild dural contrast enhancement. Vascular: Major intracranial flow voids are preserved. No evidence of chronic microhemorrhage or amyloid angiopathy. Skull and upper cervical spine: The visualized skull base, calvarium, upper cervical spine and extracranial soft tissues are normal. Sinuses/Orbits: No fluid levels or advanced mucosal thickening. No mastoid effusion. Normal orbits. Intracranial internal carotid arteries: Normal. Anterior cerebral arteries: Normal. Middle cerebral arteries: Normal. Posterior communicating arteries: Present on the right. Posterior cerebral arteries: Normal. Basilar artery: Normal. Vertebral arteries: Left dominant. Normal. Superior cerebellar arteries: Normal. Anterior inferior cerebellar arteries:  Normal. Posterior inferior cerebellar arteries: Normal. Superior sagittal sinus: Normal. Straight sinus: Normal. Inferior sagittal sinus, vein of Galen and internal cerebral veins: Normal. Transverse sinuses: Normal. Sigmoid sinuses: Normal. Visualized jugular veins: Normal. IMPRESSION: 1. No acute intracranial abnormality. 2. Normal MRA and MRV of the intracranial circulation. 3. Faint dural contrast enhancement is of uncertain significance. But, in the context of headache, this could indicate intracranial hypotension. No other secondary findings of low CSF pressure. Electronically Signed   By: Ulyses Jarred M.D.   On: 01/08/2016 22:38   Mr Jeri Cos [id number] Contrast  Result Date: 01/08/2016 CLINICAL DATA:  Headache.  Pneumonia. EXAM: MRI HEAD WITHOUT AND WITH CONTRAST MRV HEAD WITHOUT CONTRAST TECHNIQUE: Multiplanar, multiecho pulse sequences of the brain and surrounding structures were obtained without and with intravenous contrast. Angiographic images of the intracranial arterial and venous structures were obtained using MRA and MRV technique without intravenous contrast. CONTRAST:  66mL MULTIHANCE GADOBENATE DIMEGLUMINE 529 MG/ML IV SOLN COMPARISON:  Head CT 11/04/2015. FINDINGS: Brain: No acute infarct or intraparenchymal hemorrhage. The midline structures are normal. There is mild subcortical white matter hyperintense T2 weighted signal, not greater than expected for age. No other focal parenchymal signal abnormality. No mass lesion or midline shift. No hydrocephalus or extra-axial fluid collection. There is very mild dural contrast enhancement. Vascular: Major intracranial flow voids are preserved. No evidence of chronic microhemorrhage or amyloid angiopathy. Skull and upper cervical spine: The visualized skull base, calvarium, upper cervical spine and extracranial soft tissues are normal. Sinuses/Orbits: No fluid levels or advanced mucosal thickening. No mastoid effusion. Normal orbits. Intracranial internal  carotid arteries: Normal. Anterior cerebral arteries: Normal. Middle cerebral arteries: Normal. Posterior communicating arteries: Present on the right. Posterior cerebral arteries: Normal. Basilar artery: Normal. Vertebral arteries: Left dominant. Normal. Superior cerebellar arteries: Normal. Anterior inferior cerebellar arteries: Normal. Posterior inferior cerebellar arteries: Normal. Superior sagittal sinus: Normal. Straight sinus: Normal. Inferior sagittal sinus, vein of Galen and internal cerebral veins: Normal. Transverse sinuses: Normal. Sigmoid sinuses: Normal. Visualized jugular veins: Normal. IMPRESSION: 1. No acute intracranial abnormality. 2. Normal MRA and MRV  of the intracranial circulation. 3. Faint dural contrast enhancement is of uncertain significance. But, in the context of headache, this could indicate intracranial hypotension. No other secondary findings of low CSF pressure. Electronically Signed   By: Ulyses Jarred M.D.   On: 01/08/2016 22:38   Mr Annell Greening Head  Result Date: 01/08/2016 CLINICAL DATA:  Headache.  Pneumonia. EXAM: MRI HEAD WITHOUT AND WITH CONTRAST MRV HEAD WITHOUT CONTRAST TECHNIQUE: Multiplanar, multiecho pulse sequences of the brain and surrounding structures were obtained without and with intravenous contrast. Angiographic images of the intracranial arterial and venous structures were obtained using MRA and MRV technique without intravenous contrast. CONTRAST:  56mL MULTIHANCE GADOBENATE DIMEGLUMINE 529 MG/ML IV SOLN COMPARISON:  Head CT 11/04/2015. FINDINGS: Brain: No acute infarct or intraparenchymal hemorrhage. The midline structures are normal. There is mild subcortical white matter hyperintense T2 weighted signal, not greater than expected for age. No other focal parenchymal signal abnormality. No mass lesion or midline shift. No hydrocephalus or extra-axial fluid collection. There is very mild dural contrast enhancement. Vascular: Major intracranial flow voids are  preserved. No evidence of chronic microhemorrhage or amyloid angiopathy. Skull and upper cervical spine: The visualized skull base, calvarium, upper cervical spine and extracranial soft tissues are normal. Sinuses/Orbits: No fluid levels or advanced mucosal thickening. No mastoid effusion. Normal orbits. Intracranial internal carotid arteries: Normal. Anterior cerebral arteries: Normal. Middle cerebral arteries: Normal. Posterior communicating arteries: Present on the right. Posterior cerebral arteries: Normal. Basilar artery: Normal. Vertebral arteries: Left dominant. Normal. Superior cerebellar arteries: Normal. Anterior inferior cerebellar arteries: Normal. Posterior inferior cerebellar arteries: Normal. Superior sagittal sinus: Normal. Straight sinus: Normal. Inferior sagittal sinus, vein of Galen and internal cerebral veins: Normal. Transverse sinuses: Normal. Sigmoid sinuses: Normal. Visualized jugular veins: Normal. IMPRESSION: 1. No acute intracranial abnormality. 2. Normal MRA and MRV of the intracranial circulation. 3. Faint dural contrast enhancement is of uncertain significance. But, in the context of headache, this could indicate intracranial hypotension. No other secondary findings of low CSF pressure. Electronically Signed   By: Ulyses Jarred M.D.   On: 01/08/2016 22:38    Scheduled Meds: . ceFEPime (MAXIPIME) IV  2 g Intravenous Q8H  . chlorhexidine  15 mL Mouth/Throat BID  . enoxaparin (LOVENOX) injection  40 mg Subcutaneous Q24H  . fluticasone  2 spray Each Nare Daily  . guaiFENesin  600 mg Oral BID  . insulin aspart  0-20 Units Subcutaneous TID WC  . insulin aspart  0-5 Units Subcutaneous QHS  . levofloxacin (LEVAQUIN) IV  750 mg Intravenous Q24H  . mupirocin ointment  1 application Nasal BID  . nicotine  14 mg Transdermal Daily  . pregabalin  75 mg Oral TID  . senna-docusate  1 tablet Oral BID  . simvastatin  10 mg Oral Daily  . topiramate  25 mg Oral Daily  . traZODone  50 mg  Oral QHS  . vancomycin  750 mg Intravenous Q12H   Continuous Infusions:     LOS: 5 days   Time spent: 25 minutes.  Vance Gather, MD [facility] Pager [phone]  If 7PM-7AM, please contact night-coverage [email or web address] Password [facility] 01/09/2016, 10:24 AM

## 2016-05-28 ENCOUNTER — Encounter (HOSPITAL_COMMUNITY): Payer: Self-pay | Admitting: Emergency Medicine

## 2016-05-28 ENCOUNTER — Ambulatory Visit (INDEPENDENT_AMBULATORY_CARE_PROVIDER_SITE_OTHER): Payer: Medicare Other

## 2016-05-28 ENCOUNTER — Ambulatory Visit (HOSPITAL_COMMUNITY)
Admission: EM | Admit: 2016-05-28 | Discharge: 2016-05-28 | Disposition: A | Discharge status: Home / Self Care | Payer: Medicare Other | Attending: Family Medicine | Admitting: Family Medicine

## 2016-05-28 DIAGNOSIS — R6889 Other general symptoms and signs: Secondary | ICD-10-CM | POA: Diagnosis not present

## 2016-05-28 DIAGNOSIS — J9801 Acute bronchospasm: Secondary | ICD-10-CM

## 2016-05-28 DIAGNOSIS — B349 Viral infection, unspecified: Secondary | ICD-10-CM | POA: Diagnosis not present

## 2016-05-28 MED ORDER — PREDNISONE 20 MG PO TABS: ORAL_TABLET | ORAL | 0 refills | Status: DC

## 2016-05-28 MED ORDER — DEXAMETHASONE SODIUM PHOSPHATE 10 MG/ML IJ SOLN
INTRAMUSCULAR | Status: AC
  Filled 2016-05-28: qty 1

## 2016-05-28 MED ORDER — IPRATROPIUM-ALBUTEROL 0.5-2.5 (3) MG/3ML IN SOLN
RESPIRATORY_TRACT | Status: AC
  Filled 2016-05-28: qty 3

## 2016-05-28 MED ORDER — IPRATROPIUM-ALBUTEROL 0.5-2.5 (3) MG/3ML IN SOLN
3.0000 mL | Freq: Once | RESPIRATORY_TRACT | Status: AC
  Administered 2016-05-28: 3 mL via RESPIRATORY_TRACT

## 2016-05-28 MED ORDER — DEXAMETHASONE SODIUM PHOSPHATE 10 MG/ML IJ SOLN
10.0000 mg | Freq: Once | INTRAMUSCULAR | Status: AC
Start: 2016-05-28 — End: 2016-05-28
  Administered 2016-05-28: 10 mg via INTRAMUSCULAR

## 2016-05-28 NOTE — ED Triage Notes (Signed)
Pt reports a headache, bilateral ear aches, fatigue, body aches and a fever at home of 104.

## 2016-05-28 NOTE — Discharge Instructions (Signed)
Your chest x-ray is clear of any signs of bacterial infection. It appears to have the flu or flulike illness. You are given a prescription for prednisone to take the cause of your bronchospasm. Also use your albuterol inhaler 2 puffs every 4 hours as needed for breathing and coughing. Plenty of rest and drink plenty of fluids, Tylenol every 4 hours for fever and discomfort. For any drainage, runny nose Zyrtec or Allegra. Saline nasal spray frequently. For any worsening, new symptoms or problems, higher fevers, more trouble breathing, increased cough or other problems he may need to go to the emergency department or call 911.

## 2016-05-28 NOTE — ED Provider Notes (Signed)
CSN: [id number]     Arrival date & time 05/28/16  1452 History   First MD Initiated Contact with Patient 05/28/16 1632     Chief Complaint  Patient presents with  . Influenza   (Consider location/radiation/quality/duration/timing/severity/associated sxs/prior Treatment) 66 year old obese female with type 2 diabetes mellitus, bipolar depression, OSA, history of pneumonia and 45-pack-year smoker who stopped approximately 5 months ago presents with a complaint of "I either have pneumonia or the flu". She states she has had fevers at home as high as 104. She has earaches, myalgias, headache, shortness of breath, "the feeling "like my skin is about to be peeled off", abdominal discomfort, generalized achiness. Denies nausea or vomiting. Current apical pulse 104. Sats 99% temp 100.3.      Past Medical History:  Diagnosis Date  . Anxiety   . Arthritis    "knees, legs, back" (06/01/2013)  . Bipolar depression ([place])   . Chronic lower back pain   . High cholesterol   . OSA (obstructive sleep apnea)   . Pneumonia 1970's; 1980's; 06/01/2013   "total of 3 times"  . Type II diabetes mellitus ([place])    Past Surgical History:  Procedure Laterality Date  . ABDOMINAL HYSTERECTOMY  1982  . BACK SURGERY    . CHOLECYSTECTOMY  1990's  . FOREARM FRACTURE SURGERY Left 1999   "put in plates"  . IMPLANTATION VAGAL NERVE STIMULATOR  2013  . POSTERIOR LUMBAR FUSION  2000's  . SHOULDER OPEN ROTATOR CUFF REPAIR Right 2000  . TUBAL LIGATION  1978  . VAGAL NERVE STIMULATOR REMOVAL  2013   "got an infection in there" (06/01/2013)   Family History  Problem Relation Age of Onset  . Kidney failure Mother    Social History  Substance Use Topics  . Smoking status: Former Smoker    Packs/day: 0.50    Years: 45.00    Types: Cigarettes    Quit date: 01/27/2016  . Smokeless tobacco: Never Used     Comment: h/o 1 PPD  . Alcohol use Yes     Comment: 06/01/2013 "glass of wine maybe q 6 months, if that"   OB  History    No data available     Review of Systems  Constitutional: Positive for activity change, fatigue and fever. Negative for appetite change and chills.  HENT: Positive for congestion, postnasal drip and rhinorrhea. Negative for facial swelling and trouble swallowing.   Eyes: Negative.   Respiratory: Positive for cough and shortness of breath.   Cardiovascular: Negative.   Gastrointestinal: Negative for nausea and vomiting.  Genitourinary: Negative.   Musculoskeletal: Negative for neck pain and neck stiffness.  Skin: Negative for pallor and rash.  Neurological: Positive for headaches.  All other systems reviewed and are negative.   Allergies  Latex and Naproxen  Home Medications   Prior to Admission medications   Medication Sig Start Date End Date Taking? Authorizing Provider  albuterol (PROVENTIL HFA;VENTOLIN HFA) 108 (90 BASE) MCG/ACT inhaler Inhale 2 puffs into the lungs every 6 (six) hours as needed for wheezing or shortness of breath. 06/03/13  Yes Belkys A Regalado, MD  diphenhydrAMINE (BENADRYL) 25 mg capsule Take 1 capsule (25 mg total) by mouth every 8 (eight) hours as needed for itching or allergies. 06/03/13  Yes Belkys A Regalado, MD  fluticasone (FLONASE) 50 MCG/ACT nasal spray Place 2 sprays into both nostrils daily. 06/03/13  Yes Belkys A Regalado, MD  furosemide (LASIX) 20 MG tablet Take 1 tablet by mouth daily.  Yes Historical Provider, MD  potassium chloride SA (K-DUR,KLOR-CON) 20 MEQ tablet Take 20 mEq by mouth daily.   Yes Historical Provider, MD  pregabalin (LYRICA) 75 MG capsule Take 75 mg by mouth 3 (three) times daily.   Yes Historical Provider, MD  simvastatin (ZOCOR) 10 MG tablet Take 1 tablet by mouth daily. 05/29/13  Yes Historical Provider, MD  topiramate (TOPAMAX) 25 MG tablet Take 25 mg by mouth daily.   Yes Historical Provider, MD  traZODone (DESYREL) 50 MG tablet Take 1 tablet by mouth at bedtime.  05/23/13  Yes Historical Provider, MD  VICTOZA 18  MG/3ML SOPN Inject 18 mg into the skin daily.  07/29/14  Yes Historical Provider, MD  HYDROcodone-acetaminophen (NORCO) 5-325 MG per tablet Take 1 tablet by mouth every 4 (four) hours as needed. Patient taking differently: Take 1 tablet by mouth every 4 (four) hours as needed for moderate pain.  04/02/14   Daleen Bo, MD  predniSONE (DELTASONE) 20 MG tablet Take 3 tabs po on first day, 2 tabs second day, 2 tabs third day, 1 tab fourth day, 1 tab 5th day. Take with food. Start 05/29/2016. 05/28/16   Janne Napoleon, NP   Meds Ordered and Administered this Visit   Medications  ipratropium-albuterol (DUONEB) 0.5-2.5 (3) MG/3ML nebulizer solution 3 mL (3 mLs Nebulization Given 05/28/16 1720)  dexamethasone (DECADRON) injection 10 mg (10 mg Intramuscular Given 05/28/16 1718)    BP 107/68 (BP Location: Left Arm)   Pulse 100   Temp 100.3 F (37.9 C) (Oral)   SpO2 99%  No data found.   Physical Exam  Constitutional: She is oriented to person, place, and time. She appears well-developed and well-nourished. No distress.  HENT:  Head: Normocephalic and atraumatic.  Mouth/Throat: No oropharyngeal exudate.  Oropharynx with minor erythema and clear PND otherwise normal. Bilateral TMs are normal.  Eyes: EOM are normal.  Neck: Normal range of motion. Neck supple.  Cardiovascular: Regular rhythm and intact distal pulses.   No murmur heard. Borderline tachycardia  Pulmonary/Chest: She has wheezes.  Diminished breath sounds bilaterally. With cough there is bilateral diffuse coarseness.  Musculoskeletal: Normal range of motion.  Lymphadenopathy:    She has no cervical adenopathy.  Neurological: She is alert and oriented to person, place, and time.  Skin: Skin is warm.  Psychiatric: She has a normal mood and affect.  Nursing note and vitals reviewed.   Urgent Care Course     Procedures (including critical care time)  Labs Review Labs Reviewed - No data to display  Imaging Review Dg Chest 2  View  Result Date: 05/28/2016 CLINICAL DATA:  Cough, shortness of breath, and flu-like symptoms for 3 days. Former smoker. EXAM: CHEST  2 VIEW COMPARISON:  Chest radiographs 01/04/2016 and CTA 01/05/2016 FINDINGS: The cardiomediastinal silhouette is within normal limits. Right upper lobe consolidation on the prior radiographs has resolved. There is minimal atelectasis in the lung bases without evidence of new confluent airspace opacity, overt pulmonary edema, pleural effusion, or pneumothorax. Thoracic disc degeneration and upper abdominal surgical clips are noted. IMPRESSION: Minimal bibasilar atelectasis. Electronically Signed   By: Logan Bores M.D.   On: 05/28/2016 17:18     Visual Acuity Review  Right Eye Distance:   Left Eye Distance:   Bilateral Distance:    Right Eye Near:   Left Eye Near:    Bilateral Near:         MDM   1. Flu-like symptoms   2. Viral syndrome  3. Bronchospasm   Post DuoNeb lungs are moving air a little better. Breath sounds are still diminished but no wheezing. Patient states she feels as though she is breathing better. Your chest x-ray is clear of any signs of bacterial infection. It appears to have the flu or flulike illness. You are given a prescription for prednisone to take the cause of your bronchospasm. Also use your albuterol inhaler 2 puffs every 4 hours as needed for breathing and coughing. Plenty of rest and drink plenty of fluids, Tylenol every 4 hours for fever and discomfort. For any drainage, runny nose Zyrtec or Allegra. Saline nasal spray frequently. For any worsening, new symptoms or problems, higher fevers, more trouble breathing, increased cough or other problems he may need to go to the emergency department or call 911. Meds ordered this encounter  Medications  . ipratropium-albuterol (DUONEB) 0.5-2.5 (3) MG/3ML nebulizer solution 3 mL  . dexamethasone (DECADRON) injection 10 mg  . predniSONE (DELTASONE) 20 MG tablet    Sig: Take 3  tabs po on first day, 2 tabs second day, 2 tabs third day, 1 tab fourth day, 1 tab 5th day. Take with food. Start 05/29/2016.    Dispense:  9 tablet    Refill:  0    Order Specific Question:   Supervising Provider    Answer:   Billy Fischer [[id number]]        Janne Napoleon, NP 05/28/16 1745

## 2016-10-13 ENCOUNTER — Encounter (HOSPITAL_COMMUNITY): Payer: Self-pay | Admitting: Emergency Medicine

## 2016-10-13 ENCOUNTER — Ambulatory Visit (HOSPITAL_COMMUNITY)
Admission: EM | Admit: 2016-10-13 | Discharge: 2016-10-13 | Disposition: A | Discharge status: Home / Self Care | Payer: Medicare Other | Attending: Internal Medicine | Admitting: Internal Medicine

## 2016-10-13 DIAGNOSIS — Z91040 Latex allergy status: Secondary | ICD-10-CM | POA: Insufficient documentation

## 2016-10-13 DIAGNOSIS — F419 Anxiety disorder, unspecified: Secondary | ICD-10-CM | POA: Diagnosis not present

## 2016-10-13 DIAGNOSIS — E7800 Pure hypercholesterolemia, unspecified: Secondary | ICD-10-CM | POA: Diagnosis not present

## 2016-10-13 DIAGNOSIS — Z87891 Personal history of nicotine dependence: Secondary | ICD-10-CM | POA: Diagnosis not present

## 2016-10-13 DIAGNOSIS — Z79891 Long term (current) use of opiate analgesic: Secondary | ICD-10-CM | POA: Diagnosis not present

## 2016-10-13 DIAGNOSIS — E119 Type 2 diabetes mellitus without complications: Secondary | ICD-10-CM | POA: Insufficient documentation

## 2016-10-13 DIAGNOSIS — G4733 Obstructive sleep apnea (adult) (pediatric): Secondary | ICD-10-CM | POA: Insufficient documentation

## 2016-10-13 DIAGNOSIS — G8929 Other chronic pain: Secondary | ICD-10-CM | POA: Insufficient documentation

## 2016-10-13 DIAGNOSIS — F319 Bipolar disorder, unspecified: Secondary | ICD-10-CM | POA: Diagnosis not present

## 2016-10-13 DIAGNOSIS — M545 Low back pain: Secondary | ICD-10-CM | POA: Diagnosis not present

## 2016-10-13 DIAGNOSIS — Z9049 Acquired absence of other specified parts of digestive tract: Secondary | ICD-10-CM | POA: Diagnosis not present

## 2016-10-13 DIAGNOSIS — E785 Hyperlipidemia, unspecified: Secondary | ICD-10-CM | POA: Insufficient documentation

## 2016-10-13 DIAGNOSIS — R109 Unspecified abdominal pain: Secondary | ICD-10-CM | POA: Diagnosis present

## 2016-10-13 LAB — POCT URINALYSIS DIP (DEVICE)
BILIRUBIN URINE: NEGATIVE
GLUCOSE, UA: NEGATIVE mg/dL
Hgb urine dipstick: NEGATIVE
KETONES UR: NEGATIVE mg/dL
Leukocytes, UA: NEGATIVE
Nitrite: NEGATIVE
Protein, ur: NEGATIVE mg/dL
SPECIFIC GRAVITY, URINE: 1.02 (ref 1.005–1.030)
Urobilinogen, UA: 1 mg/dL (ref 0.0–1.0)
pH: 6.5 (ref 5.0–8.0)

## 2016-10-13 NOTE — ED Provider Notes (Signed)
10/13/2016 3:07 PM   DOB: [birth year] / MRN: [id number]  SUBJECTIVE:  Melanie Reynolds is a 66 y.o. female status post cholecystectomy, abdominal hysterectomy and tubal ligation with a pmh of diabetes and dyslipidemia  presenting for sharp flank pain on the right side that started yesterday.  This started after bending over.  The pain seemed to get better as the day went on and tells me the pain was worse this morning.  Denies hematuria or dysuria. She does associate urinary frequency and urgency.  Bending over makes the pain worse. Denies abnormal vaginal discharge today. Denies fever. Has a good appetite.  Feels that she is getting worse.  She does take chronic narcotics for a history of chronic back pain. She is moving her bowels normally with the last bowel movement being yesterday after the injury and she says this was non bloody.   She is allergic to latex and naproxen.   She  has a past medical history of Anxiety; Arthritis; Bipolar depression ([place]); Chronic lower back pain; High cholesterol; OSA (obstructive sleep apnea); Pneumonia (1970's; 1980's; 06/01/2013); and Type II diabetes mellitus ([place]).    She  reports that she quit smoking about 8 months ago. Her smoking use included Cigarettes. She has a 22.50 pack-year smoking history. She has never used smokeless tobacco. She reports that she drinks alcohol. She reports that she uses drugs, including Marijuana. She  reports that she does not currently engage in sexual activity. The patient  has a past surgical history that includes Cholecystectomy (1990's); Abdominal hysterectomy (1982); Tubal ligation (1978); Back surgery; Posterior lumbar fusion (2000's); Implantation vagal nerve stimulator (2013); Vagal nerve stimulator removal (2013); Forearm fracture surgery (Left, 1999); and Shoulder open rotator cuff repair (Right, 2000).  Her family history includes Kidney failure in her mother.  Review of Systems  Constitutional: Negative for chills and  fever.  Respiratory: Negative for cough.   Cardiovascular: Negative for chest pain.  Gastrointestinal: Negative for abdominal pain, nausea and vomiting.  Musculoskeletal: Positive for back pain and myalgias. Negative for falls, joint pain and neck pain.  Skin: Negative for rash.  Neurological: Negative for dizziness.    The problem list and medications were reviewed and updated by myself where necessary and exist elsewhere in the encounter.   OBJECTIVE:  BP 98/86 (BP Location: Left Arm)   Pulse 70   Temp 98.4 F (36.9 C) (Oral)   Resp 20   SpO2 100%   BP Readings from Last 3 Encounters:  10/13/16 98/86  05/28/16 107/68  01/09/16 135/80   Wt Readings from Last 3 Encounters:  01/08/16 236 lb 8.9 oz (107.3 kg)  08/29/15 227 lb (103 kg)  08/23/14 226 lb (102.5 kg)     Physical Exam  Constitutional: She is oriented to person, place, and time.  Cardiovascular: Normal rate, regular rhythm and normal heart sounds.  Exam reveals no gallop and no friction rub.   No murmur heard. Pulmonary/Chest: Effort normal and breath sounds normal.  Musculoskeletal: Normal range of motion.  Neurological: She is alert and oriented to person, place, and time.  Skin: Skin is warm.  Vitals reviewed.   Results for orders placed or performed during the hospital encounter of 10/13/16 (from the past 72 hour(s))  POCT urinalysis dip (device)     Status: None   Collection Time: 10/13/16  3:00 PM  Result Value Ref Range   Glucose, UA NEGATIVE NEGATIVE mg/dL   Bilirubin Urine NEGATIVE NEGATIVE   Ketones, ur NEGATIVE NEGATIVE  mg/dL   Specific Gravity, Urine 1.020 1.005 - 1.030   Hgb urine dipstick NEGATIVE NEGATIVE   pH 6.5 5.0 - 8.0   Protein, ur NEGATIVE NEGATIVE mg/dL   Urobilinogen, UA 1.0 0.0 - 1.0 mg/dL   Nitrite NEGATIVE NEGATIVE   Leukocytes, UA NEGATIVE NEGATIVE    Comment: Biochemical Testing Only. Please order routine urinalysis from main lab if confirmatory testing is needed.   Lab  Results  Component Value Date   HGBA1C 6.2 (H) 01/04/2016   Lab Results  Component Value Date   CREATININE 0.65 01/09/2016   BUN 6 01/09/2016   NA 140 01/09/2016   K 3.3 (L) 01/09/2016   CL 111 01/09/2016   CO2 21 (L) 01/09/2016   Lab Results  Component Value Date   ALT 29 01/05/2016   AST 26 01/05/2016   ALKPHOS 59 01/05/2016   BILITOT 1.6 (H) 01/05/2016      ASSESSMENT AND PLAN:  Chronic back pain: Acute on chronic and this started with bending over.  No flank pain on exam. Urine is normal. She has no symptoms of a UTI.  Will culture to be sure.  Follow up with PCP.      The patient is advised to call or return to clinic if she does not see an improvement in symptoms, or to seek the care of the closest emergency department if she worsens with the above plan.   Philis Fendt, MHS, PA-C [facility] 10/13/2016 3:07 PM    Tereasa Coop, PA-C 10/13/16 [phone]

## 2016-10-13 NOTE — ED Triage Notes (Signed)
The patient presented to the [facility] with a complaint of lower back pain and some lower abdominal pain that started yesterday. The patient reported urinary frequency.

## 2016-10-13 NOTE — Discharge Instructions (Signed)
Can take tylenol 1000 mg every 8 hours in addition to chronic pain meds.

## 2016-10-14 LAB — URINE CULTURE: Culture: NO GROWTH

## 2017-01-16 ENCOUNTER — Ambulatory Visit (INDEPENDENT_AMBULATORY_CARE_PROVIDER_SITE_OTHER): Payer: Medicare Other | Admitting: Acute Care

## 2017-01-16 ENCOUNTER — Encounter: Payer: Self-pay | Admitting: Acute Care

## 2017-01-16 DIAGNOSIS — R0609 Other forms of dyspnea: Secondary | ICD-10-CM

## 2017-01-16 DIAGNOSIS — J309 Allergic rhinitis, unspecified: Secondary | ICD-10-CM | POA: Diagnosis not present

## 2017-01-16 DIAGNOSIS — IMO0001 Reserved for inherently not codable concepts without codable children: Secondary | ICD-10-CM

## 2017-01-16 DIAGNOSIS — E669 Obesity, unspecified: Secondary | ICD-10-CM

## 2017-01-16 DIAGNOSIS — G4733 Obstructive sleep apnea (adult) (pediatric): Secondary | ICD-10-CM

## 2017-01-16 NOTE — Assessment & Plan Note (Signed)
Obese Weight is down 5 pounds in the last year Plan Continue to work on weight loss

## 2017-01-16 NOTE — Progress Notes (Signed)
History of Present Illness Melanie Reynolds is a 66 y.o. female with OSA.    01/16/2017 Follow Up for OSA Pt. Presents for follow up. She has paperwork with her from the [facility] that needs to be completed. She did not however bring the medical form. She states this is the only paperwork she received from the [facility], and that she will research why she did not receive the medical form. She states she is doing well with her CPAP when she wears it. Download today reveals that she is only wearing her CPAP 30% of the time. Additionally she is only wearing it for short periods of time. She is not meeting the 4 hour marker. She states she has had  sinus problems and that is the reason she is not wearing her CPAP. She states that when she wears it she gets better sleep and is more rested. She continues to complain of some exertional dyspnea. She is using albuterol inhaler when necessary. We have discussed letting us know if she would like to consider maintenance therapy for her likely COPD.Patient denies fever, chest pain, orthopnea, or hemoptysis.  Test Results: Download over the last 30 days revealed 30% use of CPAP. It revealed average time using CPAP 3 hours. AHI was 5.3.  NPSG 05/2013:  AHI 23/hr  Got cpap in 2015.  On CPAP 10 cm H2O.  CBC Latest Ref Rng & Units 01/09/2016 01/07/2016 01/06/2016  WBC 4.0 - 10.5 K/uL 9.1 12.3(H) 17.7(H)  Hemoglobin 12.0 - 15.0 g/dL 11.0(L) 11.7(L) 12.4  Hematocrit 36.0 - 46.0 % 34.2(L) 37.1 39.1  Platelets 150 - 400 K/uL 305 272 277    BMP Latest Ref Rng & Units 01/09/2016 01/07/2016 01/06/2016  Glucose 65 - 99 mg/dL 141(H) 154(H) 80  BUN 6 - 20 mg/dL 6 6 8   Creatinine 0.44 - 1.00 mg/dL 0.65 0.81 0.95  Sodium 135 - 145 mmol/L 140 136 140  Potassium 3.5 - 5.1 mmol/L 3.3(L) 3.6 4.3  Chloride 101 - 111 mmol/L 111 110 112(H)  CO2 22 - 32 mmol/L 21(L) 16(L) 18(L)  Calcium 8.9 - 10.3 mg/dL 8.5(L) 8.3(L) 8.1(L)     ProBNP    Component Value Date/Time   PROBNP 29.4 06/01/2013  0212      Past medical hx Past Medical History:  Diagnosis Date  . Anxiety   . Arthritis    "knees, legs, back" (06/01/2013)  . Bipolar depression ([place])   . Chronic lower back pain   . High cholesterol   . OSA (obstructive sleep apnea)   . Pneumonia 1970's; 1980's; 06/01/2013   "total of 3 times"  . Type II diabetes mellitus ([place])      Social History  Substance Use Topics  . Smoking status: Former Smoker    Packs/day: 0.50    Years: 45.00    Types: Cigarettes    Quit date: 01/27/2016  . Smokeless tobacco: Never Used     Comment: h/o 1 PPD  . Alcohol use Yes     Comment: 06/01/2013 "glass of wine maybe q 6 months, if that"    Ms.Johannsen reports that she quit smoking about a year ago. Her smoking use included Cigarettes. She has a 22.50 pack-year smoking history. She has never used smokeless tobacco. She reports that she drinks alcohol. She reports that she uses drugs, including Marijuana.  Tobacco Cessation: Former smoker with a 23-pack-year smoking history quit 01/27/2016  Past surgical hx, Family hx, Social hx all reviewed.  Current Outpatient Prescriptions on File  Prior to Visit  Medication Sig  . albuterol (PROVENTIL HFA;VENTOLIN HFA) 108 (90 BASE) MCG/ACT inhaler Inhale 2 puffs into the lungs every 6 (six) hours as needed for wheezing or shortness of breath.  . diphenhydrAMINE (BENADRYL) 25 mg capsule Take 1 capsule (25 mg total) by mouth every 8 (eight) hours as needed for itching or allergies.  . fluticasone (FLONASE) 50 MCG/ACT nasal spray Place 2 sprays into both nostrils daily.  . furosemide (LASIX) 20 MG tablet Take 1 tablet by mouth daily.  Marland Kitchen HYDROcodone-acetaminophen (NORCO) 5-325 MG per tablet Take 1 tablet by mouth every 4 (four) hours as needed. (Patient taking differently: Take 1 tablet by mouth every 4 (four) hours as needed for moderate pain. )  . potassium chloride SA (K-DUR,KLOR-CON) 20 MEQ tablet Take 20 mEq by mouth daily.  . predniSONE (DELTASONE) 20 MG  tablet Take 3 tabs po on first day, 2 tabs second day, 2 tabs third day, 1 tab fourth day, 1 tab 5th day. Take with food. Start 05/29/2016.  . pregabalin (LYRICA) 75 MG capsule Take 75 mg by mouth 3 (three) times daily.  . simvastatin (ZOCOR) 10 MG tablet Take 1 tablet by mouth daily.  Marland Kitchen topiramate (TOPAMAX) 25 MG tablet Take 25 mg by mouth daily.  . traZODone (DESYREL) 50 MG tablet Take 1 tablet by mouth at bedtime.   Marland Kitchen VICTOZA 18 MG/3ML SOPN Inject 18 mg into the skin daily.    No current facility-administered medications on file prior to visit.      Allergies  Allergen Reactions  . Latex Itching  . Naproxen Other (See Comments)    Abdominal pain    Review Of Systems:  Constitutional:   No  weight loss, night sweats,  Fevers, chills, fatigue, or  lassitude.  HEENT:   No headaches,  Difficulty swallowing,  Tooth/dental problems, or  Sore throat,                No sneezing, itching, ear ache, +nasal congestion,+ post nasal drip,   CV:  No chest pain,  Orthopnea, PND, swelling in lower extremities, anasarca, dizziness, palpitations, syncope.   GI  No heartburn, indigestion, abdominal pain, nausea, vomiting, diarrhea, change in bowel habits, loss of appetite, bloody stools.   Resp: No shortness of breath with exertion or at rest.  No excess mucus, no productive cough,  No non-productive cough,  No coughing up of blood.  No change in color of mucus.  No wheezing.  No chest wall deformity  Skin: no rash or lesions.  GU: no dysuria, change in color of urine, no urgency or frequency.  No flank pain, no hematuria   MS:  No joint pain or swelling.  No decreased range of motion.  No back pain.  Psych:  No change in mood or affect. No depression or anxiety.  No memory loss.   Vital Signs BP 110/70 (BP Location: Left Arm, Cuff Size: Normal)   Pulse 86   Ht 5\' 3"  (1.6 m)   Wt 222 lb (100.7 kg)   SpO2 97%   BMI 39.33 kg/m    Physical Exam:  General- No distress,  A&Ox3, very  pleasant ENT: No sinus tenderness, TM clear, pale nasal mucosa, no oral exudate,+ post nasal drip, no LAN Cardiac: S1, S2, regular rate and rhythm, no murmur Chest: No wheeze/ rales/ dullness; no accessory muscle use, no nasal flaring, no sternal retractions Abd.: Soft Non-tender, obese Ext: No clubbing cyanosis, edema Neuro:  normal strength, cranial nerves  intact, alert and oriented 3 mood and affect are normal Skin: No rashes, warm and dry, no lesions Psych: normal mood and behavior   Assessment/Plan  OSA (obstructive sleep apnea) Poor compliance with CPAP therapy for OSA Needs paperwork completed for DMV but did not bring it in today Plan It is nice to meet you today. Please continue to work on your CPAP compliance. Please continue to wear your CPAP at least 4-6 hours per night. Continue on CPAP at bedtime. You appear to be benefiting from the treatment Goal is to wear for at least 6 hours each night for maximal clinical benefit. Continue to work on weight loss, as the link between excess weight  and sleep apnea is well established.  Do not drive if sleepy. Remember to clean mask, tubing, filter, and reservoir once weekly with soapy water.   Please bring in the paperwork required by the [facility] for [place] to complete. You will need to show improved compliance with CPAP therapy.   Exertional dyspnea Continued shortness of breath with exertion Plan Continue when necessary albuterol Let us know if you shortness of breath becomes worse, we will do additional diagnostics  Obesity Obese Weight is down 5 pounds in the last year Plan Continue to work on weight loss  Allergic rhinitis Use flonase for sinus congestion, 1 squirt in each nostril daily Consider saline rinses. Consider using claritin 10 mg once daily ( Loratadine) Follow up in 1 month with Judson Roch NP or Dr. Annamaria Boots  Or call  before as needed.  Please contact office for sooner follow up if symptoms do not improve or worsen  or seek emergency care      Magdalen Spatz, NP 01/16/2017  4:23 PM

## 2017-01-16 NOTE — Assessment & Plan Note (Signed)
Poor compliance with CPAP therapy for OSA Needs paperwork completed for DMV but did not bring it in today Plan It is nice to meet you today. Please continue to work on your CPAP compliance. Please continue to wear your CPAP at least 4-6 hours per night. Continue on CPAP at bedtime. You appear to be benefiting from the treatment Goal is to wear for at least 6 hours each night for maximal clinical benefit. Continue to work on weight loss, as the link between excess weight  and sleep apnea is well established.  Do not drive if sleepy. Remember to clean mask, tubing, filter, and reservoir once weekly with soapy water.   Please bring in the paperwork required by the [facility] for [place] to complete. You will need to show improved compliance with CPAP therapy.

## 2017-01-16 NOTE — Assessment & Plan Note (Signed)
Use flonase for sinus congestion, 1 squirt in each nostril daily Consider saline rinses. Consider using claritin 10 mg once daily ( Loratadine) Follow up in 1 month with Judson Roch NP or Dr. Annamaria Boots  Or call  before as needed.  Please contact office for sooner follow up if symptoms do not improve or worsen or seek emergency care

## 2017-01-16 NOTE — Patient Instructions (Addendum)
It is nice to meet you today. Please continue to work on your CPAP compliance. Please continue to wear your CPAP at least 4-6 hours per night. Continue on CPAP at bedtime. You appear to be benefiting from the treatment Goal is to wear for at least 6 hours each night for maximal clinical benefit. Continue to work on weight loss, as the link between excess weight  and sleep apnea is well established.  Do not drive if sleepy. Remember to clean mask, tubing, filter, and reservoir once weekly with soapy water.   Please bring in the paperwork required by the [facility] for [place] to complete. You will need to show improved compliance with CPAP therapy.  Use flonase for sinus congestion, 1 squirt in each nostril daily Consider saline rinses. Consider using claritin 10 mg once daily ( Loratadine) Follow up in 1 month with Judson Roch NP or Dr. Annamaria Boots  Or call  before as needed.  Please contact office for sooner follow up if symptoms do not improve or worsen or seek emergency care

## 2017-01-16 NOTE — Assessment & Plan Note (Signed)
Continued shortness of breath with exertion Plan Continue when necessary albuterol Let us know if you shortness of breath becomes worse, we will do additional diagnostics

## 2017-01-17 ENCOUNTER — Other Ambulatory Visit: Payer: Self-pay | Admitting: Internal Medicine

## 2017-01-17 DIAGNOSIS — Z1231 Encounter for screening mammogram for malignant neoplasm of breast: Secondary | ICD-10-CM

## 2017-01-17 DIAGNOSIS — E2839 Other primary ovarian failure: Secondary | ICD-10-CM

## 2017-02-17 ENCOUNTER — Encounter: Payer: Self-pay | Admitting: Acute Care

## 2017-02-17 ENCOUNTER — Ambulatory Visit (INDEPENDENT_AMBULATORY_CARE_PROVIDER_SITE_OTHER): Payer: Medicare Other | Admitting: Acute Care

## 2017-02-17 DIAGNOSIS — G4733 Obstructive sleep apnea (adult) (pediatric): Secondary | ICD-10-CM

## 2017-02-17 NOTE — Progress Notes (Signed)
History of Present Illness Melanie Reynolds is a 66 y.o. female former smoker with OSA on CAP. She is followed by Dr. Annamaria Boots.   02/17/2017 Pt. Presents for follow up. She returns for compliance check required by [facility]. She presented last month, and only had compliance of 30% at that time. She states she has been compliant with her CPAP. She states she is more alert and focused during the day, and she is sleeping well at night. She states she has no drowsiness while driving.She knows she needs to sent this paperwork into the [facility]. Her compliance is 80%. She denies any fever, chest pain, orthopnea or hemoptysis.  Test Results: DownLoad of CPAP Compliance CPAP set pressure of 10 cm H2O. Compliance : 24/30 days, 80% > 4 hours 20 days ( 67%) < 4 hours was 4 days ( 13%) Average usage is 4 hours 34 minutes Median pressure is 7.2 cm H2O AHI is 3.   CBC Latest Ref Rng & Units 01/09/2016 01/07/2016 01/06/2016  WBC 4.0 - 10.5 K/uL 9.1 12.3(H) 17.7(H)  Hemoglobin 12.0 - 15.0 g/dL 11.0(L) 11.7(L) 12.4  Hematocrit 36.0 - 46.0 % 34.2(L) 37.1 39.1  Platelets 150 - 400 K/uL 305 272 277    BMP Latest Ref Rng & Units 01/09/2016 01/07/2016 01/06/2016  Glucose 65 - 99 mg/dL 141(H) 154(H) 80  BUN 6 - 20 mg/dL 6 6 8   Creatinine 0.44 - 1.00 mg/dL 0.65 0.81 0.95  Sodium 135 - 145 mmol/L 140 136 140  Potassium 3.5 - 5.1 mmol/L 3.3(L) 3.6 4.3  Chloride 101 - 111 mmol/L 111 110 112(H)  CO2 22 - 32 mmol/L 21(L) 16(L) 18(L)  Calcium 8.9 - 10.3 mg/dL 8.5(L) 8.3(L) 8.1(L)     ProBNP    Component Value Date/Time   PROBNP 29.4 06/01/2013 0212     Past medical hx Past Medical History:  Diagnosis Date  . Anxiety   . Arthritis    "knees, legs, back" (06/01/2013)  . Bipolar depression ([place])   . Chronic lower back pain   . High cholesterol   . OSA (obstructive sleep apnea)   . Pneumonia 1970's; 1980's; 06/01/2013   "total of 3 times"  . Type II diabetes mellitus ([place])      Social History  Substance Use Topics    . Smoking status: Former Smoker    Packs/day: 0.50    Years: 45.00    Types: Cigarettes    Quit date: 01/27/2016  . Smokeless tobacco: Never Used     Comment: h/o 1 PPD  . Alcohol use Yes     Comment: 06/01/2013 "glass of wine maybe q 6 months, if that"    [name] reports that she quit smoking about 12 months ago. Her smoking use included Cigarettes. She has a 22.50 pack-year smoking history. She has never used smokeless tobacco. She reports that she drinks alcohol. She reports that she uses drugs, including Marijuana.  Tobacco Cessation: Former smoker quit 2017, 22.5 pack year smoker  Past surgical hx, Family hx, Social hx all reviewed.  Current Outpatient Prescriptions on File Prior to Visit  Medication Sig  . albuterol (PROVENTIL HFA;VENTOLIN HFA) 108 (90 BASE) MCG/ACT inhaler Inhale 2 puffs into the lungs every 6 (six) hours as needed for wheezing or shortness of breath.  . diphenhydrAMINE (BENADRYL) 25 mg capsule Take 1 capsule (25 mg total) by mouth every 8 (eight) hours as needed for itching or allergies.  . fluticasone (FLONASE) 50 MCG/ACT nasal spray Place 2 sprays into both nostrils  daily.  . furosemide (LASIX) 20 MG tablet Take 1 tablet by mouth daily.  Marland Kitchen HYDROcodone-acetaminophen (NORCO) 5-325 MG per tablet Take 1 tablet by mouth every 4 (four) hours as needed. (Patient taking differently: Take 1 tablet by mouth every 4 (four) hours as needed for moderate pain. )  . potassium chloride SA (K-DUR,KLOR-CON) 20 MEQ tablet Take 20 mEq by mouth daily.  . pregabalin (LYRICA) 100 MG capsule Take 100 mg by mouth 3 (three) times daily.   . simvastatin (ZOCOR) 10 MG tablet Take 1 tablet by mouth daily.  Marland Kitchen topiramate (TOPAMAX) 50 MG tablet Take 50 mg by mouth daily.   . traZODone (DESYREL) 50 MG tablet Take 1 tablet by mouth at bedtime as needed.   Marland Kitchen VICTOZA 18 MG/3ML SOPN Inject 18 mg into the skin daily.    No current facility-administered medications on file prior to visit.       Allergies  Allergen Reactions  . Latex Itching  . Naproxen Other (See Comments)    Abdominal pain    Review Of Systems:  Constitutional:   No  weight loss, night sweats,  Fevers, chills, fatigue, or  lassitude.  HEENT:   No headaches,  Difficulty swallowing,  Tooth/dental problems, or  Sore throat,                No sneezing, itching, ear ache, nasal congestion, post nasal drip,   CV:  No chest pain,  Orthopnea, PND, swelling in lower extremities, anasarca, dizziness, palpitations, syncope.   GI  No heartburn, indigestion, abdominal pain, nausea, vomiting, diarrhea, change in bowel habits, loss of appetite, bloody stools.   Resp: No shortness of breath with exertion or at rest.  No excess mucus, no productive cough,  No non-productive cough,  No coughing up of blood.  No change in color of mucus.  No wheezing.  No chest wall deformity  Skin: no rash or lesions.  GU: no dysuria, change in color of urine, no urgency or frequency.  No flank pain, no hematuria   MS:  No joint pain or swelling.  No decreased range of motion.  No back pain.  Psych:  No change in mood or affect. No depression or anxiety.  No memory loss.   Vital Signs BP 136/74 (BP Location: Left Arm, Cuff Size: Normal)   Pulse 90   Ht 5\' 3"  (1.6 m)   Wt 222 lb (100.7 kg)   SpO2 98%   BMI 39.33 kg/m    Physical Exam:  General- No distress,  A&Ox3, pleasant ENT: No sinus tenderness, TM clear, pale nasal mucosa, no oral exudate,no post nasal drip, no LAN Cardiac: S1, S2, regular rate and rhythm, no murmur Chest: No wheeze/ rales/ dullness; no accessory muscle use, no nasal flaring, no sternal retractions Abd.: Soft Non-tender, obese Ext: No clubbing cyanosis, edema Neuro:  normal strength Skin: No rashes, warm and dry Psych: normal mood and behavior   Assessment/Plan  OSA (obstructive sleep apnea) Compliant with therapy AHI is 3.0, direct benefit from therapy Plan: Continue on CPAP at bedtime. You  appear to be benefiting from the treatment Goal is to wear for at least 6 hours each night for maximal clinical benefit. Continue to work on weight loss, as the link between excess weight  and sleep apnea is well established.  Do not drive if sleepy. Remember to clean mask, tubing, filter, and reservoir once weekly with soapy water.  Follow up with Dr. Annamaria Boots in 12 months. Please contact office  for sooner follow up if symptoms do not improve or worsen or seek emergency care    Pt. Was given a copy of her Down Load. She is going to fax results to [facility] as she had no paperwork for [place] to complete in the office today.  Magdalen Spatz, NP 02/17/2017  9:25 AM

## 2017-02-17 NOTE — Patient Instructions (Signed)
It is good to see you today. Continue on CPAP at bedtime. You appear to be benefiting from the treatment Goal is to wear for at least 6 hours each night for maximal clinical benefit. Continue to work on weight loss, as the link between excess weight  and sleep apnea is well established.  Do not drive if sleepy. Remember to clean mask, tubing, filter, and reservoir once weekly with soapy water.  Follow up with Dr. Annamaria Boots in 12 months. Please contact office for sooner follow up if symptoms do not improve or worsen or seek emergency care

## 2017-02-17 NOTE — Assessment & Plan Note (Signed)
Compliant with therapy AHI is 3.0, direct benefit from therapy Plan: Continue on CPAP at bedtime. You appear to be benefiting from the treatment Goal is to wear for at least 6 hours each night for maximal clinical benefit. Continue to work on weight loss, as the link between excess weight  and sleep apnea is well established.  Do not drive if sleepy. Remember to clean mask, tubing, filter, and reservoir once weekly with soapy water.  Follow up with Dr. Annamaria Boots in 12 months. Please contact office for sooner follow up if symptoms do not improve or worsen or seek emergency care

## 2017-04-28 ENCOUNTER — Ambulatory Visit (INDEPENDENT_AMBULATORY_CARE_PROVIDER_SITE_OTHER): Payer: Medicare HMO

## 2017-04-28 ENCOUNTER — Ambulatory Visit (HOSPITAL_COMMUNITY)
Admission: EM | Admit: 2017-04-28 | Discharge: 2017-04-28 | Disposition: A | Discharge status: Home / Self Care | Payer: Medicare HMO | Attending: Family Medicine | Admitting: Family Medicine

## 2017-04-28 ENCOUNTER — Encounter (HOSPITAL_COMMUNITY): Payer: Self-pay | Admitting: Family Medicine

## 2017-04-28 DIAGNOSIS — R059 Cough, unspecified: Secondary | ICD-10-CM

## 2017-04-28 DIAGNOSIS — R509 Fever, unspecified: Secondary | ICD-10-CM | POA: Diagnosis not present

## 2017-04-28 DIAGNOSIS — R05 Cough: Secondary | ICD-10-CM

## 2017-04-28 DIAGNOSIS — J189 Pneumonia, unspecified organism: Secondary | ICD-10-CM

## 2017-04-28 MED ORDER — HYDROCODONE-HOMATROPINE 5-1.5 MG/5ML PO SYRP: 5.0000 mL | ORAL_SOLUTION | Freq: Four times a day (QID) | ORAL | 0 refills | Status: DC | PRN

## 2017-04-28 MED ORDER — AZITHROMYCIN 250 MG PO TABS: 250.0000 mg | ORAL_TABLET | Freq: Every day | ORAL | 0 refills | Status: DC

## 2017-04-28 NOTE — ED Triage Notes (Signed)
Pt here for fever, SOB, cough x a few days.

## 2017-04-28 NOTE — ED Provider Notes (Signed)
[name]   [id number] 04/28/17 Arrival Time: 6712  ASSESSMENT & PLAN:  1. Fever and chills   2. Cough   3. Pneumonia of both lungs due to infectious organism, unspecified part of lung     Meds ordered this encounter  Medications  . azithromycin (ZITHROMAX) 250 MG tablet    Sig: Take 1 tablet (250 mg total) by mouth daily. Take first 2 tablets together, then 1 every day until finished.    Dispense:  6 tablet    Refill:  0  . HYDROcodone-homatropine (HYCODAN) 5-1.5 MG/5ML syrup    Sig: Take 5 mLs by mouth every 6 (six) hours as needed for cough.    Dispense:  90 mL    Refill:  0   Cough medication sedation precautions. Recommend f/u in 24-48 hours with PCP or here. Recommendation per radiologist to have repeat CXR in 3-4 weeks. She may see PCP for this. OTC symptom care as needed.  Reviewed expectations re: course of current medical issues. Questions answered. Outlined signs and symptoms indicating need for more acute intervention. Patient verbalized understanding. After Visit Summary given.   SUBJECTIVE: History from: patient.  Melanie Reynolds is a 66 y.o. female who presents with complaint of nasal congestion, post-nasal drainage, and a persistent cough. Onset abrupt, approximately several days ago. Overall fatigued. SOB: none. Wheezing: mild. Fever: yes, subjective. Overall normal PO intake without n/v. Sick contacts: no. OTC treatment: None.  Social History   Tobacco Use  Smoking Status Former Smoker  . Packs/day: 0.50  . Years: 45.00  . Pack years: 22.50  . Types: Cigarettes  . Last attempt to quit: 01/27/2016  . Years since quitting: 1.2  Smokeless Tobacco Never Used  Tobacco Comment   h/o 1 PPD   ROS: As per HPI.   OBJECTIVE:  Vitals:   04/28/17 1017  BP: 130/80  Pulse: (!) 105  Resp: (!) 22  Temp: 99 F (37.2 C)  SpO2: 97%    General appearance: alert; no distress HEENT: nasal congestion; clear runny nose; throat irritation  secondary to post-nasal drainage Neck: supple without LAD CV: tachycardia with regular rhythm Lungs: clear to auscultation bilaterally overall but slightly decreased breath sounds at bases; significant coughing; no respiratory distress; able to speak full sentences; recheck RR 18 Skin: warm and dry Ext: no edema Psychological: alert and cooperative; normal mood and affect  Imaging: Dg Chest 2 View  Result Date: 04/28/2017 CLINICAL DATA:  Two days of cough, fever, diarrhea. Current smoker. History of asthma and diabetes and previous episodes of pneumonia EXAM: CHEST  2 VIEW COMPARISON:  Chest x-ray of May 28, 2016 FINDINGS: The lungs are adequately inflated. There is increased density at both lung bases. There is no pleural effusion. The interstitial markings are mildly prominent though stable. The heart and pulmonary vascularity are normal. There is calcification in the wall of the aortic arch. There is multilevel degenerative disc disease of the thoracic spine. IMPRESSION: Bibasilar subsegmental atelectasis or early pneumonia superimposed upon chronic bronchitic changes. Followup PA and lateral chest X-ray is recommended in 3-4 weeks following trial of antibiotic therapy to ensure resolution and exclude underlying malignancy. Thoracic aortic atherosclerosis. Electronically Signed   By: David  Martinique M.D.   On: 04/28/2017 10:46    Allergies  Allergen Reactions  . Latex Itching  . Naproxen Other (See Comments)    Abdominal pain    Past Medical History:  Diagnosis Date  . Anxiety   . Arthritis    "knees,  legs, back" (06/01/2013)  . Bipolar depression ([place])   . Chronic lower back pain   . High cholesterol   . OSA (obstructive sleep apnea)   . Pneumonia 1970's; 1980's; 06/01/2013   "total of 3 times"  . Type II diabetes mellitus ([facility])    Family History  Problem Relation Age of Onset  . Kidney failure Mother    Social History   Socioeconomic History  . Marital status: Divorced     Spouse name: Not on file  . Number of children: Not on file  . Years of education: Not on file  . Highest education level: Not on file  Social Needs  . Financial resource strain: Not on file  . Food insecurity - worry: Not on file  . Food insecurity - inability: Not on file  . Transportation needs - medical: Not on file  . Transportation needs - non-medical: Not on file  Occupational History  . Occupation: disabled  Tobacco Use  . Smoking status: Former Smoker    Packs/day: 0.50    Years: 45.00    Pack years: 22.50    Types: Cigarettes    Last attempt to quit: 01/27/2016    Years since quitting: 1.2  . Smokeless tobacco: Never Used  . Tobacco comment: h/o 1 PPD  Substance and Sexual Activity  . Alcohol use: Yes    Comment: 06/01/2013 "glass of wine maybe q 6 months, if that"  . Drug use: Yes    Types: Marijuana  . Sexual activity: Not Currently  Other Topics Concern  . Not on file  Social History Narrative  . Not on file           Vanessa Kick, MD 04/28/17 1128

## 2017-04-28 NOTE — Discharge Instructions (Signed)
Please return here or to the Emergency Department immediately should you feel worse in any way. Please follow up for a recheck in 24-48 hours in order to ensure that you are not developing a problem that might require hospitalization.  Be aware, your cough medication may cause drowsiness. Please do not drive, operate heavy machinery or make important decisions while on this medication, it can cloud your judgement.  Recommend a repeat chest x-ray in 3-4 weeks.

## 2017-06-09 ENCOUNTER — Other Ambulatory Visit: Payer: Self-pay | Admitting: Neurosurgery

## 2017-06-09 DIAGNOSIS — M25512 Pain in left shoulder: Secondary | ICD-10-CM

## 2017-06-13 ENCOUNTER — Ambulatory Visit
Admission: RE | Admit: 2017-06-13 | Discharge: 2017-06-13 | Disposition: A | Discharge status: Home / Self Care | Payer: Medicare HMO | Source: Ambulatory Visit | Attending: Neurosurgery | Admitting: Neurosurgery

## 2017-06-13 DIAGNOSIS — M25512 Pain in left shoulder: Secondary | ICD-10-CM

## 2017-08-18 ENCOUNTER — Other Ambulatory Visit: Payer: Self-pay | Admitting: Surgery

## 2017-08-18 DIAGNOSIS — R109 Unspecified abdominal pain: Secondary | ICD-10-CM

## 2017-08-27 ENCOUNTER — Ambulatory Visit
Admission: RE | Admit: 2017-08-27 | Discharge: 2017-08-27 | Disposition: A | Discharge status: Home / Self Care | Payer: Medicare HMO | Source: Ambulatory Visit | Attending: Surgery | Admitting: Surgery

## 2017-08-27 DIAGNOSIS — R109 Unspecified abdominal pain: Secondary | ICD-10-CM

## 2017-08-27 MED ORDER — IOPAMIDOL (ISOVUE-300) INJECTION 61%
125.0000 mL | Freq: Once | INTRAVENOUS | Status: AC | PRN
  Administered 2017-08-27: 125 mL via INTRAVENOUS

## 2017-10-17 ENCOUNTER — Encounter: Payer: Self-pay | Admitting: Gastroenterology

## 2017-10-24 ENCOUNTER — Encounter: Payer: Self-pay | Admitting: Gastroenterology

## 2017-10-24 ENCOUNTER — Ambulatory Visit (INDEPENDENT_AMBULATORY_CARE_PROVIDER_SITE_OTHER): Payer: Medicare HMO | Admitting: Gastroenterology

## 2017-10-24 ENCOUNTER — Encounter (INDEPENDENT_AMBULATORY_CARE_PROVIDER_SITE_OTHER): Payer: Self-pay

## 2017-10-24 VITALS — BP 90/70 | HR 80 | Ht 61.5 in | Wt 219.4 lb

## 2017-10-24 DIAGNOSIS — K582 Mixed irritable bowel syndrome: Secondary | ICD-10-CM

## 2017-10-24 DIAGNOSIS — R1013 Epigastric pain: Secondary | ICD-10-CM | POA: Diagnosis not present

## 2017-10-24 DIAGNOSIS — Z86010 Personal history of colonic polyps: Secondary | ICD-10-CM

## 2017-10-24 MED ORDER — LUBIPROSTONE 24 MCG PO CAPS: 24.0000 ug | ORAL_CAPSULE | Freq: Two times a day (BID) | ORAL | 3 refills | Status: DC

## 2017-10-24 MED ORDER — NA SULFATE-K SULFATE-MG SULF 17.5-3.13-1.6 GM/177ML PO SOLN: 1.0000 | Freq: Once | ORAL | 0 refills | Status: AC

## 2017-10-24 NOTE — Progress Notes (Signed)
Melanie Reynolds    [id number]    December 31, 1950  Primary Care Physician:Avbuere, Christean Grief, MD  Referring Physician: Nolene Ebbs, MD [address] [place], [place] [address]  Chief complaint:Epigastric abdominal pain   HPI: 67 year old female with history of morbid obesity, obstructive sleep apnea, diabetes status post open cholecystectomy about 30 years ago here with complaints of epigastric abdominal pain worse in the past few months.  She feels a knot in her upper abdomen that she has to massage.  No relationship to diet or activity.  She saw Dr Dema Severin at [place] for possible ventral hernia, he ordered CT abdomen and pelvis with contrast was negative for any acute process in the abdomen or pelvis.  Status post cholecystectomy with CBD dilation within normal variation and right renal cyst. Denies any nausea, vomiting, dysphagia, odynophagia, melena or blood per rectum. She has history of peptic ulcer disease, had stomach ulcers in her 63s. On review of system positive for Constipation alternating with diarrhea, uses stool softener.  Colonoscopy about 5 years ago with removal of polyps by [facility] per patient, report is not available and she was recommended to repeat in 5 years and she thinks she may be due for colonoscopy now.  Outpatient Encounter Medications as of 10/24/2017  Medication Sig  . cetirizine (ZYRTEC) 10 MG tablet Take 1 tablet by mouth at bedtime as needed.  . clobetasol ointment (TEMOVATE) 1.95 % Apply 1 application topically 2 (two) times daily.  . diclofenac sodium (VOLTAREN) 1 % GEL Apply 4 g topically 4 (four) times daily as needed.  . diphenhydrAMINE (BENADRYL) 25 mg capsule Take 1 capsule (25 mg total) by mouth every 8 (eight) hours as needed for itching or allergies.  . fluticasone (FLONASE) 50 MCG/ACT nasal spray Place 2 sprays into both nostrils daily. (Patient taking differently: Place 2 sprays into both nostrils as needed. )  . furosemide (LASIX) 20 MG tablet Take 1  tablet by mouth daily.  Marland Kitchen lithium carbonate 150 MG capsule Take 150 mg by mouth daily.  Marland Kitchen lithium carbonate 300 MG capsule Take 300 mg by mouth daily.  Marland Kitchen LYRICA 150 MG capsule Take 150 mg by mouth 3 (three) times daily.  . meclizine (ANTIVERT) 12.5 MG tablet Take 1 tablet by mouth 3 (three) times daily as needed.  Marland Kitchen morphine (MS CONTIN) 30 MG 12 hr tablet Take 30 mg by mouth every 12 (twelve) hours.  . ondansetron (ZOFRAN-ODT) 4 MG disintegrating tablet Take 1 tablet by mouth as needed.  Marland Kitchen oxyCODONE (ROXICODONE) 15 MG immediate release tablet Take 15 mg by mouth every 6 (six) hours as needed. for pain  . potassium chloride SA (K-DUR,KLOR-CON) 20 MEQ tablet Take 20 mEq by mouth daily.  . ranitidine (ZANTAC) 150 MG tablet Take 150 mg by mouth 2 (two) times daily.  . simvastatin (ZOCOR) 10 MG tablet Take 1 tablet by mouth daily.  Marland Kitchen tiZANidine (ZANAFLEX) 4 MG tablet Take 4 mg by mouth 2 (two) times daily as needed.  . topiramate (TOPAMAX) 50 MG tablet Take 50 mg by mouth daily.   Marland Kitchen VICTOZA 18 MG/3ML SOPN Inject 18 mg into the skin daily.   . [DISCONTINUED] albuterol (PROVENTIL HFA;VENTOLIN HFA) 108 (90 BASE) MCG/ACT inhaler Inhale 2 puffs into the lungs every 6 (six) hours as needed for wheezing or shortness of breath.  . [DISCONTINUED] azithromycin (ZITHROMAX) 250 MG tablet Take 1 tablet (250 mg total) by mouth daily. Take first 2 tablets together, then 1 every day until finished.  . [  DISCONTINUED] HYDROcodone-acetaminophen (NORCO) 5-325 MG per tablet Take 1 tablet by mouth every 4 (four) hours as needed. (Patient taking differently: Take 1 tablet by mouth every 4 (four) hours as needed for moderate pain. )  . [DISCONTINUED] HYDROcodone-homatropine (HYCODAN) 5-1.5 MG/5ML syrup Take 5 mLs by mouth every 6 (six) hours as needed for cough.  . [DISCONTINUED] pregabalin (LYRICA) 100 MG capsule Take 100 mg by mouth 3 (three) times daily.   . [DISCONTINUED] traZODone (DESYREL) 50 MG tablet Take 1 tablet by  mouth at bedtime as needed.    No facility-administered encounter medications on file as of 10/24/2017.     Allergies as of 10/24/2017 - Review Complete 10/24/2017  Allergen Reaction Noted  . Latex Itching 03/29/2012  . Naproxen Other (See Comments) 08/29/2015    Past Medical History:  Diagnosis Date  . Anxiety   . Arthritis    "knees, legs, back" (06/01/2013)  . Bipolar depression ([place])   . Chronic lower back pain   . Depression   . High cholesterol   . OSA (obstructive sleep apnea)   . Pneumonia 1970's; 1980's; 06/01/2013   "total of 3 times"  . Type II diabetes mellitus ([place])     Past Surgical History:  Procedure Laterality Date  . ABDOMINAL HYSTERECTOMY  1982  . BACK SURGERY    . CHOLECYSTECTOMY  1990's  . FOREARM FRACTURE SURGERY Left 1999   "put in plates"  . IMPLANTATION VAGAL NERVE STIMULATOR  2013  . POSTERIOR LUMBAR FUSION  2000's  . SHOULDER OPEN ROTATOR CUFF REPAIR Right 2000  . TUBAL LIGATION  1978  . VAGAL NERVE STIMULATOR REMOVAL  2013   "got an infection in there" (06/01/2013)    Family History  Problem Relation Age of Onset  . Kidney failure Mother     Social History   Socioeconomic History  . Marital status: Divorced    Spouse name: Not on file  . Number of children: 3  . Years of education: Not on file  . Highest education level: Not on file  Occupational History  . Occupation: disabled  Social Needs  . Financial resource strain: Not on file  . Food insecurity:    Worry: Not on file    Inability: Not on file  . Transportation needs:    Medical: Not on file    Non-medical: Not on file  Tobacco Use  . Smoking status: Current Every Day Smoker    Packs/day: 0.50    Years: 45.00    Pack years: 22.50    Types: Cigarettes  . Smokeless tobacco: Never Used  . Tobacco comment: h/o 1 PPD  Substance and Sexual Activity  . Alcohol use: Yes    Comment: occasional  . Drug use: Yes    Types: Marijuana  . Sexual activity: Not Currently    Lifestyle  . Physical activity:    Days per week: Not on file    Minutes per session: Not on file  . Stress: Not on file  Relationships  . Social connections:    Talks on phone: Not on file    Gets together: Not on file    Attends religious service: Not on file    Active member of club or organization: Not on file    Attends meetings of clubs or organizations: Not on file    Relationship status: Not on file  . Intimate partner violence:    Fear of current or ex partner: Not on file    Emotionally abused: Not  on file    Physically abused: Not on file    Forced sexual activity: Not on file  Other Topics Concern  . Not on file  Social History Narrative  . Not on file      Review of systems: Review of Systems  Constitutional: Negative for fever and chills.  HENT: Negative.   Eyes: Negative for blurred vision.  Respiratory: Negative for cough, shortness of breath and wheezing.   Cardiovascular: Negative for chest pain and palpitations.  Gastrointestinal: as per HPI Genitourinary: Negative for dysuria, urgency, frequency and hematuria.  Musculoskeletal: Positive for myalgias, back pain and joint pain.  Skin: Negative for itching and rash.  Neurological: Negative for dizziness, tremors, focal weakness, seizures and loss of consciousness.  Positive for sleeping problem Endo/Heme/Allergies: Positive for seasonal allergies.  Psychiatric/Behavioral: Negative for depression, suicidal ideas and hallucinations.  All other systems reviewed and are negative.   Physical Exam: Vitals:   10/24/17 1320  BP: 90/70  Pulse: 80   Body mass index is 40.78 kg/m. Gen:      No acute distress HEENT:  EOMI, sclera anicteric Neck:     No masses; no thyromegaly Lungs:    Clear to auscultation bilaterally; normal respiratory effort CV:         Regular rate and rhythm; no murmurs Abd:      + bowel sounds; soft, non-tender; no palpable masses, no distension Ext:    No edema; adequate  peripheral perfusion Skin:      Warm and dry; no rash Neuro: alert and oriented x 3 Psych: normal mood and affect  Data Reviewed:  Reviewed labs, radiology imaging, old records and pertinent past GI work up   Assessment and Plan/Recommendations:  67 year old female with morbid obesity, diabetes, obstructive sleep apnea status post cholecystectomy with epigastric abdominal pain  Trial of FD Gard 1 capsule up to 3 times daily as needed Scheduled for EGD to evaluate, exclude severe gastritis or peptic ulcer disease The risks and benefits as well as alternatives of endoscopic procedure(s) have been discussed and reviewed. All questions answered. The patient agrees to proceed.   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 Schedule colonoscopy along with EGD    K. Denzil Magnuson , MD [phone]    CC: Nolene Ebbs, MD

## 2017-10-24 NOTE — Patient Instructions (Addendum)
If you are age 66 or older, your body mass index should be between 23-30. Your Body mass index is 40.78 kg/m. If this is out of the aforementioned range listed, please consider follow up with your Primary Care Provider.  If you are age 52 or younger, your body mass index should be between 19-25. Your Body mass index is 40.78 kg/m. If this is out of the aformentioned range listed, please consider follow up with your Primary Care Provider.    You have been scheduled for an endoscopy and colonoscopy. Please follow the written instructions given to you at your visit today. Please pick up your prep supplies at the pharmacy within the next 1-3 days. If you use inhalers (even only as needed), please bring them with you on the day of your procedure. Your physician has requested that you go to www.startemmi.com and enter the access code given to you at your visit today. This web site gives a general overview about your procedure. However, you should still follow specific instructions given to you by our office regarding your preparation for the procedure.   Use Amitiza 24 mcg twice a day   Use  Bebefiber 1 tablespoon three times a day with meals  Take FDGard 1 capsule three times a day as needed   ______________________________________________________________________    ______________________________________________________________________                X  OTHER NON-INSULIN INJECTABLE MEDICATIONS         (Tanzeum, Trulicity, Byetta, Victoza, Bydureon, SymlinPen)  Hold the am of the procedure

## 2017-10-26 ENCOUNTER — Encounter: Payer: Self-pay | Admitting: Gastroenterology

## 2017-11-18 ENCOUNTER — Encounter: Payer: Self-pay | Admitting: *Deleted

## 2017-12-05 ENCOUNTER — Telehealth: Payer: Self-pay | Admitting: *Deleted

## 2017-12-05 NOTE — Telephone Encounter (Signed)
Amitiza approved until 04/28/2018  Approval sent in to be scanned in

## 2017-12-18 ENCOUNTER — Encounter: Payer: Medicare HMO | Admitting: Gastroenterology

## 2018-01-27 ENCOUNTER — Other Ambulatory Visit: Payer: Self-pay

## 2018-01-27 ENCOUNTER — Ambulatory Visit (AMBULATORY_SURGERY_CENTER): Payer: Self-pay

## 2018-01-27 VITALS — Ht 62.0 in | Wt 219.4 lb

## 2018-01-27 DIAGNOSIS — Z86010 Personal history of colonic polyps: Secondary | ICD-10-CM

## 2018-01-27 DIAGNOSIS — R1013 Epigastric pain: Secondary | ICD-10-CM

## 2018-01-27 NOTE — Progress Notes (Signed)
No egg or soy allergy known to patient  No issues with past sedation with any surgeries  or procedures, no intubation problems  No diet pills per patient No home 02 use per patient  No blood thinners per patient  Pt denies issues with constipation  No A fib or A flutter  EMMI video sent to pt's e mail , pt declined    

## 2018-02-10 ENCOUNTER — Encounter: Payer: Self-pay | Admitting: Gastroenterology

## 2018-02-10 ENCOUNTER — Ambulatory Visit (AMBULATORY_SURGERY_CENTER): Payer: Medicare HMO | Admitting: Gastroenterology

## 2018-02-10 VITALS — BP 108/76 | HR 84 | Temp 97.5°F | Resp 19 | Ht 61.0 in | Wt 219.0 lb

## 2018-02-10 DIAGNOSIS — B9681 Helicobacter pylori [H. pylori] as the cause of diseases classified elsewhere: Secondary | ICD-10-CM | POA: Diagnosis not present

## 2018-02-10 DIAGNOSIS — D128 Benign neoplasm of rectum: Secondary | ICD-10-CM

## 2018-02-10 DIAGNOSIS — Z86010 Personal history of colonic polyps: Secondary | ICD-10-CM | POA: Diagnosis present

## 2018-02-10 DIAGNOSIS — D122 Benign neoplasm of ascending colon: Secondary | ICD-10-CM

## 2018-02-10 DIAGNOSIS — K2970 Gastritis, unspecified, without bleeding: Secondary | ICD-10-CM | POA: Diagnosis not present

## 2018-02-10 DIAGNOSIS — D123 Benign neoplasm of transverse colon: Secondary | ICD-10-CM | POA: Diagnosis not present

## 2018-02-10 DIAGNOSIS — D125 Benign neoplasm of sigmoid colon: Secondary | ICD-10-CM

## 2018-02-10 DIAGNOSIS — R1013 Epigastric pain: Secondary | ICD-10-CM

## 2018-02-10 DIAGNOSIS — R109 Unspecified abdominal pain: Secondary | ICD-10-CM

## 2018-02-10 MED ORDER — SODIUM CHLORIDE 0.9 % IV SOLN: 500.0000 mL | Freq: Once | INTRAVENOUS | Status: DC

## 2018-02-10 NOTE — Progress Notes (Signed)
Called to room to assist during endoscopic procedure.  Patient ID and intended procedure confirmed with present staff. Received instructions for my participation in the procedure from the performing physician.  

## 2018-02-10 NOTE — Progress Notes (Signed)
Right hand sore small amount of swelling.

## 2018-02-10 NOTE — Progress Notes (Signed)
To PACU, VSS. Report to [email or web address] 

## 2018-02-10 NOTE — Op Note (Signed)
[place] Patient Name: Melanie Reynolds Procedure Date: 02/10/2018 10:07 AM MRN: [id number] Endoscopist: Mauri Pole , MD Age: 67 Referring MD:  Date of Birth: [birth year] Gender: Female Account #: [id number] Procedure:                Colonoscopy Indications:              High risk colon cancer surveillance: Personal                            history of colonic polyps, Last colonoscopy 5 years                            ago Medicines:                Monitored Anesthesia Care Procedure:                Pre-Anesthesia Assessment:                           - Prior to the procedure, a History and Physical                            was performed, and patient medications and                            allergies were reviewed. The patient's tolerance of                            previous anesthesia was also reviewed. The risks                            and benefits of the procedure and the sedation                            options and risks were discussed with the patient.                            All questions were answered, and informed consent                            was obtained. Prior Anticoagulants: The patient has                            taken no previous anticoagulant or antiplatelet                            agents. ASA Grade Assessment: III - A patient with                            severe systemic disease. After reviewing the risks                            and benefits, the patient was deemed in  satisfactory condition to undergo the procedure.                           After obtaining informed consent, the colonoscope                            was passed under direct vision. Throughout the                            procedure, the patient's blood pressure, pulse, and                            oxygen saturations were monitored continuously. The                            Model PCF-[id number]) scope was  introduced                            through the anus and advanced to the the cecum,                            identified by appendiceal orifice and ileocecal                            valve. The colonoscopy was performed without                            difficulty. The patient tolerated the procedure                            well. The quality of the bowel preparation was                            excellent. The ileocecal valve, appendiceal                            orifice, and rectum were photographed. Scope In: 10:26:25 AM Scope Out: 10:54:44 AM Scope Withdrawal Time: 0 hours 24 minutes 47 seconds  Total Procedure Duration: 0 hours 28 minutes 19 seconds  Findings:                 The perianal and digital rectal examinations were                            normal.                           Seven sessile polyps were found in the rectum X1 ,                            recto-sigmoid colon X1 , transverse colon X1 and                            ascending colon X 4. The polyps were 1 to 3 mm in  size. These polyps were removed with a cold biopsy                            forceps. Resection and retrieval were complete.                           Seven sessile polyps were found in the transverse                            colon X6 and ascending colon X1 . The polyps were 5                            to 7 mm in size. These polyps were removed with a                            cold snare. Resection and retrieval were complete.                           Scattered small-mouthed diverticula were found in                            the sigmoid colon, descending colon and ascending                            colon.                           Non-bleeding internal hemorrhoids were found during                            retroflexion. The hemorrhoids were small. Complications:            No immediate complications. Estimated Blood Loss:     Estimated blood loss was  minimal. Impression:               - Seven 1 to 3 mm polyps in the rectum, at the                            recto-sigmoid colon, in the transverse colon and in                            the ascending colon, removed with a cold biopsy                            forceps. Resected and retrieved.                           - Seven 5 to 7 mm polyps in the transverse colon                            and in the ascending colon, removed with a cold  snare. Resected and retrieved.                           - Diverticulosis in the sigmoid colon, in the                            descending colon and in the ascending colon.                           - Non-bleeding internal hemorrhoids. Recommendation:           - Patient has a contact number available for                            emergencies. The signs and symptoms of potential                            delayed complications were discussed with the                            patient. Return to normal activities tomorrow.                            Written discharge instructions were provided to the                            patient.                           - Resume previous diet.                           - Continue present medications.                           - Await pathology results.                           - Repeat colonoscopy date to be determined after                            pending pathology results are reviewed for                            surveillance based on pathology results. Mauri Pole, MD 02/10/2018 11:05:05 AM This report has been signed electronically.

## 2018-02-10 NOTE — Progress Notes (Addendum)
Patient has a swollen, red area on r middle hand joint.  Area is about 2" x 2". It's warm to touch, and red.  I asked her daughter about it, and she stated that her mother had"contact with skin to skin.  This area was not the result of anything that was done by this institution.  Patient " had skin to skin contact" yesterday.  I told her to go to urgent care to have an x-ray. She agreed.

## 2018-02-10 NOTE — Progress Notes (Signed)
Pt's states no medical or surgical changes since previsit or office visit. 

## 2018-02-10 NOTE — Op Note (Signed)
[place] Patient Name: Melanie Reynolds Procedure Date: 02/10/2018 10:07 AM MRN: [id number] Endoscopist: Mauri Pole , MD Age: 67 Referring MD:  Date of Birth: Jul 09, 1950 Gender: Female Account #: [id number] Procedure:                Upper GI endoscopy Indications:              Upper abdominal pain, Dyspepsia Medicines:                Monitored Anesthesia Care Procedure:                Pre-Anesthesia Assessment:                           - Prior to the procedure, a History and Physical                            was performed, and patient medications and                            allergies were reviewed. The patient's tolerance of                            previous anesthesia was also reviewed. The risks                            and benefits of the procedure and the sedation                            options and risks were discussed with the patient.                            All questions were answered, and informed consent                            was obtained. Prior Anticoagulants: The patient has                            taken no previous anticoagulant or antiplatelet                            agents. ASA Grade Assessment: III - A patient with                            severe systemic disease. After reviewing the risks                            and benefits, the patient was deemed in                            satisfactory condition to undergo the procedure.                           After obtaining informed consent, the endoscope was  passed under direct vision. Throughout the                            procedure, the patient's blood pressure, pulse, and                            oxygen saturations were monitored continuously. The                            Endoscope was introduced through the mouth, and                            advanced to the second part of duodenum. The upper                            GI endoscopy  was accomplished without difficulty.                            The patient tolerated the procedure well. Scope In: Scope Out: Findings:                 No gross lesions were noted in the entire esophagus.                           The Z-line was regular and was found 36 cm from the                            incisors.                           Patchy moderate inflammation with hemorrhage                            characterized by adherent blood, congestion                            (edema), erythema, friability and mucus was found                            in the entire examined stomach. Biopsies were taken                            with a cold forceps for Helicobacter pylori testing.                           The examined duodenum was normal. Complications:            No immediate complications. Estimated Blood Loss:     Estimated blood loss was minimal. Impression:               - No gross lesions in esophagus.                           - Z-line regular, 36 cm from the incisors.                           -  Gastritis with hemorrhage. Biopsied.                           - Normal examined duodenum. Recommendation:           - Patient has a contact number available for                            emergencies. The signs and symptoms of potential                            delayed complications were discussed with the                            patient. Return to normal activities tomorrow.                            Written discharge instructions were provided to the                            patient.                           - Resume previous diet.                           - Continue present medications.                           - Await pathology results. Mauri Pole, MD 02/10/2018 11:00:25 AM This report has been signed electronically.

## 2018-02-10 NOTE — Patient Instructions (Signed)
YOU HAD AN ENDOSCOPIC PROCEDURE TODAY AT [facility]:   Refer to the procedure report that was given to you for any specific questions about what was found during the examination.  If the procedure report does not answer your questions, please call your gastroenterologist to clarify.  If you requested that your care partner not be given the details of your procedure findings, then the procedure report has been included in a sealed envelope for you to review at your convenience later.  YOU SHOULD EXPECT: Some feelings of bloating in the abdomen. Passage of more gas than usual.  Walking can help get rid of the air that was put into your GI tract during the procedure and reduce the bloating. If you had a lower endoscopy (such as a colonoscopy or flexible sigmoidoscopy) you may notice spotting of blood in your stool or on the toilet paper. If you underwent a bowel prep for your procedure, you may not have a normal bowel movement for a few days.  Please Note:  You might notice some irritation and congestion in your nose or some drainage.  This is from the oxygen used during your procedure.  There is no need for concern and it should clear up in a day or so.  SYMPTOMS TO REPORT IMMEDIATELY:   Following lower endoscopy (colonoscopy or flexible sigmoidoscopy):  Excessive amounts of blood in the stool  Significant tenderness or worsening of abdominal pains  Swelling of the abdomen that is new, acute  Fever of 100F or higher   Following upper endoscopy (EGD)  Vomiting of blood or coffee ground material  New chest pain or pain under the shoulder blades  Painful or persistently difficult swallowing  New shortness of breath  Fever of 100F or higher  Black, tarry-looking stools  For urgent or emergent issues, a gastroenterologist can be reached at any hour by calling [phone].   DIET:  We do recommend a small meal at first, but then you may proceed to your regular diet.  Drink  plenty of fluids but you should avoid alcoholic beverages for 24 hours.  Try to increase the fiber in your diet, and drink plenty of water.  ACTIVITY:  You should plan to take it easy for the rest of today and you should NOT DRIVE or use heavy machinery until tomorrow (because of the sedation medicines used during the test).    FOLLOW UP: Our staff will call the number listed on your records the next business day following your procedure to check on you and address any questions or concerns that you may have regarding the information given to you following your procedure. If we do not reach you, we will leave a message.  However, if you are feeling well and you are not experiencing any problems, there is no need to return our call.  We will assume that you have returned to your regular daily activities without incident.  If any biopsies were taken you will be contacted by phone or by letter within the next 1-3 weeks.  Please call us at [phone] if you have not heard about the biopsies in 3 weeks.    SIGNATURES/CONFIDENTIALITY: You and/or your care partner have signed paperwork which will be entered into your electronic medical record.  These signatures attest to the fact that that the information above on your After Visit Summary has been reviewed and is understood.  Full responsibility of the confidentiality of this discharge information lies with you and/or  your care-partner.  Read all of the handouts given to  You by your recovery room nurse.

## 2018-02-11 ENCOUNTER — Telehealth: Payer: Self-pay | Admitting: *Deleted

## 2018-02-11 NOTE — Telephone Encounter (Signed)
  Follow up Call-  Call back number 02/10/2018  Post procedure Call Back phone  # [phone]  Permission to leave phone message Yes  Some recent data might be hidden     Patient questions:  Do you have a fever, pain , or abdominal swelling? No. Pain Score  0 *  Have you tolerated food without any problems? Yes.    Have you been able to return to your normal activities? Yes.    Do you have any questions about your discharge instructions: Diet   No. Medications  No. Follow up visit  No.  Do you have questions or concerns about your Care? No.  Actions: * If pain score is 4 or above: No action needed, pain <4.

## 2018-02-23 ENCOUNTER — Encounter: Payer: Self-pay | Admitting: Gastroenterology

## 2018-02-24 ENCOUNTER — Other Ambulatory Visit: Payer: Self-pay

## 2018-02-24 ENCOUNTER — Telehealth: Payer: Self-pay

## 2018-02-24 DIAGNOSIS — A048 Other specified bacterial intestinal infections: Secondary | ICD-10-CM

## 2018-02-24 MED ORDER — OMEPRAZOLE 40 MG PO CPDR: DELAYED_RELEASE_CAPSULE | ORAL | 0 refills | Status: DC

## 2018-02-24 MED ORDER — BIS SUBCIT-METRONID-TETRACYC 140-125-125 MG PO CAPS: 3.0000 | ORAL_CAPSULE | Freq: Three times a day (TID) | ORAL | 0 refills | Status: DC

## 2018-02-24 NOTE — Telephone Encounter (Signed)
Patient advised. Questions invited and answered.  Prescription sent to [facility]. Waiting to see what her insurance will cover.

## 2018-02-24 NOTE — Telephone Encounter (Signed)
-----   Message from Mauri Pole, MD sent at 02/23/2018 11:27 AM EDT -----  H.pylori positive. Please send prescription for Pylera or Bismuth 524mg  and Flagyl 250mg  1 tablet four times daily, Doxycycline 100mg  1 capsule Twice daily X 10 days along with high dose PPI (omeprazole or Nexium 40mg  BID. Will need to confirm eradication in 6 weeks by checking H.pylori stool Ag, off PPI for 2 weeks prior to the test.

## 2018-03-17 ENCOUNTER — Other Ambulatory Visit: Payer: Self-pay | Admitting: Neurosurgery

## 2018-03-17 ENCOUNTER — Ambulatory Visit
Admission: RE | Admit: 2018-03-17 | Discharge: 2018-03-17 | Disposition: A | Discharge status: Home / Self Care | Payer: Medicare HMO | Source: Ambulatory Visit | Attending: Neurosurgery | Admitting: Neurosurgery

## 2018-03-17 DIAGNOSIS — M545 Low back pain, unspecified: Secondary | ICD-10-CM

## 2018-05-13 ENCOUNTER — Other Ambulatory Visit: Payer: Self-pay | Admitting: Gastroenterology

## 2018-06-11 ENCOUNTER — Telehealth: Payer: Self-pay | Admitting: *Deleted

## 2018-06-11 NOTE — Telephone Encounter (Signed)
Prior auth approved for Amitiza until  04/29/2019

## 2018-06-18 ENCOUNTER — Other Ambulatory Visit: Payer: Self-pay

## 2018-06-18 ENCOUNTER — Ambulatory Visit: Payer: Medicare HMO | Attending: Neurosurgery | Admitting: Occupational Therapy

## 2018-06-18 ENCOUNTER — Encounter: Payer: Self-pay | Admitting: Occupational Therapy

## 2018-06-18 DIAGNOSIS — M25641 Stiffness of right hand, not elsewhere classified: Secondary | ICD-10-CM | POA: Diagnosis present

## 2018-06-18 DIAGNOSIS — R208 Other disturbances of skin sensation: Secondary | ICD-10-CM

## 2018-06-18 DIAGNOSIS — M6281 Muscle weakness (generalized): Secondary | ICD-10-CM

## 2018-06-18 DIAGNOSIS — M79641 Pain in right hand: Secondary | ICD-10-CM

## 2018-06-18 NOTE — Therapy (Signed)
[facility] [address] [place], [place], [address] Phone: [phone]   Fax:  [phone]  Occupational Therapy Evaluation  Patient Details  Name: Melanie Reynolds MRN: [id number] Date of Birth: [birth year] Referring Provider (OT): Dr. Ashok Pall   Encounter Date: 06/18/2018  OT End of Session - 06/18/18 1332    Visit Number  1    Number of Visits  13    Date for OT Re-Evaluation  08/17/18    Authorization Type  [employer] Medicare / Medicaid--awaiting [employer] auth (need progress note)    Authorization - Visit Number  1    Authorization - Number of Visits  10    OT Start Time  1105    OT Stop Time  1150    OT Time Calculation (min)  45 min    Activity Tolerance  Patient tolerated treatment well    Behavior During Therapy  [facility] for tasks assessed/performed       Past Medical History:  Diagnosis Date  . Anxiety   . Arthritis    "knees, legs, back" (06/01/2013)  . Bipolar depression ([place])   . Cataract   . Chronic lower back pain   . Constipation, chronic   . Depression   . High cholesterol   . Irritable bowel syndrome (IBS)   . OSA (obstructive sleep apnea)   . Pneumonia 1970's; 1980's; 06/01/2013   "total of 3 times"  . Sleep apnea    cpap  . Type II diabetes mellitus ([place])     Past Surgical History:  Procedure Laterality Date  . ABDOMINAL HYSTERECTOMY  1982  . BACK SURGERY    . CHOLECYSTECTOMY  1990's  . FOREARM FRACTURE SURGERY Left 1999   "put in plates"  . IMPLANTATION VAGAL NERVE STIMULATOR  2013  . POSTERIOR LUMBAR FUSION  2000's  . SHOULDER OPEN ROTATOR CUFF REPAIR Right 2000  . TUBAL LIGATION  1978  . VAGAL NERVE STIMULATOR REMOVAL  2013   "got an infection in there" (06/01/2013)    There were no vitals filed for this visit.  Subjective Assessment - 06/18/18 1112    Subjective   Pt reports that she is afraid that therapy will hurt    Pertinent History  PMH:  bipolar disorder, DM, hypotension,  OSA, hx of L foreram    Patient Stated Goals  reduce pain in R hand    Currently in Pain?  Yes    Pain Score  6     Pain Location  Head    Pain Orientation  Right    Pain Descriptors / Indicators  Sharp;Radiating    Pain Type  Surgical pain    Pain Radiating Towards  elbow    Pain Onset  More than a month ago    Pain Frequency  Constant    Aggravating Factors   use     Pain Relieving Factors  rest        [facility] OT Assessment - 06/18/18 0001      Assessment   Medical Diagnosis  R Carpal Tunnel Release    pt also reports "nerve release" at right elbow at same time    Referring Provider (OT)  Dr. Ashok Pall    Onset Date/Surgical Date  05/03/18    Hand Dominance  Right    Next MD Visit  --   ? next 1-2 weeks   Prior Therapy  none for CTS      Precautions   Precautions  None  Balance Screen   Has the patient fallen in the past 6 months  No      Home  Environment   Family/patient expects to be discharged to:  Private residence    Lives With  Alone   son has helped some     Prior Function   Level of Independence  Independent    Vocation  Retired   was on disability from [employer] cue to back    [employer]  couldn't stand for long periods of time, unable to lift heavy weights    Leisure  travel, hang out with friends, listen to music, read, play game on computer, cook      ADL   Eating/Feeding  --   mod I   Grooming  --   electric toothbrush, difficulty with manipulating and wrist   Upper Body Bathing  Modified independent    Lower Body Bathing  Modified independent    Upper Body Dressing  --   difficulty fastening bra   Lower Body Dressing  --   difficulty pulling up pants with RUE     IADL   Prior Level of Function Shopping  unable to carry groceries with weight, put on shoulder    Prior Level of Function [employer]  difficulty cutting, unable to slice/chopping and using LUE to [place] own vehicle     Medication Management  Is responsible for taking medication in correct dosages at correct time      Mobility   Mobility Status  Independent    Mobility Status Comments  using single-point cane in LUE now, was using in RUE prior (but now unable)      Written Expression   Dominant Hand  Right    Handwriting  --   difficulty and decr legibility, some pain     Observation/Other Assessments   Skin Integrity  Pt with incision site on dorsal elbow that appears to be healing well with no signs/symptoms of infection, but with 3-4 cloth stiches visable, incision site at R palm appears to be healing well with no signs/symptoms of infection but noted dry skin at distal scar and pt reports that she "dug out stitch due to pain, but cleaned it well"  Cautioned pt against do this due to risk of injury and infection.       Sensation   Additional Comments  pt reports no numbness/tingling currently (hand numbness in all fingers prior to surgery)      Coordination   9 Hole Peg Test  Right;Left    Right 9 Hole Peg Test  28.94    Left 9 Hole Peg Test  30.87      ROM / Strength   AROM / PROM / Strength  AROM      AROM   Overall AROM Comments  grossly WNL, mild decr isolated IP flex, wrist flex 60*, extension 70*      Hand Function   Right Hand Grip (lbs)  33    Right Hand Lateral Pinch  11 lbs    Right Hand 3 Point Pinch  10 lbs    Left Hand Grip (lbs)  30    Left Hand Lateral Pinch  14 lbs    Left 3 point pinch  15.5 lbs                      OT Education - 06/18/18 1345    Education Details  pt  instructed in scar massage to R palm/elbow incision sites.  Recommended pt call MD if she needs stitch removed and not to try to remove herself; OT eval results/POC    Person(s) Educated  Patient    Methods  Explanation    Comprehension  Verbalized understanding       OT Short Term Goals - 06/18/18 1339      OT SHORT TERM GOAL #1   Title  Pt will be independent with initial HEP and  scar massage.--check STGs 07/24/18    Time  4    Period  Weeks    Status  New      OT SHORT TERM GOAL #2   Title  Pt will be able to cut/slice food using RUE only.    Time  4    Period  Weeks    Status  New      OT SHORT TERM GOAL #3   Title  Pt will report pain less than or equal to 4/10 for ADLs.    Time  4    Period  Weeks    Status  New        OT Long Term Goals - 06/18/18 1341      OT LONG TERM GOAL #1   Title  Pt will be independent with updated HEP.--check LTGs 08/07/18    Time  6    Period  Weeks    Status  New      OT LONG TERM GOAL #2   Title  Pt will improve R lateral and 3point pinch strengths by at least 3lbs each.    Baseline  lateral 11lbs, 3point 10lbs    Time  8    Period  Weeks    Status  New      OT LONG TERM GOAL #3   Title  Pt will improve R grip strength by at least 5lbs to assist with opening containers.    Baseline  33lbs    Time  6    Period  Weeks    Status  New      OT LONG TERM GOAL #4   Title  Pt will be able to lift and carry 5lb bag of groceries at least 47feet with R hand.    Time  6    Period  Weeks    Status  New      OT LONG TERM GOAL #5   Title  Pt will report pain less than or equal to 3/10 for ADLs/IADLs.    Time  6    Period  Weeks    Status  New            Plan - 06/18/18 1333    Clinical Impression Statement  Pt is a 68 y.o. female s/p R CTR surgery 05/03/18.  Pt also reports "nerve release" at R elbow at the same time.  Pt with PMH that includes:  PMH:  bipolar disorder, DM, hypotension, OSA, hx of L forearm fx/surgery, hx of R RTC repair, hx of back surgery.  Pt presents today with decr strength, mild R hand stifness, decr dominant RUE functional use, and pain.  Pt would benefit from occupational thearpy to address these deficits for improved dominant RUE functional use and decr pain for improved quality of life.    Occupational Profile and client history currently impacting functional performance  Pt lives alone  and was independent prior to surgery.  Currently pt is performing ADLs/IADLs mod I, but is  not able to fully use dominant RUE for all activities.    Occupational performance deficits (Please refer to evaluation for details):  ADL's;IADL's;Leisure    Rehab Potential  Good    OT Frequency  2x / week    OT Duration  6 weeks   +eval   OT Treatment/Interventions  Self-care/ADL training;Electrical Stimulation;Therapeutic exercise;Patient/family education;Splinting;Neuromuscular education;Paraffin;Moist Heat;Fluidtherapy;Energy conservation;Scar mobilization;Therapeutic activities;Passive range of motion;Manual Therapy;DME and/or AE instruction;Contrast Bath;Ultrasound;Cryotherapy    Plan  initiate HEP (tendon glides, yellow putty, wrist strengthening)    Clinical Decision Making  Limited treatment options, no task modification necessary    Recommended Other Services  none    Consulted and Agree with Plan of Care  Patient       Patient will benefit from skilled therapeutic intervention in order to improve the following deficits and impairments:  Decreased knowledge of use of DME, Decreased skin integrity, Increased edema, Pain, Impaired sensation, Decreased scar mobility, Decreased coordination, Decreased activity tolerance, Decreased range of motion, Decreased strength, Impaired UE functional use  Visit Diagnosis: Muscle weakness (generalized)  Stiffness of right hand, not elsewhere classified  Pain in right hand  Other disturbances of skin sensation    Problem List Patient Active Problem List   Diagnosis Date Noted  . Allergic rhinitis 01/16/2017  . Legionella pneumonia ([place]) 01/09/2016  . Sepsis ([place]) 01/04/2016  . AKI (acute kidney injury) ([place]) 01/04/2016  . Exertional dyspnea 08/29/2015  . Tobacco user 08/29/2015  . Obesity 08/29/2015  . OSA (obstructive sleep apnea) 08/13/2013  . CAP (community acquired pneumonia) 06/01/2013  . Chest pain, musculoskeletal 06/01/2013  . Bipolar  1 disorder ([place]) 06/01/2013  . Hypotension 06/01/2013  . Chronic pain 06/01/2013  . Diabetes ([place]) 06/01/2013    [facility] 06/18/2018, 1:47 PM  [facility] [address] [place], [place], [address] Phone: [phone]   Fax:  [phone]  Name: Melanie Reynolds MRN: [id number] Date of Birth: October 17, 1950   Vianne Bulls, OTR/L [facility] [address] [place], [place]  [address] [phone] phone [phone] 06/18/18 1:47 PM

## 2018-06-30 ENCOUNTER — Ambulatory Visit: Payer: Medicare HMO | Attending: Neurosurgery | Admitting: Occupational Therapy

## 2018-06-30 DIAGNOSIS — R208 Other disturbances of skin sensation: Secondary | ICD-10-CM

## 2018-06-30 DIAGNOSIS — M6281 Muscle weakness (generalized): Secondary | ICD-10-CM | POA: Insufficient documentation

## 2018-06-30 DIAGNOSIS — M79641 Pain in right hand: Secondary | ICD-10-CM | POA: Diagnosis present

## 2018-06-30 DIAGNOSIS — M25641 Stiffness of right hand, not elsewhere classified: Secondary | ICD-10-CM | POA: Diagnosis present

## 2018-06-30 NOTE — Patient Instructions (Signed)
Flexor Tendon Gliding (Active Hook Fist)   With fingers and knuckles straight, bend middle and tip joints. Do not bend large knuckles. Repeat _10-15___ times. Do _4-6___ sessions per day.  MP Flexion (Active)   With back of hand on table, bend large knuckles as far as they will go, keeping small joints straight. Repeat _10-15___ times. Do __4-6__ sessions per day. Activity: Reach into a narrow container.*      Finger Flexion / Extension   With palm up, bend fingers of left hand toward palm, making a  fist. Straighten fingers, opening fist. Repeat sequence _10-15___ times per session. Do _4-6_ sessions per day. Hand Variation: Palm down   AROM: Wrist Extension   .  With __right__ palm down, bend wrist up. Repeat __15__ times per set.  Do __3_ sessions per day.         AROM: Forearm Pronation / Supination   With _right___ arm in handshake position, slowly rotate palm down until stretch is felt. Relax. Then rotate palm up until stretch is felt. Repeat _15___ times per set. Do _3__ sessions per day.  Copyright  VHI. All rights reserved.       Copyright  VHI. All rights reserved.   1. Grip Strengthening (Resistive Putty)   Squeeze putty using thumb and all fingers. Repeat _20___ times. Do __2__ sessions per day.   2. Roll putty into tube on table and pinch between each finger and thumb x 10 reps each. (can do ring and small finger together)     Copyright  VHI. All rights reserved.

## 2018-06-30 NOTE — Therapy (Signed)
[facility] [address] [place], [place], [address] Phone: [phone]   Fax:  [phone]  Occupational Therapy Treatment  Patient Details  Name: Melanie Reynolds MRN: [id number] Date of Birth: 1950/08/09 Referring Provider (OT): Dr. Ashok Pall   Encounter Date: 06/30/2018  OT End of Session - 06/30/18 1028    Visit Number  2    Number of Visits  13    Date for OT Re-Evaluation  08/17/18    Authorization Type  [employer] Medicare / Medicaid--awaiting [employer] auth (need progress note)    Authorization Time Period  auth 2/20-8/20/2020    Authorization - Visit Number  2    Authorization - Number of Visits  10    OT Start Time  1018    OT Stop Time  1100    OT Time Calculation (min)  42 min       Past Medical History:  Diagnosis Date  . Anxiety   . Arthritis    "knees, legs, back" (06/01/2013)  . Bipolar depression ([place])   . Cataract   . Chronic lower back pain   . Constipation, chronic   . Depression   . High cholesterol   . Irritable bowel syndrome (IBS)   . OSA (obstructive sleep apnea)   . Pneumonia 1970's; 1980's; 06/01/2013   "total of 3 times"  . Sleep apnea    cpap  . Type II diabetes mellitus ([place])     Past Surgical History:  Procedure Laterality Date  . ABDOMINAL HYSTERECTOMY  1982  . BACK SURGERY    . CHOLECYSTECTOMY  1990's  . FOREARM FRACTURE SURGERY Left 1999   "put in plates"  . IMPLANTATION VAGAL NERVE STIMULATOR  2013  . POSTERIOR LUMBAR FUSION  2000's  . SHOULDER OPEN ROTATOR CUFF REPAIR Right 2000  . TUBAL LIGATION  1978  . VAGAL NERVE STIMULATOR REMOVAL  2013   "got an infection in there" (06/01/2013)    There were no vitals filed for this visit.  Subjective Assessment - 06/30/18 1027    Pertinent History  PMH:  bipolar disorder, DM, hypotension, OSA, hx of L foreram    Patient Stated Goals  reduce pain in R hand    Currently in Pain?  Yes    Pain Score  5     Pain Location  Hand     Pain Orientation  Right    Pain Descriptors / Indicators  Aching    Pain Onset  More than a month ago    Pain Frequency  Constant    Aggravating Factors   movement    Pain Relieving Factors  rest    Multiple Pain Sites  No            Treatment: Fluidotherapy x 13 mins to RUE for pain relief and desensitization, no adverse reactions.  Scar massage to palmar incision with lotion followed by [place] 22mhz, 0.8w/cm 2,  20% x 8 mins  For scar mobilization, no adverse reactions, scar is softer following [place].               OT Education - 06/30/18 1251    Education Details  inital HEP see pt instructions    Person(s) Educated  Patient    Methods  Explanation;Demonstration;Verbal cues;Handout    Comprehension  Verbalized understanding;Returned demonstration;Verbal cues required       OT Short Term Goals - 06/18/18 1339      OT SHORT TERM GOAL #1   Title  Pt will be independent with initial HEP and scar massage.--check STGs 07/24/18    Time  4    Period  Weeks    Status  New      OT SHORT TERM GOAL #2   Title  Pt will be able to cut/slice food using RUE only.    Time  4    Period  Weeks    Status  New      OT SHORT TERM GOAL #3   Title  Pt will report pain less than or equal to 4/10 for ADLs.    Time  4    Period  Weeks    Status  New        OT Long Term Goals - 06/18/18 1341      OT LONG TERM GOAL #1   Title  Pt will be independent with updated HEP.--check LTGs 08/07/18    Time  6    Period  Weeks    Status  New      OT LONG TERM GOAL #2   Title  Pt will improve R lateral and 3point pinch strengths by at least 3lbs each.    Baseline  lateral 11lbs, 3point 10lbs    Time  8    Period  Weeks    Status  New      OT LONG TERM GOAL #3   Title  Pt will improve R grip strength by at least 5lbs to assist with opening containers.    Baseline  33lbs    Time  6    Period  Weeks    Status  New      OT LONG TERM GOAL #4   Title  Pt will be able to lift and  carry 5lb bag of groceries at least 46feet with R hand.    Time  6    Period  Weeks    Status  New      OT LONG TERM GOAL #5   Title  Pt will report pain less than or equal to 3/10 for ADLs/IADLs.    Time  6    Period  Weeks    Status  New            Plan - 06/30/18 1250    Clinical Impression Statement  Pt is progressing towards goals. she demonstrates understanding of inital HEP.    Occupational Profile and client history currently impacting functional performance  Pt lives alone and was independent prior to surgery.  Currently pt is performing ADLs/IADLs mod I, but is not able to fully use dominant RUE for all activities.    Occupational performance deficits (Please refer to evaluation for details):  ADL's;IADL's;Leisure    Rehab Potential  Good    OT Frequency  2x / week    OT Duration  6 weeks    OT Treatment/Interventions  Self-care/ADL training;Electrical Stimulation;Therapeutic exercise;Patient/family education;Splinting;Neuromuscular education;Paraffin;Moist Heat;Fluidtherapy;Energy conservation;Scar mobilization;Therapeutic activities;Passive range of motion;Manual Therapy;DME and/or AE instruction;Contrast Bath;Ultrasound;Cryotherapy    Plan  tendon gliding, fluido, [place]    Consulted and Agree with Plan of Care  Patient       Patient will benefit from skilled therapeutic intervention in order to improve the following deficits and impairments:   pain, ROM, strength  Visit Diagnosis: Muscle weakness (generalized)  Stiffness of right hand, not elsewhere classified  Pain in right hand  Other disturbances of skin sensation    Problem List Patient Active Problem List   Diagnosis Date Noted  .  Allergic rhinitis 01/16/2017  . Legionella pneumonia ([place]) 01/09/2016  . Sepsis ([place]) 01/04/2016  . AKI (acute kidney injury) ([place]) 01/04/2016  . Exertional dyspnea 08/29/2015  . Tobacco user 08/29/2015  . Obesity 08/29/2015  . OSA (obstructive sleep apnea) 08/13/2013  .  CAP (community acquired pneumonia) 06/01/2013  . Chest pain, musculoskeletal 06/01/2013  . Bipolar 1 disorder ([place]) 06/01/2013  . Hypotension 06/01/2013  . Chronic pain 06/01/2013  . Diabetes ([place]) 06/01/2013    Melanie Reynolds 06/30/2018, 12:52 PM  [facility] [address] [place], [place], [address] Phone: [phone]   Fax:  [phone]  Name: Melanie Reynolds MRN: [id number] Date of Birth: [birth year]

## 2018-07-07 ENCOUNTER — Encounter: Payer: Self-pay | Admitting: Occupational Therapy

## 2018-07-07 ENCOUNTER — Ambulatory Visit: Payer: Medicare HMO | Admitting: Occupational Therapy

## 2018-07-07 DIAGNOSIS — R208 Other disturbances of skin sensation: Secondary | ICD-10-CM

## 2018-07-07 DIAGNOSIS — M79641 Pain in right hand: Secondary | ICD-10-CM

## 2018-07-07 DIAGNOSIS — M6281 Muscle weakness (generalized): Secondary | ICD-10-CM | POA: Diagnosis not present

## 2018-07-07 DIAGNOSIS — M25641 Stiffness of right hand, not elsewhere classified: Secondary | ICD-10-CM

## 2018-07-07 NOTE — Therapy (Signed)
[facility] [address] [place], [place], [address] Phone: [phone]   Fax:  [phone]  Occupational Therapy Treatment  Patient Details  Name: Melanie Reynolds MRN: [id number] Date of Birth: April 29, 1951 Referring Provider (OT): Dr. Ashok Pall   Encounter Date: 07/07/2018  OT End of Session - 07/07/18 1009    Visit Number  3    Number of Visits  13    Date for OT Re-Evaluation  08/17/18    Authorization Type  [employer] Medicare / Medicaid--awaiting [employer] auth (need progress note)    Authorization Time Period  auth 2/20-8/20/2020    Authorization - Visit Number  3    Authorization - Number of Visits  10    OT Start Time  [phone]   pt arrived late   OT Stop Time  1015    OT Time Calculation (min)  29 min    Activity Tolerance  Patient tolerated treatment well    Behavior During Therapy  [facility] for tasks assessed/performed       Past Medical History:  Diagnosis Date  . Anxiety   . Arthritis    "knees, legs, back" (06/01/2013)  . Bipolar depression ([place])   . Cataract   . Chronic lower back pain   . Constipation, chronic   . Depression   . High cholesterol   . Irritable bowel syndrome (IBS)   . OSA (obstructive sleep apnea)   . Pneumonia 1970's; 1980's; 06/01/2013   "total of 3 times"  . Sleep apnea    cpap  . Type II diabetes mellitus ([place])     Past Surgical History:  Procedure Laterality Date  . ABDOMINAL HYSTERECTOMY  1982  . BACK SURGERY    . CHOLECYSTECTOMY  1990's  . FOREARM FRACTURE SURGERY Left 1999   "put in plates"  . IMPLANTATION VAGAL NERVE STIMULATOR  2013  . POSTERIOR LUMBAR FUSION  2000's  . SHOULDER OPEN ROTATOR CUFF REPAIR Right 2000  . TUBAL LIGATION  1978  . VAGAL NERVE STIMULATOR REMOVAL  2013   "got an infection in there" (06/01/2013)    There were no vitals filed for this visit.  Subjective Assessment - 07/07/18 1006    Subjective   Pt reports pain is improving.  I like the clay.    Pertinent History  PMH:  bipolar disorder, DM, hypotension, OSA, hx of L foreram    Patient Stated Goals  reduce pain in R hand    Currently in Pain?  Yes    Pain Score  5     Pain Location  Hand    Pain Orientation  Right    Pain Descriptors / Indicators  Aching;Dull    Pain Type  Surgical pain    Pain Onset  More than a month ago    Pain Frequency  Intermittent    Aggravating Factors   movement    Pain Relieving Factors  rest       Ultrasound x 53min to R palmar incision 24mhz, 0.8wts/cm2, 20% pulsed with no adverse reactions for scar tissue   Reviewed initial HEP.  Pt returned demo each with min v.c.        OT Short Term Goals - 06/18/18 1339      OT SHORT TERM GOAL #1   Title  Pt will be independent with initial HEP and scar massage.--check STGs 07/24/18    Time  4    Period  Weeks    Status  New  OT SHORT TERM GOAL #2   Title  Pt will be able to cut/slice food using RUE only.    Time  4    Period  Weeks    Status  New      OT SHORT TERM GOAL #3   Title  Pt will report pain less than or equal to 4/10 for ADLs.    Time  4    Period  Weeks    Status  New        OT Long Term Goals - 06/18/18 1341      OT LONG TERM GOAL #1   Title  Pt will be independent with updated HEP.--check LTGs 08/07/18    Time  6    Period  Weeks    Status  New      OT LONG TERM GOAL #2   Title  Pt will improve R lateral and 3point pinch strengths by at least 3lbs each.    Baseline  lateral 11lbs, 3point 10lbs    Time  8    Period  Weeks    Status  New      OT LONG TERM GOAL #3   Title  Pt will improve R grip strength by at least 5lbs to assist with opening containers.    Baseline  33lbs    Time  6    Period  Weeks    Status  New      OT LONG TERM GOAL #4   Title  Pt will be able to lift and carry 5lb bag of groceries at least 22feet with R hand.    Time  6    Period  Weeks    Status  New      OT LONG TERM GOAL #5   Title  Pt will report pain less than or equal to  3/10 for ADLs/IADLs.    Time  6    Period  Weeks    Status  New            Plan - 07/07/18 1010    Clinical Impression Statement  Pt is progressing towards goals with improving pain and activity tolerance.    Occupational Profile and client history currently impacting functional performance  Pt lives alone and was independent prior to surgery.  Currently pt is performing ADLs/IADLs mod I, but is not able to fully use dominant RUE for all activities.    Occupational performance deficits (Please refer to evaluation for details):  ADL's;IADL's;Leisure    Body Structure / Function / Physical Skills  ADL;Coordination;Endurance;UE functional use;Strength;FMC;Dexterity;Edema;ROM;Pain;Flexibility    Rehab Potential  Good    OT Frequency  2x / week    OT Duration  6 weeks    OT Treatment/Interventions  Self-care/ADL training;Electrical Stimulation;Therapeutic exercise;Patient/family education;Splinting;Neuromuscular education;Paraffin;Moist Heat;Fluidtherapy;Energy conservation;Scar mobilization;Therapeutic activities;Passive range of motion;Manual Therapy;DME and/or AE instruction;Contrast Bath;Ultrasound;Cryotherapy    Plan  continue with ultrasound, add wrist strengthening    Consulted and Agree with Plan of Care  Patient       Patient will benefit from skilled therapeutic intervention in order to improve the following deficits and impairments:  Body Structure / Function / Physical Skills  Visit Diagnosis: Muscle weakness (generalized)  Stiffness of right hand, not elsewhere classified  Pain in right hand  Other disturbances of skin sensation    Problem List Patient Active Problem List   Diagnosis Date Noted  . Allergic rhinitis 01/16/2017  . Legionella pneumonia ([place]) 01/09/2016  . Sepsis ([place]) 01/04/2016  .  AKI (acute kidney injury) ([place]) 01/04/2016  . Exertional dyspnea 08/29/2015  . Tobacco user 08/29/2015  . Obesity 08/29/2015  . OSA (obstructive sleep apnea)  08/13/2013  . CAP (community acquired pneumonia) 06/01/2013  . Chest pain, musculoskeletal 06/01/2013  . Bipolar 1 disorder ([place]) 06/01/2013  . Hypotension 06/01/2013  . Chronic pain 06/01/2013  . Diabetes ([place]) 06/01/2013    [facility] 07/07/2018, 10:14 AM  [facility] [address] [place], [place], [address] Phone: [phone]   Fax:  [phone]  Name: Melanie Reynolds MRN: [id number] Date of Birth: November 21, 1950   Vianne Bulls, OTR/L [facility] [address] [place], [place]  [address] [phone] phone [phone] 07/07/18 10:14 AM

## 2018-07-08 ENCOUNTER — Encounter: Payer: Self-pay | Admitting: Occupational Therapy

## 2018-07-08 ENCOUNTER — Ambulatory Visit: Payer: Medicare HMO | Admitting: Occupational Therapy

## 2018-07-08 DIAGNOSIS — M6281 Muscle weakness (generalized): Secondary | ICD-10-CM | POA: Diagnosis not present

## 2018-07-08 DIAGNOSIS — M79641 Pain in right hand: Secondary | ICD-10-CM

## 2018-07-08 DIAGNOSIS — R208 Other disturbances of skin sensation: Secondary | ICD-10-CM

## 2018-07-08 DIAGNOSIS — M25641 Stiffness of right hand, not elsewhere classified: Secondary | ICD-10-CM

## 2018-07-08 NOTE — Therapy (Signed)
[facility] [address] [place], [place], [address] Phone: [phone]   Fax:  [phone]  Occupational Therapy Treatment  Patient Details  Name: Melanie Reynolds MRN: [id number] Date of Birth: 1951/01/23 Referring Provider (OT): Dr. Ashok Pall   Encounter Date: 07/08/2018  OT End of Session - 07/08/18 1430    Visit Number  4    Number of Visits  13    Date for OT Re-Evaluation  08/17/18    Authorization Type  [employer] Medicare / Medicaid--awaiting [employer] auth (need progress note)    Authorization Time Period  auth 2/20-8/20/2020    Authorization - Visit Number  4    Authorization - Number of Visits  10    OT Start Time  1320    OT Stop Time  1403    OT Time Calculation (min)  43 min    Activity Tolerance  Patient tolerated treatment well    Behavior During Therapy  [facility] for tasks assessed/performed       Past Medical History:  Diagnosis Date  . Anxiety   . Arthritis    "knees, legs, back" (06/01/2013)  . Bipolar depression ([place])   . Cataract   . Chronic lower back pain   . Constipation, chronic   . Depression   . High cholesterol   . Irritable bowel syndrome (IBS)   . OSA (obstructive sleep apnea)   . Pneumonia 1970's; 1980's; 06/01/2013   "total of 3 times"  . Sleep apnea    cpap  . Type II diabetes mellitus ([place])     Past Surgical History:  Procedure Laterality Date  . ABDOMINAL HYSTERECTOMY  1982  . BACK SURGERY    . CHOLECYSTECTOMY  1990's  . FOREARM FRACTURE SURGERY Left 1999   "put in plates"  . IMPLANTATION VAGAL NERVE STIMULATOR  2013  . POSTERIOR LUMBAR FUSION  2000's  . SHOULDER OPEN ROTATOR CUFF REPAIR Right 2000  . TUBAL LIGATION  1978  . VAGAL NERVE STIMULATOR REMOVAL  2013   "got an infection in there" (06/01/2013)    There were no vitals filed for this visit.  Subjective Assessment - 07/08/18 1332    Subjective   My arm hurt last night    Pertinent History  PMH:  bipolar disorder,  DM, hypotension, OSA, hx of L foreram    Patient Stated Goals  reduce pain in R hand    Currently in Pain?  Yes    Pain Score  3     Pain Location  Hand    Pain Orientation  Right    Pain Descriptors / Indicators  Aching    Pain Type  Surgical pain    Pain Onset  More than a month ago    Pain Frequency  Intermittent    Aggravating Factors   movement/full flexion    Pain Relieving Factors  rest, CBD oil         Fludio x 58min to right hand for pain, stiffness, desensitization with no adverse reactions.    Tendon glides, isolated MP flex/ext   Ultrasound x 4min to R palmar incision 78mhz, 0.8wts/cm2, 20% pulsed with no adverse reactions for scar tissue   Yellow putty exercises for grip and individual finger pinch.     OT Education - 07/08/18 1429    Education Details  Added prayer stretch, wrist flex/ext with 1lb weight    Person(s) Educated  Patient    Methods  Explanation;Demonstration;Verbal cues;Handout    Comprehension  Verbalized understanding;Returned demonstration;Verbal cues required       OT Short Term Goals - 06/18/18 1339      OT SHORT TERM GOAL #1   Title  Pt will be independent with initial HEP and scar massage.--check STGs 07/24/18    Time  4    Period  Weeks    Status  New      OT SHORT TERM GOAL #2   Title  Pt will be able to cut/slice food using RUE only.    Time  4    Period  Weeks    Status  New      OT SHORT TERM GOAL #3   Title  Pt will report pain less than or equal to 4/10 for ADLs.    Time  4    Period  Weeks    Status  New        OT Long Term Goals - 06/18/18 1341      OT LONG TERM GOAL #1   Title  Pt will be independent with updated HEP.--check LTGs 08/07/18    Time  6    Period  Weeks    Status  New      OT LONG TERM GOAL #2   Title  Pt will improve R lateral and 3point pinch strengths by at least 3lbs each.    Baseline  lateral 11lbs, 3point 10lbs    Time  8    Period  Weeks    Status  New      OT LONG TERM GOAL #3    Title  Pt will improve R grip strength by at least 5lbs to assist with opening containers.    Baseline  33lbs    Time  6    Period  Weeks    Status  New      OT LONG TERM GOAL #4   Title  Pt will be able to lift and carry 5lb bag of groceries at least 45feet with R hand.    Time  6    Period  Weeks    Status  New      OT LONG TERM GOAL #5   Title  Pt will report pain less than or equal to 3/10 for ADLs/IADLs.    Time  6    Period  Weeks    Status  New            Plan - 07/08/18 1432    Clinical Impression Statement  Pt is progressing towards goals with improving activity tolerance and less stiffness.    Occupational Profile and client history currently impacting functional performance  Pt lives alone and was independent prior to surgery.  Currently pt is performing ADLs/IADLs mod I, but is not able to fully use dominant RUE for all activities.    Occupational performance deficits (Please refer to evaluation for details):  ADL's;IADL's;Leisure    Body Structure / Function / Physical Skills  ADL;Coordination;Endurance;UE functional use;Strength;FMC;Dexterity;Edema;ROM;Pain;Flexibility    Rehab Potential  Good    OT Frequency  2x / week    OT Duration  6 weeks    OT Treatment/Interventions  Self-care/ADL training;Electrical Stimulation;Therapeutic exercise;Patient/family education;Splinting;Neuromuscular education;Paraffin;Moist Heat;Fluidtherapy;Energy conservation;Scar mobilization;Therapeutic activities;Passive range of motion;Manual Therapy;DME and/or AE instruction;Contrast Bath;Ultrasound;Cryotherapy    Plan  continue with ultrasound, tendon glides, gentle strengthening    Consulted and Agree with Plan of Care  Patient       Patient will benefit from skilled therapeutic intervention in order to improve the  following deficits and impairments:  Body Structure / Function / Physical Skills  Visit Diagnosis: Muscle weakness (generalized)  Stiffness of right hand, not  elsewhere classified  Pain in right hand  Other disturbances of skin sensation    Problem List Patient Active Problem List   Diagnosis Date Noted  . Allergic rhinitis 01/16/2017  . Legionella pneumonia ([place]) 01/09/2016  . Sepsis ([place]) 01/04/2016  . AKI (acute kidney injury) ([place]) 01/04/2016  . Exertional dyspnea 08/29/2015  . Tobacco user 08/29/2015  . Obesity 08/29/2015  . OSA (obstructive sleep apnea) 08/13/2013  . CAP (community acquired pneumonia) 06/01/2013  . Chest pain, musculoskeletal 06/01/2013  . Bipolar 1 disorder ([place]) 06/01/2013  . Hypotension 06/01/2013  . Chronic pain 06/01/2013  . Diabetes ([place]) 06/01/2013    [facility] 07/08/2018, 2:34 PM  [facility] [address] [place], [place], [address] Phone: [phone]   Fax:  [phone]  Name: Melanie Reynolds MRN: [id number] Date of Birth: 12/28/1950   Vianne Bulls, OTR/L [facility] [address] [place], [place]  [address] [phone] phone [phone] 07/08/18 2:35 PM

## 2018-07-08 NOTE — Patient Instructions (Signed)
Wrist Flexion: Resisted   With right palm up, 1 pound weight in hand, bend wrist up. Return slowly. Repeat 10 times per set.  Do 2 sessions per day.  Wrist Extension: Resisted   With right palm down, 1 pound weight in hand, bend wrist up. Return slowly. Repeat 10 times per set. Do 2 sessions per day.   Composite Extension (Passive Flexor Stretch)    Sitting with elbows on table and palms together, slowly lower wrists toward table until stretch is felt. Be sure to keep palms together throughout stretch. Hold 20 seconds. Relax. Repeat 5 times. Do 1-2 sessions per day.  Copyright  VHI. All rights reserved.

## 2018-07-13 ENCOUNTER — Ambulatory Visit: Payer: Medicare HMO | Admitting: Occupational Therapy

## 2018-07-13 ENCOUNTER — Encounter: Payer: Self-pay | Admitting: Occupational Therapy

## 2018-07-13 DIAGNOSIS — M79641 Pain in right hand: Secondary | ICD-10-CM

## 2018-07-13 DIAGNOSIS — M25641 Stiffness of right hand, not elsewhere classified: Secondary | ICD-10-CM

## 2018-07-13 DIAGNOSIS — M6281 Muscle weakness (generalized): Secondary | ICD-10-CM | POA: Diagnosis not present

## 2018-07-13 DIAGNOSIS — R208 Other disturbances of skin sensation: Secondary | ICD-10-CM

## 2018-07-13 NOTE — Therapy (Signed)
[facility] [address] [employer], [place], [address] Phone: [phone]   Fax:  [phone]  Occupational Therapy Treatment  Patient Details  Name: Melanie Reynolds MRN: [id number] Date of Birth: [birth year] Referring Provider (OT): Dr. Ashok Pall   Encounter Date: 07/13/2018  OT End of Session - 07/13/18 1508    Visit Number  5    Number of Visits  13    Date for OT Re-Evaluation  08/17/18    Authorization Type  [employer] Medicare / Medicaid--awaiting [employer] auth (need progress note)    Authorization Time Period  auth 2/20-8/20/2020    Authorization - Visit Number  5    Authorization - Number of Visits  10    OT Start Time  1406    OT Stop Time  1445    OT Time Calculation (min)  39 min    Activity Tolerance  Patient tolerated treatment well    Behavior During Therapy  [facility] for tasks assessed/performed       Past Medical History:  Diagnosis Date  . Anxiety   . Arthritis    "knees, legs, back" (06/01/2013)  . Bipolar depression ([place])   . Cataract   . Chronic lower back pain   . Constipation, chronic   . Depression   . High cholesterol   . Irritable bowel syndrome (IBS)   . OSA (obstructive sleep apnea)   . Pneumonia 1970's; 1980's; 06/01/2013   "total of 3 times"  . Sleep apnea    cpap  . Type II diabetes mellitus ([place])     Past Surgical History:  Procedure Laterality Date  . ABDOMINAL HYSTERECTOMY  1982  . BACK SURGERY    . CHOLECYSTECTOMY  1990's  . FOREARM FRACTURE SURGERY Left 1999   "put in plates"  . IMPLANTATION VAGAL NERVE STIMULATOR  2013  . POSTERIOR LUMBAR FUSION  2000's  . SHOULDER OPEN ROTATOR CUFF REPAIR Right 2000  . TUBAL LIGATION  1978  . VAGAL NERVE STIMULATOR REMOVAL  2013   "got an infection in there" (06/01/2013)    There were no vitals filed for this visit.  Subjective Assessment - 07/13/18 1434    Subjective   It's not bad today (arm)    Pertinent History  PMH:  bipolar  disorder, DM, hypotension, OSA, hx of L foreram    Patient Stated Goals  reduce pain in R hand    Currently in Pain?  No/denies    Pain Onset  More than a month ago         Tendon glides, isolated MP flex/ext   Ultrasound x 51min to R palmar incision 20mhz, 0.8wts/cm2, 20% pulsed with no adverse reactions for scar tissue   Functional reaching to place/remove clothespins with 1-8lb resistance on vertical pole for incr pinch strength, min cueing for compensation  Picking up blocks with gripper set on level 1 (black spring) for sustained grip strength with min-mod difficulty.  Forearm gym x4 for incr wrist ROM with min cueing for compensation.  Wrist flex/ext with 1lb wt. With min cueing for wrist flexion        OT Short Term Goals - 07/13/18 1512      OT SHORT TERM GOAL #1   Title  Pt will be independent with initial HEP and scar massage.--check STGs 07/24/18    Time  4    Period  Weeks    Status  Achieved      OT SHORT TERM GOAL #2  Title  Pt will be able to cut/slice food using RUE only.    Time  4    Period  Weeks    Status  New      OT SHORT TERM GOAL #3   Title  Pt will report pain less than or equal to 4/10 for ADLs.    Time  4    Period  Weeks    Status  Achieved        OT Long Term Goals - 06/18/18 1341      OT LONG TERM GOAL #1   Title  Pt will be independent with updated HEP.--check LTGs 08/07/18    Time  6    Period  Weeks    Status  New      OT LONG TERM GOAL #2   Title  Pt will improve R lateral and 3point pinch strengths by at least 3lbs each.    Baseline  lateral 11lbs, 3point 10lbs    Time  8    Period  Weeks    Status  New      OT LONG TERM GOAL #3   Title  Pt will improve R grip strength by at least 5lbs to assist with opening containers.    Baseline  33lbs    Time  6    Period  Weeks    Status  New      OT LONG TERM GOAL #4   Title  Pt will be able to lift and carry 5lb bag of groceries at least 44feet with R hand.    Time  6     Period  Weeks    Status  New      OT LONG TERM GOAL #5   Title  Pt will report pain less than or equal to 3/10 for ADLs/IADLs.    Time  6    Period  Weeks    Status  New            Plan - 07/13/18 1508    Clinical Impression Statement  Pt is progressing towards goals with improving pain and incr use of R hand.    Occupational Profile and client history currently impacting functional performance  Pt lives alone and was independent prior to surgery.  Currently pt is performing ADLs/IADLs mod I, but is not able to fully use dominant RUE for all activities.    Occupational performance deficits (Please refer to evaluation for details):  ADL's;IADL's;Leisure    Body Structure / Function / Physical Skills  ADL;Coordination;Endurance;UE functional use;Strength;FMC;Dexterity;Edema;ROM;Pain;Flexibility    Rehab Potential  Good    OT Frequency  2x / week    OT Duration  6 weeks    OT Treatment/Interventions  Self-care/ADL training;Electrical Stimulation;Therapeutic exercise;Patient/family education;Splinting;Neuromuscular education;Paraffin;Moist Heat;Fluidtherapy;Energy conservation;Scar mobilization;Therapeutic activities;Passive range of motion;Manual Therapy;DME and/or AE instruction;Contrast Bath;Ultrasound;Cryotherapy    Plan  continue with ultrasound, tendon glides, gentle strengthening    Consulted and Agree with Plan of Care  Patient       Patient will benefit from skilled therapeutic intervention in order to improve the following deficits and impairments:  Body Structure / Function / Physical Skills  Visit Diagnosis: Muscle weakness (generalized)  Stiffness of right hand, not elsewhere classified  Pain in right hand  Other disturbances of skin sensation    Problem List Patient Active Problem List   Diagnosis Date Noted  . Allergic rhinitis 01/16/2017  . Legionella pneumonia ([place]) 01/09/2016  . Sepsis ([place]) 01/04/2016  . AKI (acute kidney  injury) ([place]) 01/04/2016  .  Exertional dyspnea 08/29/2015  . Tobacco user 08/29/2015  . Obesity 08/29/2015  . OSA (obstructive sleep apnea) 08/13/2013  . CAP (community acquired pneumonia) 06/01/2013  . Chest pain, musculoskeletal 06/01/2013  . Bipolar 1 disorder ([place]) 06/01/2013  . Hypotension 06/01/2013  . Chronic pain 06/01/2013  . Diabetes ([place]) 06/01/2013    [facility] 07/13/2018, 3:12 PM  [facility] [address] [place], [place], [address] Phone: [phone]   Fax:  [phone]  Name: Melanie Reynolds MRN: [id number] Date of Birth: 1951/03/25   Vianne Bulls, OTR/L [facility] [address] [place], [place]  [address] [phone] phone [phone] 07/13/18 3:12 PM

## 2018-07-16 ENCOUNTER — Ambulatory Visit: Payer: Medicare HMO | Admitting: Occupational Therapy

## 2018-07-20 ENCOUNTER — Ambulatory Visit: Payer: Medicare HMO | Admitting: Occupational Therapy

## 2018-07-20 ENCOUNTER — Telehealth: Payer: Self-pay | Admitting: Occupational Therapy

## 2018-07-20 NOTE — Telephone Encounter (Signed)
Pt. was contacted today regarding the temporary closing of OP Rehab Services due to Covid-19.  Therapist discussed:  Continued HEP.  Patient is interested in further information for an e-visit, virtual check in, or telehealth visit, if those services become available.    OP Rehabilitation Services will follow up with patients when we are able to resume care.  Melanie Reynolds, OTR/L [facility] [address] [place], [place]  [address] [phone] phone [phone] 07/20/18 12:17 [name] [address] [place],   [address] Phone:  [phone] Fax:  [phone] \

## 2018-07-23 ENCOUNTER — Ambulatory Visit: Payer: Medicare HMO | Admitting: Occupational Therapy

## 2018-07-27 ENCOUNTER — Encounter: Payer: Medicare HMO | Admitting: Occupational Therapy

## 2018-07-30 ENCOUNTER — Encounter: Payer: Medicare HMO | Admitting: Occupational Therapy

## 2018-08-03 ENCOUNTER — Encounter: Payer: Medicare HMO | Admitting: Occupational Therapy

## 2018-08-04 ENCOUNTER — Telehealth: Payer: Self-pay | Admitting: Physical Therapy

## 2018-08-04 NOTE — Telephone Encounter (Signed)
Melanie Reynolds was contacted today regarding temporary reduction of Outpatient Neuro Rehabilitation Services due to concerns for community transmission of COVID-19.  Patient identity was verified.  Assessed if patient needed to be seen in person by clinician (recent fall or acute injury that requires hands on assessment and advice, change in diet order, post-surgical, special cases, etc.).    Patient did not have an acute/special need that requires in person visit. Proceeded with phone call.  Therapist advised the patient to continue to perform her HEP and assured she had no unanswered questions or concerns at this time.  The patient expressed interest in being contacted for an E-Visit, virtual check in, or Telehealth visit to continue their plan of care, when those services become available.  Outpatient [facility] Rehabilitation Services will follow up with patient at that time.  Patient is aware we can be reached by telephone during limited business hours in the meantime.   Rico Junker, PT, DPT 08/04/18    12:34 PM

## 2018-08-06 ENCOUNTER — Encounter: Payer: Medicare HMO | Admitting: Occupational Therapy

## 2018-08-11 ENCOUNTER — Other Ambulatory Visit: Payer: Self-pay | Admitting: Acute Care

## 2018-08-11 ENCOUNTER — Encounter: Payer: Medicare HMO | Admitting: Occupational Therapy

## 2018-08-11 DIAGNOSIS — M47816 Spondylosis without myelopathy or radiculopathy, lumbar region: Secondary | ICD-10-CM

## 2018-08-11 DIAGNOSIS — M5136 Other intervertebral disc degeneration, lumbar region: Secondary | ICD-10-CM

## 2018-08-13 ENCOUNTER — Ambulatory Visit: Payer: Medicare HMO | Admitting: Occupational Therapy

## 2018-08-28 ENCOUNTER — Other Ambulatory Visit: Payer: Self-pay | Admitting: Gastroenterology

## 2018-09-11 ENCOUNTER — Ambulatory Visit: Payer: Medicare HMO | Attending: Neurosurgery | Admitting: Occupational Therapy

## 2018-09-11 ENCOUNTER — Other Ambulatory Visit: Payer: Self-pay

## 2018-09-11 DIAGNOSIS — M25641 Stiffness of right hand, not elsewhere classified: Secondary | ICD-10-CM

## 2018-09-11 DIAGNOSIS — R208 Other disturbances of skin sensation: Secondary | ICD-10-CM | POA: Diagnosis present

## 2018-09-11 DIAGNOSIS — M79641 Pain in right hand: Secondary | ICD-10-CM | POA: Diagnosis present

## 2018-09-11 DIAGNOSIS — M6281 Muscle weakness (generalized): Secondary | ICD-10-CM

## 2018-09-11 NOTE — Therapy (Signed)
[facility] [address] [place], [place], [address] Phone: [phone]   Fax:  [phone]  Occupational Therapy Treatment  Patient Details  Name: Melanie Reynolds MRN: [id number] Date of Birth: 07-08-1950 Referring Provider (OT): Dr. Ashok Pall   Encounter Date: 09/11/2018  OT End of Session - 09/11/18 1027    Visit Number  6    Number of Visits  14    Date for OT Re-Evaluation  10/11/18    Authorization Type  [employer] Medicare / Medicaid--awaiting [employer] auth (need progress note)    Authorization - Visit Number  6    Authorization - Number of Visits  10    OT Start Time  1018    OT Stop Time  1100    OT Time Calculation (min)  42 min    Activity Tolerance  Patient tolerated treatment well    Behavior During Therapy  [facility] for tasks assessed/performed       Past Medical History:  Diagnosis Date  . Anxiety   . Arthritis    "knees, legs, back" (06/01/2013)  . Bipolar depression ([place])   . Cataract   . Chronic lower back pain   . Constipation, chronic   . Depression   . High cholesterol   . Irritable bowel syndrome (IBS)   . OSA (obstructive sleep apnea)   . Pneumonia 1970's; 1980's; 06/01/2013   "total of 3 times"  . Sleep apnea    cpap  . Type II diabetes mellitus ([place])     Past Surgical History:  Procedure Laterality Date  . ABDOMINAL HYSTERECTOMY  1982  . BACK SURGERY    . CHOLECYSTECTOMY  1990's  . FOREARM FRACTURE SURGERY Left 1999   "put in plates"  . IMPLANTATION VAGAL NERVE STIMULATOR  2013  . POSTERIOR LUMBAR FUSION  2000's  . SHOULDER OPEN ROTATOR CUFF REPAIR Right 2000  . TUBAL LIGATION  1978  . VAGAL NERVE STIMULATOR REMOVAL  2013   "got an infection in there" (06/01/2013)    There were no vitals filed for this visit.  Subjective Assessment - 09/11/18 1026    Subjective   Pt reports mild pain    Pertinent History  PMH:  bipolar disorder, DM, hypotension, OSA, hx of L foreram    Patient  Stated Goals  reduce pain in R hand    Currently in Pain?  Yes    Pain Score  2     Pain Location  Hand    Pain Orientation  Right    Pain Descriptors / Indicators  Aching    Pain Type  Surgical pain    Pain Onset  More than a month ago    Pain Frequency  Intermittent    Aggravating Factors   movment    Pain Relieving Factors  rest    Multiple Pain Sites  No             Treatment: Pt was seen in the clinic with universal masking in place and reduced number of patients and staff in the clinic due to COVID-19. Fluidotherapy x 10 mins to RUE, for pain and stiffness, no adverse reactions. Checked progress towards goals. [place] to palmar incsion 45mhz, 0.8 w/cm2, 20% x 8 mins for scar mobilization Upgraded putty HEP to red  pt perfromed exercises for grip and tip pinch , min v.c Wrist flexion/ extension x 10 each direction with 1 lbs weight min v.c Tendon gliding exercises x 10 reps min v.c  OT Short Term Goals - 09/11/18 1046      OT SHORT TERM GOAL #1   Title  Pt will be independent with initial HEP and scar massage.--check STGs 07/24/18    Status  Achieved      OT SHORT TERM GOAL #2   Title  Pt will be able to cut/slice food using RUE only.    Status  Achieved      OT SHORT TERM GOAL #3   Title  Pt will report pain less than or equal to 4/10 for ADLs.    Status  Achieved   2/10       OT Long Term Goals - 09/11/18 1047      OT LONG TERM GOAL #1   Title  Pt will be independent with updated HEP.--check LTGs 08/07/18    Time  4    Period  Weeks    Status  On-going      OT LONG TERM GOAL #2   Title  Pt will improve R lateral and 3point pinch strengths by at least 3lbs each.    Baseline  lateral 11lbs, 3point 10lbs    Time  4    Period  Weeks    Status  On-going   lateral 12.5, 3 pt-11     OT LONG TERM GOAL #3   Title  Pt will improve R grip strength by at least 5lbs to assist with opening containers.    Baseline  33lbs    Time  4     Period  Weeks    Status  Achieved   38 lbs     OT LONG TERM GOAL #4   Title  Pt will be able to lift and carry 5lb bag of groceries at least 53feet with R hand.    Time  4    Period  Weeks    Status  On-going      OT LONG TERM GOAL #5   Title  Pt will report pain less than or equal to 3/10 for ADLs/IADLs.    Time  4    Period  Weeks    Status  On-going            Plan - 09/11/18 1431    Clinical Impression Statement  Pt is progressing towards goals with improving pain and incr use of R hand. Pt had a delay in her care due to the reduction in Neuro rehab clinic hours. Pt returns today and she can benefit from additional skilled occupational therapy to maximize functional use of her RUE.    Occupational Profile and client history currently impacting functional performance  Pt lives alone and was independent prior to surgery.  Currently pt is performing ADLs/IADLs mod I, but is not able to fully use dominant RUE for all activities.    Occupational performance deficits (Please refer to evaluation for details):  ADL's;IADL's;Leisure    Body Structure / Function / Physical Skills  ADL;Coordination;Endurance;UE functional use;Strength;FMC;Dexterity;Edema;ROM;Pain;Flexibility    Rehab Potential  Good    Clinical Decision Making  Limited treatment options, no task modification necessary    OT Frequency  2x / week    OT Duration  4 weeks    OT Treatment/Interventions  Self-care/ADL training;Electrical Stimulation;Therapeutic exercise;Patient/family education;Splinting;Neuromuscular education;Paraffin;Moist Heat;Fluidtherapy;Energy conservation;Scar mobilization;Therapeutic activities;Passive range of motion;Manual Therapy;DME and/or AE instruction;Contrast Bath;Ultrasound;Cryotherapy    Plan  continue with ultrasound, tendon glides, gentle strengthening- pt is only scheduled for 1 more visits, discuss d/c vs schedule more visits  Consulted and Agree with Plan of Care  Patient        Patient will benefit from skilled therapeutic intervention in order to improve the following deficits and impairments:  Body Structure / Function / Physical Skills  Visit Diagnosis: Muscle weakness (generalized) - Plan: Ot plan of care cert/re-cert  Stiffness of right hand, not elsewhere classified - Plan: Ot plan of care cert/re-cert  Pain in right hand - Plan: Ot plan of care cert/re-cert  Other disturbances of skin sensation - Plan: Ot plan of care cert/re-cert    Problem List Patient Active Problem List   Diagnosis Date Noted  . Allergic rhinitis 01/16/2017  . Legionella pneumonia ([place]) 01/09/2016  . Sepsis ([place]) 01/04/2016  . AKI (acute kidney injury) ([place]) 01/04/2016  . Exertional dyspnea 08/29/2015  . Tobacco user 08/29/2015  . Obesity 08/29/2015  . OSA (obstructive sleep apnea) 08/13/2013  . CAP (community acquired pneumonia) 06/01/2013  . Chest pain, musculoskeletal 06/01/2013  . Bipolar 1 disorder ([place]) 06/01/2013  . Hypotension 06/01/2013  . Chronic pain 06/01/2013  . Diabetes ([place]) 06/01/2013    RINE,KATHRYN 09/11/2018, 2:39 PM  [facility] [address] [place], [place], [address] Phone: [phone]   Fax:  [phone]  Name: Melanie Reynolds MRN: [id number] Date of Birth: 08/25/1950

## 2018-09-15 ENCOUNTER — Ambulatory Visit: Payer: Medicare HMO | Admitting: Occupational Therapy

## 2018-09-15 ENCOUNTER — Encounter: Payer: Self-pay | Admitting: Occupational Therapy

## 2018-09-15 ENCOUNTER — Other Ambulatory Visit: Payer: Self-pay

## 2018-09-15 DIAGNOSIS — M25641 Stiffness of right hand, not elsewhere classified: Secondary | ICD-10-CM

## 2018-09-15 DIAGNOSIS — M79641 Pain in right hand: Secondary | ICD-10-CM

## 2018-09-15 DIAGNOSIS — M6281 Muscle weakness (generalized): Secondary | ICD-10-CM

## 2018-09-15 DIAGNOSIS — R208 Other disturbances of skin sensation: Secondary | ICD-10-CM

## 2018-09-15 NOTE — Therapy (Signed)
[facility] [address] [place], [place], [address] Phone: [phone]   Fax:  [phone]  Occupational Therapy Treatment  Patient Details  Name: Melanie Reynolds MRN: [id number] Date of Birth: 09/05/1950 Referring Provider (OT): Dr. Ashok Pall   Encounter Date: 09/15/2018  OT End of Session - 09/15/18 1539    Visit Number  7    Number of Visits  14    Date for OT Re-Evaluation  10/11/18    Authorization Type  [employer] Medicare / Medicaid--awaiting [employer] auth (need progress note)    Authorization Time Period  auth 2/20-8/20/2020    Authorization - Visit Number  7    Authorization - Number of Visits  10    OT Start Time  6962    OT Stop Time  1615    OT Time Calculation (min)  42 min    Activity Tolerance  Patient tolerated treatment well    Behavior During Therapy  [facility] for tasks assessed/performed       Past Medical History:  Diagnosis Date  . Anxiety   . Arthritis    "knees, legs, back" (06/01/2013)  . Bipolar depression ([place])   . Cataract   . Chronic lower back pain   . Constipation, chronic   . Depression   . High cholesterol   . Irritable bowel syndrome (IBS)   . OSA (obstructive sleep apnea)   . Pneumonia 1970's; 1980's; 06/01/2013   "total of 3 times"  . Sleep apnea    cpap  . Type II diabetes mellitus ([place])     Past Surgical History:  Procedure Laterality Date  . ABDOMINAL HYSTERECTOMY  1982  . BACK SURGERY    . CHOLECYSTECTOMY  1990's  . FOREARM FRACTURE SURGERY Left 1999   "put in plates"  . IMPLANTATION VAGAL NERVE STIMULATOR  2013  . POSTERIOR LUMBAR FUSION  2000's  . SHOULDER OPEN ROTATOR CUFF REPAIR Right 2000  . TUBAL LIGATION  1978  . VAGAL NERVE STIMULATOR REMOVAL  2013   "got an infection in there" (06/01/2013)    There were no vitals filed for this visit.  Subjective Assessment - 09/15/18 1535    Subjective   Pt reports mild pain    Pertinent History  PMH:  bipolar disorder,  DM, hypotension, OSA, hx of L foreram    Patient Stated Goals  reduce pain in R hand    Currently in Pain?  Yes    Pain Score  4     Pain Location  Shoulder    Pain Orientation  Left    Pain Descriptors / Indicators  Aching    Pain Type  Chronic pain    Pain Onset  More than a month ago    Pain Frequency  Intermittent    Aggravating Factors   movement    Pain Relieving Factors  rest, OTC pain meds         CLINIC OPERATION CHANGES: [facility] is operating at a low capacity due to COVID-19.  The patient was brought into the clinic for evaluation and/or treatment following universal masking by staff, patient masking, social distancing, and <10 people in the clinic.  The patient's COVID risk of complications score is 3.    Fluidotherapy x 10 mins to RUE, for pain and stiffness, no adverse reactions.  Tendon gliding exercises x 10 reps min v.c  Ultrasound to palmar incsion 32mhz, 0.8 w/cm2, 20% x 8 mins for scar mobilization   Picking  up blocks with gripper set on level 2 (black spring) for sustained grip strength with mod difficulty, completed approx 75%, but stopped due to R shoulder pain (despite lowered surface).    Wrist flexion/ extension x 15 each direction with 2 lbs weight min v.c  Simulated carrying 5lb bag of groceries approx 20 feet.  Pt able to do so without pain.       OT Short Term Goals - 09/11/18 1046      OT SHORT TERM GOAL #1   Title  Pt will be independent with initial HEP and scar massage.--check STGs 07/24/18    Status  Achieved      OT SHORT TERM GOAL #2   Title  Pt will be able to cut/slice food using RUE only.    Status  Achieved      OT SHORT TERM GOAL #3   Title  Pt will report pain less than or equal to 4/10 for ADLs.    Status  Achieved   2/10       OT Long Term Goals - 09/15/18 1549      OT LONG TERM GOAL #1   Title  Pt will be independent with updated HEP.--check LTGs 08/07/18    Time  4    Period  Weeks     Status  On-going      OT LONG TERM GOAL #2   Title  Pt will improve R lateral and 3point pinch strengths by at least 3lbs each.    Baseline  lateral 11lbs, 3point 10lbs    Time  4    Period  Weeks    Status  On-going   lateral 12.5, 3 pt-11     OT LONG TERM GOAL #3   Title  Pt will improve R grip strength by at least 5lbs to assist with opening containers.    Baseline  33lbs    Time  4    Period  Weeks    Status  Achieved   38 lbs.  09/15/18:  42lbs     OT LONG TERM GOAL #4   Title  Pt will be able to lift and carry 5lb bag of groceries at least 22feet with R hand.    Time  4    Period  Weeks    Status  Achieved      OT LONG TERM GOAL #5   Title  Pt will report pain less than or equal to 3/10 for ADLs/IADLs.    Time  4    Period  Weeks    Status  Achieved            Plan - 09/15/18 1540    Clinical Impression Statement  Pt is progressing towards goals and demo improved R grip strength today.  Chronic R shoulder pain was a limiting factor today.      Occupational Profile and client history currently impacting functional performance  Pt lives alone and was independent prior to surgery.  Currently pt is performing ADLs/IADLs mod I, but is not able to fully use dominant RUE for all activities.    Occupational performance deficits (Please refer to evaluation for details):  ADL's;IADL's;Leisure    Body Structure / Function / Physical Skills  ADL;Coordination;Endurance;UE functional use;Strength;FMC;Dexterity;Edema;ROM;Pain;Flexibility    Rehab Potential  Good    Clinical Decision Making  Limited treatment options, no task modification necessary    OT Frequency  2x / week    OT Duration  4 weeks  OT Treatment/Interventions  Self-care/ADL training;Electrical Stimulation;Therapeutic exercise;Patient/family education;Splinting;Neuromuscular education;Paraffin;Moist Heat;Fluidtherapy;Energy conservation;Scar mobilization;Therapeutic activities;Passive range of motion;Manual  Therapy;DME and/or AE instruction;Contrast Bath;Ultrasound;Cryotherapy    Plan  schedule in approx 2 weeks to check strength and update HEP, anticipate possible d/c    Consulted and Agree with Plan of Care  Patient       Patient will benefit from skilled therapeutic intervention in order to improve the following deficits and impairments:  Body Structure / Function / Physical Skills  Visit Diagnosis: Muscle weakness (generalized)  Stiffness of right hand, not elsewhere classified  Pain in right hand  Other disturbances of skin sensation    Problem List Patient Active Problem List   Diagnosis Date Noted  . Allergic rhinitis 01/16/2017  . Legionella pneumonia ([place]) 01/09/2016  . Sepsis ([place]) 01/04/2016  . AKI (acute kidney injury) ([place]) 01/04/2016  . Exertional dyspnea 08/29/2015  . Tobacco user 08/29/2015  . Obesity 08/29/2015  . OSA (obstructive sleep apnea) 08/13/2013  . CAP (community acquired pneumonia) 06/01/2013  . Chest pain, musculoskeletal 06/01/2013  . Bipolar 1 disorder ([place]) 06/01/2013  . Hypotension 06/01/2013  . Chronic pain 06/01/2013  . Diabetes ([place]) 06/01/2013    [facility] 09/15/2018, 4:32 PM  [facility] [address] [place], [place], [address] Phone: [phone]   Fax:  [phone]  Name: Melanie Reynolds MRN: [id number] Date of Birth: 07/15/1950   Vianne Bulls, OTR/L [facility] [address] [place], [place]  [address] [phone] phone [phone] 09/15/18 4:32 PM

## 2018-09-28 ENCOUNTER — Other Ambulatory Visit: Payer: Self-pay | Admitting: Acute Care

## 2018-09-29 ENCOUNTER — Other Ambulatory Visit: Payer: Self-pay

## 2018-09-29 ENCOUNTER — Ambulatory Visit
Admission: RE | Admit: 2018-09-29 | Discharge: 2018-09-29 | Disposition: A | Discharge status: Home / Self Care | Payer: Medicare HMO | Source: Ambulatory Visit | Attending: Acute Care | Admitting: Acute Care

## 2018-09-29 DIAGNOSIS — M5136 Other intervertebral disc degeneration, lumbar region: Secondary | ICD-10-CM

## 2018-09-29 DIAGNOSIS — M47816 Spondylosis without myelopathy or radiculopathy, lumbar region: Secondary | ICD-10-CM

## 2018-09-29 MED ORDER — GADOBENATE DIMEGLUMINE 529 MG/ML IV SOLN
20.0000 mL | Freq: Once | INTRAVENOUS | Status: AC | PRN
  Administered 2018-09-29: 20 mL via INTRAVENOUS

## 2018-09-30 ENCOUNTER — Other Ambulatory Visit: Payer: Self-pay

## 2018-09-30 ENCOUNTER — Ambulatory Visit: Payer: Medicare HMO | Attending: Neurosurgery | Admitting: Occupational Therapy

## 2018-09-30 DIAGNOSIS — R208 Other disturbances of skin sensation: Secondary | ICD-10-CM

## 2018-09-30 DIAGNOSIS — M79641 Pain in right hand: Secondary | ICD-10-CM | POA: Diagnosis present

## 2018-09-30 DIAGNOSIS — M6281 Muscle weakness (generalized): Secondary | ICD-10-CM | POA: Insufficient documentation

## 2018-09-30 DIAGNOSIS — M25641 Stiffness of right hand, not elsewhere classified: Secondary | ICD-10-CM

## 2018-10-01 NOTE — Therapy (Addendum)
[facility] [address] [place], [place], [address] Phone: [phone]   Fax:  [phone]  Occupational Therapy Treatment  Patient Details  Name: Melanie Reynolds MRN: [id number] Date of Birth: May 15, 1950 Referring Provider (OT): Dr. Ashok Pall   Encounter Date: 09/30/2018  OT End of Session - 09/30/18 1416    Visit Number  8    Number of Visits  14    Date for OT Re-Evaluation  10/11/18    Authorization Time Period  auth 2/20-8/20/2020    Authorization - Visit Number  8    Authorization - Number of Visits  10    OT Start Time  0762    OT Stop Time  1445    OT Time Calculation (min)  42 min    Activity Tolerance  Patient tolerated treatment well    Behavior During Therapy  [facility] for tasks assessed/performed       Past Medical History:  Diagnosis Date  . Anxiety   . Arthritis    "knees, legs, back" (06/01/2013)  . Bipolar depression ([place])   . Cataract   . Chronic lower back pain   . Constipation, chronic   . Depression   . High cholesterol   . Irritable bowel syndrome (IBS)   . OSA (obstructive sleep apnea)   . Pneumonia 1970's; 1980's; 06/01/2013   "total of 3 times"  . Sleep apnea    cpap  . Type II diabetes mellitus ([place])     Past Surgical History:  Procedure Laterality Date  . ABDOMINAL HYSTERECTOMY  1982  . BACK SURGERY    . CHOLECYSTECTOMY  1990's  . FOREARM FRACTURE SURGERY Left 1999   "put in plates"  . IMPLANTATION VAGAL NERVE STIMULATOR  2013  . POSTERIOR LUMBAR FUSION  2000's  . SHOULDER OPEN ROTATOR CUFF REPAIR Right 2000  . TUBAL LIGATION  1978  . VAGAL NERVE STIMULATOR REMOVAL  2013   "got an infection in there" (06/01/2013)    There were no vitals filed for this visit.  Subjective Assessment - 09/30/18 1415    Currently in Pain?  Yes    Pain Score  2     Pain Location  Hand    Pain Orientation  Left    Pain Descriptors / Indicators  Aching    Pain Type  Chronic pain    Pain  Onset  More than a month ago    Pain Frequency  Intermittent    Aggravating Factors   movement    Pain Relieving Factors  rest, OTC    Multiple Pain Sites  No                CLINIC OPERATION CHANGES: Outpatient Neuro Rehab is open at lower capacity following universal masking, social distancing, and patient screening.  The patient's COVID risk of complications score is 3  Fluidotherapy x 10 mins to RUE, for pain and stiffness, no adverse reactions. Picking up blocks with gripper set on level 2 (black spring) for sustained grip strength with min difficulty,  Wrist flexion/ extension with 1 lbs weight each direction x 15 reps each. Graded clothespins for sustained pinch min difficulty. Pt reports red putty is going well and is resistive enough. Checked progress towards goals.   OCCUPATIONAL THERAPY DISCHARGE SUMMARY    Current functional level related to goals / functional outcomes: Pt met all goals.   Remaining deficits: Mild pain, mildly decreased strength   Education / Equipment: Pt was educated regarding  HEP and scar massage. Pt demonstrates understanding of all education.  Plan: Patient agrees to discharge.  Patient goals were met. Patient is being discharged due to meeting the stated rehab goals.  ?????               OT Short Term Goals - 09/30/18 1419      OT SHORT TERM GOAL #1   Title  Pt will be independent with initial HEP and scar massage.--check STGs 07/24/18    Status  Achieved      OT SHORT TERM GOAL #2   Title  Pt will be able to cut/slice food using RUE only.    Status  Achieved      OT SHORT TERM GOAL #3   Title  Pt will report pain less than or equal to 4/10 for ADLs.    Status  Achieved   2/10       OT Long Term Goals - 09/30/18 1419      OT LONG TERM GOAL #1   Title  Pt will be independent with updated HEP.--check LTGs 08/07/18    Time  4    Period  Weeks    Status  Achieved      OT LONG TERM GOAL #2   Title  Pt will  improve R lateral and 3point pinch strengths by at least 3lbs each.    Baseline  lateral 11lbs, 3point 10lbs    Time  4    Period  Weeks    Status  Achieved   13.5 lbs for 3 pt,  17 lbs for grip     OT LONG TERM GOAL #3   Title  Pt will improve R grip strength by at least 5lbs to assist with opening containers.    Baseline  33lbs    Time  4    Period  Weeks    Status  Achieved   38 lbs.  09/15/18:  42lbs     OT LONG TERM GOAL #4   Title  Pt will be able to lift and carry 5lb bag of groceries at least 11fet with R hand.    Time  4    Period  Weeks    Status  Achieved      OT LONG TERM GOAL #5   Title  Pt will report pain less than or equal to 3/10 for ADLs/IADLs.    Time  4    Period  Weeks    Status  Achieved            Plan - 10/01/18 1318    Clinical Impression Statement  Pt agrees with plans for dishcahrge. She has met all of her goals.    Occupational performance deficits (Please refer to evaluation for details):  ADL's;IADL's;Leisure    Body Structure / Function / Physical Skills  ADL;Coordination;Endurance;UE functional use;Strength;FMC;Dexterity;Edema;ROM;Pain;Flexibility    Rehab Potential  Good    OT Frequency  2x / week    OT Duration  4 weeks    OT Treatment/Interventions  Self-care/ADL training;Electrical Stimulation;Therapeutic exercise;Patient/family education;Splinting;Neuromuscular education;Paraffin;Moist Heat;Fluidtherapy;Energy conservation;Scar mobilization;Therapeutic activities;Passive range of motion;Manual Therapy;DME and/or AE instruction;Contrast Bath;Ultrasound;Cryotherapy    Consulted and Agree with Plan of Care  Patient       Patient will benefit from skilled therapeutic intervention in order to improve the following deficits and impairments:  Body Structure / Function / Physical Skills  Visit Diagnosis: Muscle weakness (generalized)  Stiffness of right hand, not elsewhere classified  Pain in right hand  Other disturbances of skin  sensation    Problem List Patient Active Problem List   Diagnosis Date Noted  . Allergic rhinitis 01/16/2017  . Legionella pneumonia ([place]) 01/09/2016  . Sepsis ([place]) 01/04/2016  . AKI (acute kidney injury) ([place]) 01/04/2016  . Exertional dyspnea 08/29/2015  . Tobacco user 08/29/2015  . Obesity 08/29/2015  . OSA (obstructive sleep apnea) 08/13/2013  . CAP (community acquired pneumonia) 06/01/2013  . Chest pain, musculoskeletal 06/01/2013  . Bipolar 1 disorder ([place]) 06/01/2013  . Hypotension 06/01/2013  . Chronic pain 06/01/2013  . Diabetes ([place]) 06/01/2013    Melanie Reynolds 10/01/2018, 1:19 PM  [facility] [address] [place], [place], [address] Phone: [phone]   Fax:  [phone]  Name: Melanie Reynolds MRN: [id number] Date of Birth: [birth year]

## 2018-11-16 ENCOUNTER — Other Ambulatory Visit: Payer: Self-pay

## 2018-11-16 MED ORDER — LUBIPROSTONE 24 MCG PO CAPS: ORAL_CAPSULE | ORAL | 3 refills | Status: DC

## 2018-11-16 NOTE — Telephone Encounter (Signed)
Patient up to date on her office visits so Amitiza refilled as pharmacy requested.

## 2018-12-12 ENCOUNTER — Other Ambulatory Visit: Payer: Self-pay

## 2018-12-12 ENCOUNTER — Encounter (HOSPITAL_COMMUNITY): Payer: Self-pay | Admitting: Emergency Medicine

## 2018-12-12 ENCOUNTER — Emergency Department (HOSPITAL_COMMUNITY): Payer: Medicare HMO

## 2018-12-12 ENCOUNTER — Observation Stay (HOSPITAL_COMMUNITY)
Admission: EM | Admit: 2018-12-12 | Discharge: 2018-12-14 | Disposition: A | Discharge status: Home / Self Care | Payer: Medicare HMO | Attending: Internal Medicine | Admitting: Internal Medicine

## 2018-12-12 DIAGNOSIS — E119 Type 2 diabetes mellitus without complications: Secondary | ICD-10-CM | POA: Diagnosis not present

## 2018-12-12 DIAGNOSIS — N281 Cyst of kidney, acquired: Secondary | ICD-10-CM | POA: Diagnosis not present

## 2018-12-12 DIAGNOSIS — F319 Bipolar disorder, unspecified: Secondary | ICD-10-CM | POA: Insufficient documentation

## 2018-12-12 DIAGNOSIS — Z7951 Long term (current) use of inhaled steroids: Secondary | ICD-10-CM | POA: Diagnosis not present

## 2018-12-12 DIAGNOSIS — K589 Irritable bowel syndrome without diarrhea: Secondary | ICD-10-CM | POA: Insufficient documentation

## 2018-12-12 DIAGNOSIS — G4733 Obstructive sleep apnea (adult) (pediatric): Secondary | ICD-10-CM | POA: Insufficient documentation

## 2018-12-12 DIAGNOSIS — E1169 Type 2 diabetes mellitus with other specified complication: Secondary | ICD-10-CM | POA: Diagnosis not present

## 2018-12-12 DIAGNOSIS — F419 Anxiety disorder, unspecified: Secondary | ICD-10-CM | POA: Diagnosis not present

## 2018-12-12 DIAGNOSIS — M1990 Unspecified osteoarthritis, unspecified site: Secondary | ICD-10-CM | POA: Insufficient documentation

## 2018-12-12 DIAGNOSIS — E785 Hyperlipidemia, unspecified: Secondary | ICD-10-CM | POA: Diagnosis not present

## 2018-12-12 DIAGNOSIS — Z981 Arthrodesis status: Secondary | ICD-10-CM | POA: Insufficient documentation

## 2018-12-12 DIAGNOSIS — D735 Infarction of spleen: Secondary | ICD-10-CM | POA: Diagnosis not present

## 2018-12-12 DIAGNOSIS — Z20828 Contact with and (suspected) exposure to other viral communicable diseases: Secondary | ICD-10-CM | POA: Diagnosis not present

## 2018-12-12 DIAGNOSIS — G8929 Other chronic pain: Secondary | ICD-10-CM | POA: Insufficient documentation

## 2018-12-12 DIAGNOSIS — Z79899 Other long term (current) drug therapy: Secondary | ICD-10-CM | POA: Diagnosis not present

## 2018-12-12 DIAGNOSIS — E669 Obesity, unspecified: Secondary | ICD-10-CM | POA: Insufficient documentation

## 2018-12-12 DIAGNOSIS — F17210 Nicotine dependence, cigarettes, uncomplicated: Secondary | ICD-10-CM | POA: Insufficient documentation

## 2018-12-12 DIAGNOSIS — I4891 Unspecified atrial fibrillation: Secondary | ICD-10-CM | POA: Insufficient documentation

## 2018-12-12 DIAGNOSIS — M47814 Spondylosis without myelopathy or radiculopathy, thoracic region: Secondary | ICD-10-CM | POA: Diagnosis not present

## 2018-12-12 DIAGNOSIS — M545 Low back pain: Secondary | ICD-10-CM | POA: Insufficient documentation

## 2018-12-12 DIAGNOSIS — Z6839 Body mass index (BMI) 39.0-39.9, adult: Secondary | ICD-10-CM | POA: Diagnosis not present

## 2018-12-12 DIAGNOSIS — I071 Rheumatic tricuspid insufficiency: Secondary | ICD-10-CM | POA: Insufficient documentation

## 2018-12-12 DIAGNOSIS — K760 Fatty (change of) liver, not elsewhere classified: Secondary | ICD-10-CM | POA: Diagnosis not present

## 2018-12-12 DIAGNOSIS — E7800 Pure hypercholesterolemia, unspecified: Secondary | ICD-10-CM | POA: Diagnosis not present

## 2018-12-12 DIAGNOSIS — Z791 Long term (current) use of non-steroidal anti-inflammatories (NSAID): Secondary | ICD-10-CM | POA: Insufficient documentation

## 2018-12-12 DIAGNOSIS — Z9049 Acquired absence of other specified parts of digestive tract: Secondary | ICD-10-CM | POA: Diagnosis not present

## 2018-12-12 LAB — URINALYSIS, ROUTINE W REFLEX MICROSCOPIC
Bilirubin Urine: NEGATIVE
Glucose, UA: NEGATIVE mg/dL
Hgb urine dipstick: NEGATIVE
Ketones, ur: NEGATIVE mg/dL
Leukocytes,Ua: NEGATIVE
Nitrite: NEGATIVE
Protein, ur: NEGATIVE mg/dL
Specific Gravity, Urine: 1.018 (ref 1.005–1.030)
pH: 8 (ref 5.0–8.0)

## 2018-12-12 LAB — CBC
HCT: 46.4 % — ABNORMAL HIGH (ref 36.0–46.0)
Hemoglobin: 14.2 g/dL (ref 12.0–15.0)
MCH: 28.9 pg (ref 26.0–34.0)
MCHC: 30.6 g/dL (ref 30.0–36.0)
MCV: 94.3 fL (ref 80.0–100.0)
Platelets: 264 10*3/uL (ref 150–400)
RBC: 4.92 MIL/uL (ref 3.87–5.11)
RDW: 15.1 % (ref 11.5–15.5)
WBC: 10.4 10*3/uL (ref 4.0–10.5)
nRBC: 0 % (ref 0.0–0.2)

## 2018-12-12 LAB — COMPREHENSIVE METABOLIC PANEL
ALT: 22 U/L (ref 0–44)
AST: 22 U/L (ref 15–41)
Albumin: 3.8 g/dL (ref 3.5–5.0)
Alkaline Phosphatase: 74 U/L (ref 38–126)
Anion gap: 9 (ref 5–15)
BUN: 14 mg/dL (ref 8–23)
CO2: 24 mmol/L (ref 22–32)
Calcium: 9.4 mg/dL (ref 8.9–10.3)
Chloride: 108 mmol/L (ref 98–111)
Creatinine, Ser: 0.93 mg/dL (ref 0.44–1.00)
GFR calc Af Amer: >60 mL/min (ref >60)
GFR calc non Af Amer: >60 mL/min (ref >60)
Glucose, Bld: 173 mg/dL — ABNORMAL HIGH (ref 70–99)
Potassium: 4.3 mmol/L (ref 3.5–5.1)
Sodium: 141 mmol/L (ref 135–145)
Total Bilirubin: 0.8 mg/dL (ref 0.3–1.2)
Total Protein: 7.1 g/dL (ref 6.5–8.1)

## 2018-12-12 LAB — LIPASE, BLOOD: Lipase: 36 U/L (ref 11–51)

## 2018-12-12 LAB — GLUCOSE, CAPILLARY
Glucose-Capillary: 57 mg/dL — ABNORMAL LOW (ref 70–99)
Glucose-Capillary: 83 mg/dL (ref 70–99)
Glucose-Capillary: 126 mg/dL — ABNORMAL HIGH (ref 70–99)

## 2018-12-12 LAB — SARS CORONAVIRUS 2 (TAT 6-24 HRS): SARS Coronavirus 2: NEGATIVE

## 2018-12-12 LAB — HEMOGLOBIN A1C
Hgb A1c MFr Bld: 6.2 % — ABNORMAL HIGH (ref 4.8–5.6)
Mean Plasma Glucose: 131.24 mg/dL

## 2018-12-12 MED ORDER — PANTOPRAZOLE SODIUM 40 MG PO TBEC
40.0000 mg | DELAYED_RELEASE_TABLET | Freq: Every day | ORAL | Status: DC
  Administered 2018-12-14: 40 mg via ORAL
  Filled 2018-12-12 (×2): qty 1

## 2018-12-12 MED ORDER — DIPHENHYDRAMINE HCL 25 MG PO CAPS: 25.0000 mg | ORAL_CAPSULE | Freq: Three times a day (TID) | ORAL | Status: DC | PRN

## 2018-12-12 MED ORDER — TIZANIDINE HCL 2 MG PO TABS: 4.0000 mg | ORAL_TABLET | Freq: Two times a day (BID) | ORAL | Status: DC | PRN

## 2018-12-12 MED ORDER — SODIUM CHLORIDE 0.9 % IV SOLN
INTRAVENOUS | Status: DC
  Administered 2018-12-12 – 2018-12-13 (×2): via INTRAVENOUS

## 2018-12-12 MED ORDER — ENOXAPARIN SODIUM 40 MG/0.4ML ~~LOC~~ SOLN
40.0000 mg | SUBCUTANEOUS | Status: DC
  Administered 2018-12-12 – 2018-12-13 (×2): 40 mg via SUBCUTANEOUS
  Filled 2018-12-12 (×2): qty 0.4

## 2018-12-12 MED ORDER — ONDANSETRON HCL 4 MG PO TABS: 4.0000 mg | ORAL_TABLET | Freq: Four times a day (QID) | ORAL | Status: DC | PRN

## 2018-12-12 MED ORDER — INSULIN ASPART 100 UNIT/ML ~~LOC~~ SOLN
0.0000 [IU] | Freq: Three times a day (TID) | SUBCUTANEOUS | Status: DC
  Administered 2018-12-14: 2 [IU] via SUBCUTANEOUS

## 2018-12-12 MED ORDER — LITHIUM CARBONATE 150 MG PO CAPS
150.0000 mg | ORAL_CAPSULE | Freq: Every day | ORAL | Status: DC
  Administered 2018-12-13 – 2018-12-14 (×2): 150 mg via ORAL
  Filled 2018-12-12 (×2): qty 1

## 2018-12-12 MED ORDER — SODIUM CHLORIDE 0.9% FLUSH: 3.0000 mL | Freq: Once | INTRAVENOUS | Status: DC

## 2018-12-12 MED ORDER — MORPHINE SULFATE (PF) 2 MG/ML IV SOLN: 2.0000 mg | INTRAVENOUS | Status: DC | PRN

## 2018-12-12 MED ORDER — ONDANSETRON HCL 4 MG/2ML IJ SOLN: 4.0000 mg | Freq: Four times a day (QID) | INTRAMUSCULAR | Status: DC | PRN

## 2018-12-12 MED ORDER — LORATADINE 10 MG PO TABS
10.0000 mg | ORAL_TABLET | Freq: Every day | ORAL | Status: DC
  Administered 2018-12-13 – 2018-12-14 (×2): 10 mg via ORAL
  Filled 2018-12-12 (×2): qty 1

## 2018-12-12 MED ORDER — INSULIN ASPART 100 UNIT/ML ~~LOC~~ SOLN: 0.0000 [IU] | Freq: Every day | SUBCUTANEOUS | Status: DC

## 2018-12-12 MED ORDER — FENTANYL CITRATE (PF) 100 MCG/2ML IJ SOLN
50.0000 ug | Freq: Once | INTRAMUSCULAR | Status: AC
  Administered 2018-12-12: 50 ug via INTRAVENOUS
  Filled 2018-12-12: qty 2

## 2018-12-12 MED ORDER — MECLIZINE HCL 12.5 MG PO TABS
12.5000 mg | ORAL_TABLET | Freq: Three times a day (TID) | ORAL | Status: DC | PRN
  Filled 2018-12-12: qty 1

## 2018-12-12 MED ORDER — ONDANSETRON HCL 4 MG/2ML IJ SOLN
4.0000 mg | Freq: Once | INTRAMUSCULAR | Status: AC
  Administered 2018-12-12: 4 mg via INTRAVENOUS
  Filled 2018-12-12: qty 2

## 2018-12-12 MED ORDER — SIMVASTATIN 20 MG PO TABS
10.0000 mg | ORAL_TABLET | Freq: Every day | ORAL | Status: DC
  Administered 2018-12-13 – 2018-12-14 (×2): 10 mg via ORAL
  Filled 2018-12-12 (×2): qty 1

## 2018-12-12 MED ORDER — ALBUTEROL SULFATE (2.5 MG/3ML) 0.083% IN NEBU: 2.5000 mg | INHALATION_SOLUTION | Freq: Four times a day (QID) | RESPIRATORY_TRACT | Status: DC | PRN

## 2018-12-12 MED ORDER — OXYCODONE HCL 5 MG PO TABS
15.0000 mg | ORAL_TABLET | Freq: Four times a day (QID) | ORAL | Status: DC | PRN
  Administered 2018-12-12: 15 mg via ORAL
  Filled 2018-12-12: qty 3

## 2018-12-12 MED ORDER — PREGABALIN 75 MG PO CAPS
150.0000 mg | ORAL_CAPSULE | Freq: Three times a day (TID) | ORAL | Status: DC
  Administered 2018-12-12 – 2018-12-14 (×5): 150 mg via ORAL
  Filled 2018-12-12 (×5): qty 2

## 2018-12-12 MED ORDER — FLUTICASONE PROPIONATE 50 MCG/ACT NA SUSP
2.0000 | Freq: Every day | NASAL | Status: DC
  Filled 2018-12-12: qty 16

## 2018-12-12 MED ORDER — IOHEXOL 300 MG/ML  SOLN
100.0000 mL | Freq: Once | INTRAMUSCULAR | Status: AC | PRN
  Administered 2018-12-12: 100 mL via INTRAVENOUS

## 2018-12-12 MED ORDER — FUROSEMIDE 20 MG PO TABS
20.0000 mg | ORAL_TABLET | Freq: Every day | ORAL | Status: DC
  Administered 2018-12-13 – 2018-12-14 (×2): 20 mg via ORAL
  Filled 2018-12-12 (×2): qty 1

## 2018-12-12 MED ORDER — ALBUTEROL SULFATE (2.5 MG/3ML) 0.083% IN NEBU
2.5000 mg | INHALATION_SOLUTION | RESPIRATORY_TRACT | Status: DC
  Administered 2018-12-12: 2.5 mg via RESPIRATORY_TRACT
  Filled 2018-12-12: qty 3

## 2018-12-12 MED ORDER — MORPHINE SULFATE (PF) 4 MG/ML IV SOLN
6.0000 mg | Freq: Once | INTRAVENOUS | Status: DC
  Filled 2018-12-12: qty 2

## 2018-12-12 MED ORDER — FAMOTIDINE 10 MG PO TABS
10.0000 mg | ORAL_TABLET | Freq: Two times a day (BID) | ORAL | Status: DC
  Administered 2018-12-12 – 2018-12-14 (×3): 10 mg via ORAL
  Filled 2018-12-12 (×4): qty 1

## 2018-12-12 MED ORDER — LITHIUM CARBONATE 300 MG PO CAPS
300.0000 mg | ORAL_CAPSULE | Freq: Every day | ORAL | Status: DC
  Administered 2018-12-13 – 2018-12-14 (×2): 300 mg via ORAL
  Filled 2018-12-12 (×2): qty 1

## 2018-12-12 NOTE — ED Provider Notes (Signed)
[facility] EMERGENCY DEPARTMENT Provider Note   CSN: [id number] Arrival date & time: 12/12/18  9381     History   Chief Complaint Chief Complaint  Patient presents with  . Abdominal Pain  . Emesis  . Nausea    HPI Melanie Reynolds is a 68 y.o. female.     68 year old female presents with epigastric left upper quadrant abdominal pain x1 day.  Pain is been constant in nature and does not radiate to her back.  She has had nonbilious emesis without fever or chills.  No vaginal bleeding or discharge.  No prior history of same.  Patient states it began after she ate a meal.  Treatment use prior to arrival     Past Medical History:  Diagnosis Date  . Anxiety   . Arthritis    "knees, legs, back" (06/01/2013)  . Bipolar depression ([place])   . Cataract   . Chronic lower back pain   . Constipation, chronic   . Depression   . High cholesterol   . Irritable bowel syndrome (IBS)   . OSA (obstructive sleep apnea)   . Pneumonia 1970's; 1980's; 06/01/2013   "total of 3 times"  . Sleep apnea    cpap  . Type II diabetes mellitus [facility])     Patient Active Problem List   Diagnosis Date Noted  . Allergic rhinitis 01/16/2017  . Legionella pneumonia ([place]) 01/09/2016  . Sepsis ([place]) 01/04/2016  . AKI (acute kidney injury) ([place]) 01/04/2016  . Exertional dyspnea 08/29/2015  . Tobacco user 08/29/2015  . Obesity 08/29/2015  . OSA (obstructive sleep apnea) 08/13/2013  . CAP (community acquired pneumonia) 06/01/2013  . Chest pain, musculoskeletal 06/01/2013  . Bipolar 1 disorder ([place]) 06/01/2013  . Hypotension 06/01/2013  . Chronic pain 06/01/2013  . Diabetes ([place]) 06/01/2013    Past Surgical History:  Procedure Laterality Date  . ABDOMINAL HYSTERECTOMY  1982  . BACK SURGERY    . CHOLECYSTECTOMY  1990's  . FOREARM FRACTURE SURGERY Left 1999   "put in plates"  . IMPLANTATION VAGAL NERVE STIMULATOR  2013  . POSTERIOR LUMBAR FUSION  2000's  . SHOULDER OPEN ROTATOR CUFF  REPAIR Right 2000  . TUBAL LIGATION  1978  . VAGAL NERVE STIMULATOR REMOVAL  2013   "got an infection in there" (06/01/2013)     OB History   No obstetric history on file.      Home Medications    Prior to Admission medications   Medication Sig Start Date End Date Taking? Authorizing Provider  ACCU-CHEK AVIVA PLUS test strip daily. for testing 11/27/17   [provider]  ACCU-CHEK SOFTCLIX LANCETS lancets daily. for testing 12/01/17   [provider]  albuterol (PROAIR HFA) 108 (90 Base) MCG/ACT inhaler INHALE TWO PUFFS FOUR TIMES DAILY AS NEEDED 07/11/15   [provider]  bismuth-metronidazole-tetracycline (PYLERA) 017-510-258 MG capsule Take 3 capsules by mouth 4 (four) times daily -  before meals and at bedtime. 02/24/18   Mauri Pole, MD  cetirizine (ZYRTEC) 10 MG tablet Take 1 tablet by mouth at bedtime as needed. 09/11/17   [provider]  clobetasol ointment (TEMOVATE) 5.27 % Apply 1 application topically 2 (two) times daily. 10/02/17   [provider]  diclofenac sodium (VOLTAREN) 1 % GEL Apply 4 g topically 4 (four) times daily as needed. 10/02/17   [provider]  diphenhydrAMINE (BENADRYL) 25 mg capsule Take 1 capsule (25 mg total) by mouth every 8 (eight) hours as needed  for itching or allergies. Patient not taking: Reported on 06/18/2018 06/03/13   Regalado, Jerald Kief A, MD  fluticasone (FLONASE) 50 MCG/ACT nasal spray Place 2 sprays into both nostrils daily. Patient taking differently: Place 2 sprays into both nostrils as needed.  06/03/13   Regalado, Belkys A, MD  furosemide (LASIX) 20 MG tablet Take 1 tablet by mouth daily.    [provider]  lithium carbonate 150 MG capsule Take 150 mg by mouth daily.    [provider]  lithium carbonate 300 MG capsule Take 300 mg by mouth daily.    [provider]  lubiprostone (AMITIZA) 24 MCG capsule Take 1 capsule (24 mcg total) by mouth 2 (two) times daily  with a meal. 11/16/18   Nandigam, Venia Minks, MD  LYRICA 150 MG capsule Take 150 mg by mouth 3 (three) times daily. 10/02/17   [provider]  meclizine (ANTIVERT) 12.5 MG tablet Take 1 tablet by mouth 3 (three) times daily as needed.    [provider]  morphine (MS CONTIN) 30 MG 12 hr tablet Take 30 mg by mouth every 12 (twelve) hours. 10/10/17   [provider]  omeprazole (PRILOSEC) 40 MG capsule Take 1 tablet twice a day while on antibiotics. Patient not taking: Reported on 06/18/2018 02/24/18   Mauri Pole, MD  ondansetron (ZOFRAN-ODT) 4 MG disintegrating tablet Take 1 tablet by mouth as needed. 07/28/17   [provider]  oxyCODONE (ROXICODONE) 15 MG immediate release tablet Take 15 mg by mouth every 6 (six) hours as needed. for pain 10/10/17   [provider]  OXYCODONE-ACETAMINOPHEN PO Take by mouth.    [provider]  potassium chloride SA (K-DUR,KLOR-CON) 20 MEQ tablet Take 20 mEq by mouth daily.    [provider]  ranitidine (ZANTAC) 150 MG tablet Take 150 mg by mouth 2 (two) times daily. 10/20/17   [provider]  simvastatin (ZOCOR) 10 MG tablet Take 1 tablet by mouth daily. 05/29/13   [provider]  tiZANidine (ZANAFLEX) 4 MG tablet Take 4 mg by mouth 2 (two) times daily as needed. 10/20/17   [provider]  topiramate (TOPAMAX) 50 MG tablet Take 50 mg by mouth daily.     [provider]  VENTOLIN HFA 108 (90 Base) MCG/ACT inhaler inhale TWO puffs into THE lungs FOUR TIMES DAILY 11/27/17   [provider]  VICTOZA 18 MG/3ML SOPN Inject 18 mg into the skin daily.  07/29/14   [provider]    Family History Family History  Problem Relation Age of Onset  . Kidney failure Mother   . Stomach cancer Sister   . Colon cancer Neg Hx   . Esophageal cancer Neg Hx   . Rectal cancer Neg Hx     Social History Social History   Tobacco Use  . Smoking status: Current  Every Day Smoker    Packs/day: 0.50    Years: 45.00    Pack years: 22.50    Types: Cigarettes  . Smokeless tobacco: Never Used  . Tobacco comment: h/o 1 PPD  Substance Use Topics  . Alcohol use: Yes    Comment: occasional  . Drug use: Yes    Types: Marijuana     Allergies   Latex and Naproxen   Review of Systems Review of Systems  All other systems reviewed and are negative.    Physical Exam Updated Vital Signs BP (!) 128/94   Pulse 88   Temp 97.9 F (  36.6 C) (Oral)   Resp 17   Ht 1.549 m (5\' 1" )   Wt 99.3 kg   SpO2 100%   BMI 41.36 kg/m   Physical Exam Vitals signs and nursing note reviewed.  Constitutional:      General: She is not in acute distress.    Appearance: Normal appearance. She is well-developed. She is not toxic-appearing.  HENT:     Head: Normocephalic and atraumatic.  Eyes:     General: Lids are normal.     Conjunctiva/sclera: Conjunctivae normal.     Pupils: Pupils are equal, round, and reactive to light.  Neck:     Musculoskeletal: Normal range of motion and neck supple.     Thyroid: No thyroid mass.     Trachea: No tracheal deviation.  Cardiovascular:     Rate and Rhythm: Normal rate and regular rhythm.     Heart sounds: Normal heart sounds. No murmur. No gallop.   Pulmonary:     Effort: Pulmonary effort is normal. No respiratory distress.     Breath sounds: Normal breath sounds. No stridor. No decreased breath sounds, wheezing, rhonchi or rales.  Abdominal:     General: Bowel sounds are normal. There is no distension.     Palpations: Abdomen is soft.     Tenderness: There is abdominal tenderness in the epigastric area. There is no rebound.    Musculoskeletal: Normal range of motion.        General: No tenderness.  Skin:    General: Skin is warm and dry.     Findings: No abrasion or rash.  Neurological:     Mental Status: She is alert and oriented to person, place, and time.     GCS: GCS eye subscore is 4. GCS verbal subscore  is 5. GCS motor subscore is 6.     Cranial Nerves: No cranial nerve deficit.     Sensory: No sensory deficit.  Psychiatric:        Speech: Speech normal.        Behavior: Behavior normal.      ED Treatments / Results  Labs (all labs ordered are listed, but only abnormal results are displayed) Labs Reviewed  COMPREHENSIVE METABOLIC PANEL - Abnormal; Notable for the following components:      Result Value   Glucose, Bld 173 (*)    All other components within normal limits  CBC - Abnormal; Notable for the following components:   HCT 46.4 (*)    All other components within normal limits  LIPASE, BLOOD  URINALYSIS, ROUTINE W REFLEX MICROSCOPIC    EKG None  Radiology No results found.  Procedures Procedures (including critical care time)  Medications Ordered in ED Medications  sodium chloride flush (NS) 0.9 % injection 3 mL (has no administration in time range)  ondansetron (ZOFRAN) injection 4 mg (has no administration in time range)  morphine 4 MG/ML injection 6 mg (has no administration in time range)     Initial Impression / Assessment and Plan / ED Course  I have reviewed the triage vital signs and the nursing notes.  Pertinent labs & imaging results that were available during my care of the patient were reviewed by me and considered in my medical decision making (see chart for details).        Treated for pain here and feels better.  CT scan shows splenic infarction.  Will be admitted to the medicine service  Final Clinical Impressions(s) / ED Diagnoses   Final diagnoses:  None    ED Discharge Orders    None       Lacretia Leigh, MD 12/12/18 1411

## 2018-12-12 NOTE — H&P (Signed)
[facility]                                                                                    Patient Demographics  Melanie Reynolds, is a 68 y.o. female  CSN: [id number]  MRN: [id number]  DOB - [birth year]  Admit Date - 12/12/2018  Outpatient Primary MD for the patient is Nolene Ebbs, MD   With History of -  Past Medical History:  Diagnosis Date  . Anxiety   . Arthritis    "knees, legs, back" (06/01/2013)  . Bipolar depression ([place])   . Cataract   . Chronic lower back pain   . Constipation, chronic   . Depression   . High cholesterol   . Irritable bowel syndrome (IBS)   . OSA (obstructive sleep apnea)   . Pneumonia 1970's; 1980's; 06/01/2013   "total of 3 times"  . Sleep apnea    cpap  . Type II diabetes mellitus ([place])       Past Surgical History:  Procedure Laterality Date  . ABDOMINAL HYSTERECTOMY  1982  . BACK SURGERY    . CHOLECYSTECTOMY  1990's  . FOREARM FRACTURE SURGERY Left 1999   "put in plates"  . IMPLANTATION VAGAL NERVE STIMULATOR  2013  . POSTERIOR LUMBAR FUSION  2000's  . SHOULDER OPEN ROTATOR CUFF REPAIR Right 2000  . TUBAL LIGATION  1978  . VAGAL NERVE STIMULATOR REMOVAL  2013   "got an infection in there" (06/01/2013)    in for   Chief Complaint  Patient presents with  . Abdominal Pain  . Emesis  . Nausea     HPI  Melanie Reynolds  is a 68 y.o. female, with past medical history significant for back pain, hyperlipidemia and diabetes mellitus type 2 presenting with 1 day history of left upper quadrant pain, nonradiating, associated with nausea vomiting.  No chest pains or shortness of breath.  No cough, no fever chills or headaches. Work-up in the emergency room showed splenic infarct.  Analysis was negative and blood work was nonrevealing. Patient will be admitted for hydration and pain control.  She might need work-up for splenic infarct    Review of Systems    In addition to the above patient denies any chest pains cough  with productive sputum or fever. No history of bloody stools or blood in the urine no history of focal weakness No dizziness or loss of consciousness No urinary symptoms Other systems were reviewed and were negative   Social History Social History   Tobacco Use  . Smoking status: Current Every Day Smoker    Packs/day: 0.50    Years: 45.00    Pack years: 22.50    Types: Cigarettes  . Smokeless tobacco: Never Used  . Tobacco comment: h/o 1 PPD  Substance Use Topics  . Alcohol use: Yes    Comment: occasional     Family History Family History  Problem Relation Age of Onset  . Kidney failure Mother   . Stomach cancer Sister   . Colon cancer Neg Hx   . Esophageal cancer Neg Hx   . Rectal cancer Neg Hx      Prior  to Admission medications   Medication Sig Start Date End Date Taking? Authorizing Provider  ACCU-CHEK AVIVA PLUS test strip daily. for testing 11/27/17   [provider]  ACCU-CHEK SOFTCLIX LANCETS lancets daily. for testing 12/01/17   [provider]  albuterol (PROAIR HFA) 108 (90 Base) MCG/ACT inhaler INHALE TWO PUFFS FOUR TIMES DAILY AS NEEDED 07/11/15   [provider]  bismuth-metronidazole-tetracycline (PYLERA) 284-132-440 MG capsule Take 3 capsules by mouth 4 (four) times daily -  before meals and at bedtime. 02/24/18   Mauri Pole, MD  cetirizine (ZYRTEC) 10 MG tablet Take 1 tablet by mouth at bedtime as needed. 09/11/17   [provider]  clobetasol ointment (TEMOVATE) 1.02 % Apply 1 application topically 2 (two) times daily. 10/02/17   [provider]  diclofenac sodium (VOLTAREN) 1 % GEL Apply 4 g topically 4 (four) times daily as needed. 10/02/17   [provider]  diphenhydrAMINE (BENADRYL) 25 mg capsule Take 1 capsule (25 mg total) by mouth every 8 (eight) hours as needed for itching or allergies. Patient not taking: Reported on 06/18/2018 06/03/13   Regalado, Jerald Kief A, MD  fluticasone (FLONASE) 50 MCG/ACT  nasal spray Place 2 sprays into both nostrils daily. Patient taking differently: Place 2 sprays into both nostrils as needed.  06/03/13   Regalado, Belkys A, MD  furosemide (LASIX) 20 MG tablet Take 1 tablet by mouth daily.    [provider]  lithium carbonate 150 MG capsule Take 150 mg by mouth daily.    [provider]  lithium carbonate 300 MG capsule Take 300 mg by mouth daily.    [provider]  lubiprostone (AMITIZA) 24 MCG capsule Take 1 capsule (24 mcg total) by mouth 2 (two) times daily with a meal. 11/16/18   Nandigam, Venia Minks, MD  LYRICA 150 MG capsule Take 150 mg by mouth 3 (three) times daily. 10/02/17   [provider]  meclizine (ANTIVERT) 12.5 MG tablet Take 1 tablet by mouth 3 (three) times daily as needed.    [provider]  morphine (MS CONTIN) 30 MG 12 hr tablet Take 30 mg by mouth every 12 (twelve) hours. 10/10/17   [provider]  omeprazole (PRILOSEC) 40 MG capsule Take 1 tablet twice a day while on antibiotics. Patient not taking: Reported on 06/18/2018 02/24/18   Mauri Pole, MD  ondansetron (ZOFRAN-ODT) 4 MG disintegrating tablet Take 1 tablet by mouth as needed. 07/28/17   [provider]  oxyCODONE (ROXICODONE) 15 MG immediate release tablet Take 15 mg by mouth every 6 (six) hours as needed. for pain 10/10/17   [provider]  OXYCODONE-ACETAMINOPHEN PO Take by mouth.    [provider]  potassium chloride SA (K-DUR,KLOR-CON) 20 MEQ tablet Take 20 mEq by mouth daily.    [provider]  ranitidine (ZANTAC) 150 MG tablet Take 150 mg by mouth 2 (two) times daily. 10/20/17   [provider]  simvastatin (ZOCOR) 10 MG tablet Take 1 tablet by mouth daily. 05/29/13   [provider]  tiZANidine (ZANAFLEX) 4 MG tablet Take 4 mg by mouth 2 (two) times daily as needed. 10/20/17   [provider]  topiramate (TOPAMAX) 50 MG tablet Take 50 mg by mouth daily.      [provider]  VENTOLIN HFA 108 (90 Base) MCG/ACT inhaler inhale TWO puffs into THE lungs FOUR TIMES DAILY 11/27/17   [provider]  VICTOZA 18 MG/3ML SOPN Inject 18 mg into  the skin daily.  07/29/14   [provider]    Allergies  Allergen Reactions  . Morphine And Related Swelling and Rash  . Latex Itching  . Naproxen Other (See Comments)    Abdominal pain    Physical Exam  Vitals  Blood pressure 110/70, pulse 88, temperature 97.9 F (36.6 C), temperature source Oral, resp. rate 17, height 5\' 1"  (1.549 m), weight 99.3 kg, SpO2 100 %.   General appearance: Very pleasant young female in pain HEENT no jaundice or pallor, no facial deviation Neck supple, no neck vein distention Chest clear and resonant Heart normal S1-S2 no murmurs gallops rubs Abdomen soft mild left upper quadrant tenderness Extremities no clubbing cyanosis or edema Neuro grossly nonfocal Skin no rashes or ulcers   Data Review  CBC Recent Labs  Lab 12/12/18 0923  WBC 10.4  HGB 14.2  HCT 46.4*  PLT 264  MCV 94.3  MCH 28.9  MCHC 30.6  RDW 15.1   ------------------------------------------------------------------------------------------------------------------  Chemistries  Recent Labs  Lab 12/12/18 0923  NA 141  K 4.3  CL 108  CO2 24  GLUCOSE 173*  BUN 14  CREATININE 0.93  CALCIUM 9.4  AST 22  ALT 22  ALKPHOS 74  BILITOT 0.8   ------------------------------------------------------------------------------------------------------------------ estimated creatinine clearance is 64.3 mL/min (by C-G formula based on SCr of 0.93 mg/dL). ------------------------------------------------------------------------------------------------------------------ No results for input(s): TSH, T4TOTAL, T3FREE, THYROIDAB in the last 72 hours.  Invalid input(s): FREET3   Coagulation profile No results for input(s): INR, PROTIME in the last 168  hours. ------------------------------------------------------------------------------------------------------------------- No results for input(s): DDIMER in the last 72 hours. -------------------------------------------------------------------------------------------------------------------  Cardiac Enzymes No results for input(s): CKMB, TROPONINI, MYOGLOBIN in the last 168 hours.  Invalid input(s): CK ------------------------------------------------------------------------------------------------------------------ Invalid input(s): POCBNP   ---------------------------------------------------------------------------------------------------------------  Urinalysis    Component Value Date/Time   COLORURINE YELLOW 12/12/2018 0914   APPEARANCEUR CLEAR 12/12/2018 0914   LABSPEC 1.018 12/12/2018 0914   PHURINE 8.0 12/12/2018 0914   GLUCOSEU NEGATIVE 12/12/2018 0914   HGBUR NEGATIVE 12/12/2018 0914   BILIRUBINUR NEGATIVE 12/12/2018 0914   KETONESUR NEGATIVE 12/12/2018 0914   PROTEINUR NEGATIVE 12/12/2018 0914   UROBILINOGEN 1.0 10/13/2016 1500   NITRITE NEGATIVE 12/12/2018 0914   LEUKOCYTESUR NEGATIVE 12/12/2018 0914    ----------------------------------------------------------------------------------------------------------------  Imaging results:   Ct Abdomen Pelvis W Contrast  Result Date: 12/12/2018 CLINICAL DATA:  Left abdominal pain, nausea and vomiting for the past 2 days. EXAM: CT ABDOMEN AND PELVIS WITH CONTRAST TECHNIQUE: Multidetector CT imaging of the abdomen and pelvis was performed using the standard protocol following bolus administration of intravenous contrast. CONTRAST:  174mL OMNIPAQUE IOHEXOL 300 MG/ML  SOLN COMPARISON:  08/27/2017. FINDINGS: Lower chest: Minimal left basilar atelectasis or scarring. Normal sized heart. Hepatobiliary: Diffuse low density of the liver relative to the spleen. Cholecystectomy clips. Mildly progressive intrahepatic and extrahepatic  biliary ductal dilatation, borderline dilated for a post cholecystectomy patient. No obstructing stone or mass seen. Pancreas: Unremarkable. No pancreatic ductal dilatation or surrounding inflammatory changes. Spleen: Interval wedge-shaped area of low density in the spleen posteriorly. Normal in size and shape. Adrenals/Urinary Tract: Normal appearing adrenal glands. Stable right renal cyst and tiny peripheral left renal cysts/cortical scars. Unremarkable urinary bladder and ureters. Stomach/Bowel: Unremarkable stomach, small bowel and colon. No evidence of appendicitis. Vascular/Lymphatic: Atheromatous arterial calcifications without aneurysm. No enlarged lymph nodes. Reproductive: Status post hysterectomy. No adnexal masses. Other: Midline surgical scar. Stable minimal patchy density in the superficial subcutaneous fat at the level of the pelvis anteriorly,  bilaterally. Musculoskeletal: Extensive lumbar and lower thoracic spine degenerative changes. These include facet degenerative changes with associated mild anterolisthesis at the L4-5 level with mild progression. Stable interbody and left posterior hardware fusion at the L5-S1 level. Stable 25% L1 superior endplate compression deformity and Schmorl's node formation with no acute fracture lines. IMPRESSION: 1. Interval wedge-shaped area of low density in the posterior aspect of the spleen, compatible with a splenic infarct. 2. Mildly progressive intrahepatic and extrahepatic biliary ductal dilatation, borderline dilated for a post cholecystectomy patient. 3. Diffuse hepatic steatosis. Electronically Signed   By: Claudie Revering M.D.   On: 12/12/2018 13:18      Assessment & Plan  Splenic infarction Unknown cause We will admit for hydration and pain control  Bipolar disorder Continue with lithium  Diabetes mellitus type 2 ISS  Hyperlipidemia Continue Zocor  Chronic back pain Continue with Zanaflex and as needed medications for pain  DVT  Prophylaxis Lovenox  AM Labs Ordered, also please review Full Orders  Family Communication: Discussed with daughter, Dewitt Hoes.  Code Status full  Disposition Plan: Home  Time spent in minutes : 42 minutes  Condition GUARDED   @SIGNATURE @

## 2018-12-12 NOTE — ED Notes (Signed)
. ED TO INPATIENT HANDOFF REPORT  ED Nurse Name and Phone #: Harriette Bouillon [phone]  S Name/Age/Gender Melanie Reynolds 68 y.o. female Room/Bed: 045C/045C  Code Status   Code Status: Prior  Home/SNF/Other Home Patient oriented to: self, place, time and situation Is this baseline? Yes   Triage Complete: Triage complete  Chief Complaint abd pain  Triage Note Pt. Stated, I started having a stomach ache and throwing up all night. Nothing would stay down.   Allergies Allergies  Allergen Reactions  . Morphine And Related Swelling and Rash  . Latex Itching  . Naproxen Other (See Comments)    Abdominal pain    Level of Care/Admitting Diagnosis ED Disposition    ED Disposition Condition Comment   Admit  Hospital Area: [place] [[id number]]  Level of Care: Med-Surg [16]  I expect the patient will be discharged within 24 hours: No (not a candidate for 5C-Observation unit)  Covid Evaluation: Asymptomatic Screening Protocol (No Symptoms)  Diagnosis: Splenic infarction [616073]  Admitting Physician: Laren Everts, [place]  Attending Physician: Laren Everts, ALI Marshal.[place]  PT Class (Do Not Modify): Observation [104]  PT Acc Code (Do Not Modify): Observation [10022]       B Medical/Surgery History Past Medical History:  Diagnosis Date  . Anxiety   . Arthritis    "knees, legs, back" (06/01/2013)  . Bipolar depression ([place])   . Cataract   . Chronic lower back pain   . Constipation, chronic   . Depression   . High cholesterol   . Irritable bowel syndrome (IBS)   . OSA (obstructive sleep apnea)   . Pneumonia 1970's; 1980's; 06/01/2013   "total of 3 times"  . Sleep apnea    cpap  . Type II diabetes mellitus ([place])    Past Surgical History:  Procedure Laterality Date  . ABDOMINAL HYSTERECTOMY  1982  . BACK SURGERY    . CHOLECYSTECTOMY  1990's  . FOREARM FRACTURE SURGERY Left 1999   "put in plates"  . IMPLANTATION VAGAL NERVE STIMULATOR  2013  . POSTERIOR LUMBAR FUSION   2000's  . SHOULDER OPEN ROTATOR CUFF REPAIR Right 2000  . TUBAL LIGATION  1978  . VAGAL NERVE STIMULATOR REMOVAL  2013   "got an infection in there" (06/01/2013)     A IV Location/Drains/Wounds Patient Lines/Drains/Airways Status   Active Line/Drains/Airways    Name:   Placement date:   Placement time:   Site:   Days:   Peripheral IV 12/12/18 Right Antecubital   12/12/18    1229    Antecubital   less than 1          Intake/Output Last 24 hours No intake or output data in the 24 hours ending 12/12/18 1542  Labs/Imaging Results for orders placed or performed during the hospital encounter of 12/12/18 (from the past 48 hour(s))  Urinalysis, Routine w reflex microscopic     Status: None   Collection Time: 12/12/18  9:14 AM  Result Value Ref Range   Color, Urine YELLOW YELLOW   APPearance CLEAR CLEAR   Specific Gravity, Urine 1.018 1.005 - 1.030   pH 8.0 5.0 - 8.0   Glucose, UA NEGATIVE NEGATIVE mg/dL   Hgb urine dipstick NEGATIVE NEGATIVE   Bilirubin Urine NEGATIVE NEGATIVE   Ketones, ur NEGATIVE NEGATIVE mg/dL   Protein, ur NEGATIVE NEGATIVE mg/dL   Nitrite NEGATIVE NEGATIVE   Leukocytes,Ua NEGATIVE NEGATIVE    Comment: Performed at [facility], [address],  [place] [address]  Lipase, blood     Status: None   Collection Time: 12/12/18  9:23 AM  Result Value Ref Range   Lipase 36 11 - 51 U/L    Comment: Performed at [facility], [place] [address], [place], [place] [address]  Comprehensive metabolic panel     Status: Abnormal   Collection Time: 12/12/18  9:23 AM  Result Value Ref Range   Sodium 141 135 - 145 mmol/L   Potassium 4.3 3.5 - 5.1 mmol/L   Chloride 108 98 - 111 mmol/L   CO2 24 22 - 32 mmol/L   Glucose, Bld 173 (H) 70 - 99 mg/dL   BUN 14 8 - 23 mg/dL   Creatinine, Ser 0.93 0.44 - 1.00 mg/dL   Calcium 9.4 8.9 - 10.3 mg/dL   Total Protein 7.1 6.5 - 8.1 g/dL   Albumin 3.8 3.5 - 5.0 g/dL   AST 22 15 - 41 U/L   ALT 22 0 - 44 U/L   Alkaline  Phosphatase 74 38 - 126 U/L   Total Bilirubin 0.8 0.3 - 1.2 mg/dL   GFR calc non Af Amer >60 >60 mL/min   GFR calc Af Amer >60 >60 mL/min   Anion gap 9 5 - 15    Comment: Performed at [facility] [address] [place], [place] [address]  CBC     Status: Abnormal   Collection Time: 12/12/18  9:23 AM  Result Value Ref Range   WBC 10.4 4.0 - 10.5 K/uL   RBC 4.92 3.87 - 5.11 MIL/uL   Hemoglobin 14.2 12.0 - 15.0 g/dL   HCT 46.4 (H) 36.0 - 46.0 %   MCV 94.3 80.0 - 100.0 fL   MCH 28.9 26.0 - 34.0 pg   MCHC 30.6 30.0 - 36.0 g/dL   RDW 15.1 11.5 - 15.5 %   Platelets 264 150 - 400 K/uL   nRBC 0.0 0.0 - 0.2 %    Comment: Performed at [facility], [place] [address] [place], [place] [address]   Ct Abdomen Pelvis W Contrast  Result Date: 12/12/2018 CLINICAL DATA:  Left abdominal pain, nausea and vomiting for the past 2 days. EXAM: CT ABDOMEN AND PELVIS WITH CONTRAST TECHNIQUE: Multidetector CT imaging of the abdomen and pelvis was performed using the standard protocol following bolus administration of intravenous contrast. CONTRAST:  121mL OMNIPAQUE IOHEXOL 300 MG/ML  SOLN COMPARISON:  08/27/2017. FINDINGS: Lower chest: Minimal left basilar atelectasis or scarring. Normal sized heart. Hepatobiliary: Diffuse low density of the liver relative to the spleen. Cholecystectomy clips. Mildly progressive intrahepatic and extrahepatic biliary ductal dilatation, borderline dilated for a post cholecystectomy patient. No obstructing stone or mass seen. Pancreas: Unremarkable. No pancreatic ductal dilatation or surrounding inflammatory changes. Spleen: Interval wedge-shaped area of low density in the spleen posteriorly. Normal in size and shape. Adrenals/Urinary Tract: Normal appearing adrenal glands. Stable right renal cyst and tiny peripheral left renal cysts/cortical scars. Unremarkable urinary bladder and ureters. Stomach/Bowel: Unremarkable stomach, small bowel and colon. No evidence of appendicitis.  Vascular/Lymphatic: Atheromatous arterial calcifications without aneurysm. No enlarged lymph nodes. Reproductive: Status post hysterectomy. No adnexal masses. Other: Midline surgical scar. Stable minimal patchy density in the superficial subcutaneous fat at the level of the pelvis anteriorly, bilaterally. Musculoskeletal: Extensive lumbar and lower thoracic spine degenerative changes. These include facet degenerative changes with associated mild anterolisthesis at the L4-5 level with mild progression. Stable interbody and left posterior hardware fusion at the L5-S1 level. Stable  25% L1 superior endplate compression deformity and Schmorl's node formation with no acute fracture lines. IMPRESSION: 1. Interval wedge-shaped area of low density in the posterior aspect of the spleen, compatible with a splenic infarct. 2. Mildly progressive intrahepatic and extrahepatic biliary ductal dilatation, borderline dilated for a post cholecystectomy patient. 3. Diffuse hepatic steatosis. Electronically Signed   By: Claudie Revering M.D.   On: 12/12/2018 13:18    Pending Labs Unresulted Labs (From admission, onward)    Start     Ordered   12/12/18 1411  SARS CORONAVIRUS 2 Nasal Swab Aptima Multi Swab  (Asymptomatic/Tier 2 Patients Labs)  Once,   STAT    Question Answer Comment  Is this test for diagnosis or screening Screening   Symptomatic for COVID-19 as defined by CDC No   Hospitalized for COVID-19 No   Admitted to ICU for COVID-19 No   Previously tested for COVID-19 No   Resident in a congregate (group) care setting No   Employed in healthcare setting No   Pregnant No      12/12/18 1411   Signed and Held  HIV antibody (Routine Testing)  Once,   R     Signed and Held   Signed and Held  Hemoglobin A1c  Once,   R    Comments: To assess prior glycemic control    Signed and Held          Vitals/Pain Today's Vitals   12/12/18 1200 12/12/18 1215 12/12/18 1230 12/12/18 1313  BP: (!) 131/96 (!) 178/100 110/70    Pulse:      Resp:      Temp:      TempSrc:      SpO2:      Weight:      Height:      PainSc:    4     Isolation Precautions No active isolations  Medications Medications  sodium chloride flush (NS) 0.9 % injection 3 mL (has no administration in time range)  fentaNYL (SUBLIMAZE) injection 50 mcg (has no administration in time range)  ondansetron (ZOFRAN) injection 4 mg (4 mg Intravenous Given 12/12/18 1226)  fentaNYL (SUBLIMAZE) injection 50 mcg (50 mcg Intravenous Given 12/12/18 1225)  iohexol (OMNIPAQUE) 300 MG/ML solution 100 mL (100 mLs Intravenous Contrast Given 12/12/18 1257)    Mobility walks Low fall risk   Focused Assessments Cardiac Assessment Handoff:    Lab Results  Component Value Date   TROPONINI 0.07 ([place]) 01/05/2016   No results found for: DDIMER Does the Patient currently have chest pain? No     R Recommendations: See Admitting Provider Note  Report given to:   Additional Notes:

## 2018-12-12 NOTE — ED Triage Notes (Signed)
Pt. Stated, I started having a stomach ache and throwing up all night. Nothing would stay down.

## 2018-12-12 NOTE — Plan of Care (Signed)

## 2018-12-13 ENCOUNTER — Observation Stay (HOSPITAL_BASED_OUTPATIENT_CLINIC_OR_DEPARTMENT_OTHER): Payer: Medicare HMO

## 2018-12-13 DIAGNOSIS — I361 Nonrheumatic tricuspid (valve) insufficiency: Secondary | ICD-10-CM

## 2018-12-13 DIAGNOSIS — D735 Infarction of spleen: Secondary | ICD-10-CM | POA: Diagnosis not present

## 2018-12-13 LAB — GLUCOSE, CAPILLARY
Glucose-Capillary: 115 mg/dL — ABNORMAL HIGH (ref 70–99)
Glucose-Capillary: 126 mg/dL — ABNORMAL HIGH (ref 70–99)
Glucose-Capillary: 112 mg/dL — ABNORMAL HIGH (ref 70–99)
Glucose-Capillary: 102 mg/dL — ABNORMAL HIGH (ref 70–99)

## 2018-12-13 LAB — HIV ANTIBODY (ROUTINE TESTING W REFLEX): HIV Screen 4th Generation wRfx: NONREACTIVE

## 2018-12-13 LAB — ECHOCARDIOGRAM COMPLETE
Height: 63 in
Weight: 3583.8 oz

## 2018-12-13 NOTE — Plan of Care (Signed)
  Problem: Education: Goal: Knowledge of General Education information will improve Description Including pain rating scale, medication(s)/side effects and non-pharmacologic comfort measures Outcome: Progressing   

## 2018-12-13 NOTE — TOC Initial Note (Signed)
Transition of Care [facility]) - Initial/Assessment Note    Patient Details  Name: Melanie Reynolds MRN: [id number] Date of Birth: [birth year]  [facility] CM/SW Contact:    Bartholomew Crews, RN Phone Number: [phone] 12/13/2018, 3:51 PM  Clinical Narrative:                 Spoke with patient at the bedside. PTA home alone. Has cane, walker, 3N1 from when she had back surgery previously. No HH needs. Gets medications delivered to home. Has transportation when ready to transition home.   Expected Discharge Plan: Home/Self Care Barriers to Discharge: Continued Medical Work up   Patient Goals and CMS Choice Patient states their goals for this hospitalization and ongoing recovery are:: home CMS Medicare.gov Compare Post Acute Care list provided to:: Patient Choice offered to / list presented to : NA  Expected Discharge Plan and Services Expected Discharge Plan: Home/Self Care   Discharge Planning Services: CM Consult Post Acute Care Choice: NA Living arrangements for the past 2 months: Apartment                 DME Arranged: N/A DME Agency: NA       HH Arranged: NA [place] Agency: NA        Prior Living Arrangements/Services Living arrangements for the past 2 months: Apartment Lives with:: Self Patient language and need for interpreter reviewed:: Yes Do you feel safe going back to the place where you live?: Yes          Current home services: DME Criminal Activity/Legal Involvement Pertinent to Current Situation/Hospitalization: No - Comment as needed  Activities of Daily Living Home Assistive Devices/Equipment: Gilford Rile (specify type) ADL Screening (condition at time of admission) Patient's cognitive ability adequate to safely complete daily activities?: Yes Is the patient deaf or have difficulty hearing?: No Does the patient have difficulty seeing, even when wearing glasses/contacts?: No Does the patient have difficulty concentrating, remembering, or making  decisions?: No Patient able to express need for assistance with ADLs?: Yes Does the patient have difficulty dressing or bathing?: Yes Independently performs ADLs?: Yes (appropriate for developmental age) Does the patient have difficulty walking or climbing stairs?: No Weakness of Legs: None Weakness of Arms/Hands: None  Permission Sought/Granted                  Emotional Assessment Appearance:: Appears stated age Attitude/Demeanor/Rapport: Engaged Affect (typically observed): Accepting Orientation: : Oriented to Self, Oriented to Place, Oriented to  Time, Oriented to Situation Alcohol / Substance Use: Not Applicable Psych Involvement: No (comment)  Admission diagnosis:  Splenic infarction [D73.5] Patient Active Problem List   Diagnosis Date Noted  . 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  . Allergic rhinitis 01/16/2017  . Legionella pneumonia ([place]) 01/09/2016  . Sepsis ([place]) 01/04/2016  . AKI (acute kidney injury) ([place]) 01/04/2016  . Exertional dyspnea 08/29/2015  . Tobacco user 08/29/2015  . Obesity 08/29/2015  . OSA (obstructive sleep apnea) 08/13/2013  . CAP (community acquired pneumonia) 06/01/2013  . Chest pain, musculoskeletal 06/01/2013  . Bipolar 1 disorder ([place]) 06/01/2013  . Hypotension 06/01/2013  . Chronic pain 06/01/2013  . Diabetes ([place]) 06/01/2013   PCP:  Nolene Ebbs, MD Pharmacy:   [place], [place] [address] Phone: [phone] Fax: [phone]     Social Determinants of Health (SDOH) Interventions    Readmission Risk Interventions No flowsheet data found.

## 2018-12-13 NOTE — Progress Notes (Signed)
*  PRELIMINARY RESULTS* Echocardiogram 2D Echocardiogram has been performed.  Melanie Reynolds 12/13/2018, 3:32 PM

## 2018-12-13 NOTE — Care Management Obs Status (Signed)
[place] NOTIFICATION   Patient Details  Name: Isaly Fasching MRN: [id number] Date of Birth: 12/22/1950   Medicare Observation Status Notification Given:  Yes    Bartholomew Crews, RN 12/13/2018, 3:53 PM

## 2018-12-13 NOTE — Progress Notes (Addendum)
Progress Note    Melanie Reynolds  NAT:[id number] DOB: Apr 12, 1951  DOA: 12/12/2018 PCP: Nolene Ebbs, MD    Brief Narrative:     Medical records reviewed and are as summarized below:  Melanie Reynolds is an 68 y.o. female with past medical history significant for back pain, hyperlipidemia and diabetes mellitus type 2 presenting with 1 day history of left upper quadrant pain, nonradiating, associated with nausea vomiting.   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   Family Communication/Anticipated D/C date and plan/Code Status   DVT prophylaxis: Lovenox ordered. Code Status: Full Code.  Family Communication:  Disposition Plan: home in aM?   Medical Consultants:    None.     Subjective:   Has been having feelings of heart racing and palpitations in last week  Objective:    Vitals:   12/12/18 1649 12/12/18 2011 12/12/18 2111 12/13/18 0524  BP: 106/67  96/76 108/66  Pulse: 73  76 66  Resp: 18  18 18   Temp: 98 F (36.7 C)  98.4 F (36.9 C) 98.4 F (36.9 C)  TempSrc: Oral  Oral Oral  SpO2: 95% 97% 99% 100%  Weight: 101.6 kg     Height: 5\' 3"  (1.6 m)       Intake/Output Summary (Last 24 hours) at 12/13/2018 1234 Last data filed at 12/13/2018 1016 Gross per 24 hour  Intake 801.67 ml  Output 700 ml  Net 101.67 ml   Filed Weights   12/12/18 1144 12/12/18 1649  Weight: 99.3 kg 101.6 kg    Exam:  In bed, obese female, NAD rrr No increased work of breathing  Data Reviewed:   I have personally reviewed following labs and imaging studies:  Labs: Labs show the following:   Basic Metabolic Panel: Recent Labs  Lab 12/12/18 0923  NA 141  K 4.3  CL 108  CO2 24  GLUCOSE 173*  BUN 14  CREATININE 0.93  CALCIUM 9.4   GFR Estimated Creatinine Clearance: 67.7 mL/min (by C-G formula based on SCr of 0.93 mg/dL). Liver Function Tests: Recent Labs  Lab 12/12/18 0923  AST 22  ALT 22  ALKPHOS 74  BILITOT 0.8  PROT 7.1  ALBUMIN 3.8   Recent Labs  Lab 12/12/18 0923  LIPASE 36   No results for input(s): AMMONIA in the last 168 hours. Coagulation profile No results for input(s): INR, PROTIME in the last 168 hours.  CBC: Recent Labs  Lab 12/12/18 0923  WBC 10.4  HGB 14.2  HCT 46.4*  MCV 94.3  PLT 264   Cardiac Enzymes: No results for input(s): CKTOTAL, CKMB, CKMBINDEX, TROPONINI in the last 168 hours. BNP (last 3 results) No results for input(s): PROBNP in the last 8760 hours. CBG: Recent Labs  Lab 12/12/18 1714 12/12/18 1716 12/12/18 2113 12/13/18 0810 12/13/18 1147  GLUCAP 57* 83 126* 115* 126*   D-Dimer: No results for input(s): DDIMER in the last 72 hours. Hgb A1c: Recent Labs  12/12/18 1729  HGBA1C 6.2*   Lipid Profile: No results for input(s): CHOL, HDL, LDLCALC, TRIG, CHOLHDL, LDLDIRECT in the last 72 hours. Thyroid function studies: No results for input(s): TSH, T4TOTAL, T3FREE, THYROIDAB in the last 72 hours.  Invalid input(s): FREET3 Anemia work up: No results for input(s): VITAMINB12, FOLATE, FERRITIN, TIBC, IRON, RETICCTPCT in the last 72 hours. Sepsis Labs: Recent Labs  Lab 12/12/18 0923  WBC 10.4    Microbiology Recent Results (from the past 240 hour(s))  SARS CORONAVIRUS 2 Nasal Swab Aptima Multi Swab     Status: None   Collection Time: 12/12/18  3:14 PM   Specimen: Aptima Multi Swab; Nasal Swab  Result Value Ref Range  Status   SARS Coronavirus 2 NEGATIVE NEGATIVE Final    Comment: (NOTE) SARS-CoV-2 target nucleic acids are NOT DETECTED. The SARS-CoV-2 RNA is generally detectable in upper and lower respiratory specimens during the acute phase of infection. Negative results do not preclude SARS-CoV-2 infection, do not rule out co-infections with other pathogens, and should not be used as the sole basis for treatment or other patient management decisions. Negative results must be combined with clinical observations, patient history, and epidemiological information. The expected result is Negative. Fact Sheet for Patients: SugarRoll.be Fact Sheet for Healthcare Providers: https://www.[place]-mathews.com/ This test is not yet approved or cleared by the [place] FDA and  has been authorized for detection and/or diagnosis of SARS-CoV-2 by FDA under an Emergency Use Authorization (EUA). This EUA will remain  in effect (meaning this test can be used) for the duration of the COVID-19 declaration under Section 56 4(b)(1) of the Act, 21 U.S.C. section 360bbb-3(b)(1), unless the authorization is terminated or revoked sooner. Performed at [facility], [place] [address] [place],  [address]     Procedures and diagnostic studies:  Ct Abdomen Pelvis W Contrast  Result Date: 12/12/2018 CLINICAL DATA:  Left abdominal pain, nausea and vomiting for the past 2 days. EXAM: CT ABDOMEN AND PELVIS WITH CONTRAST TECHNIQUE: Multidetector CT imaging of the abdomen and pelvis was performed using the standard protocol following bolus administration of intravenous contrast. CONTRAST:  131mL OMNIPAQUE IOHEXOL 300 MG/ML  SOLN COMPARISON:  08/27/2017. FINDINGS: Lower chest: Minimal left basilar atelectasis or scarring. Normal sized heart. Hepatobiliary: Diffuse low density of the liver relative to the spleen. Cholecystectomy clips. Mildly progressive intrahepatic and  extrahepatic biliary ductal dilatation, borderline dilated for a post cholecystectomy patient. No obstructing stone or mass seen. Pancreas: Unremarkable. No pancreatic ductal dilatation or surrounding inflammatory changes. Spleen: Interval wedge-shaped area of low density in the spleen posteriorly. Normal in size and shape. Adrenals/Urinary Tract: Normal appearing adrenal glands. Stable right renal cyst and tiny peripheral left renal cysts/cortical scars. Unremarkable urinary bladder and ureters. Stomach/Bowel: Unremarkable stomach, small bowel and colon. No evidence of appendicitis. Vascular/Lymphatic: Atheromatous arterial calcifications without aneurysm. No enlarged lymph nodes. Reproductive: Status post hysterectomy. No adnexal masses. Other: Midline surgical scar. Stable minimal patchy density in the superficial subcutaneous fat at the level of the pelvis anteriorly, bilaterally. Musculoskeletal: Extensive lumbar and lower thoracic spine degenerative changes. These include facet degenerative changes with associated mild anterolisthesis at the L4-5 level with mild progression. Stable interbody and left posterior hardware fusion at the L5-S1 level. Stable 25% L1 superior endplate compression deformity and Schmorl's node formation with no acute fracture lines. IMPRESSION: 1. Interval wedge-shaped area of low density in the posterior aspect of the spleen, compatible with a splenic infarct. 2. Mildly progressive intrahepatic and extrahepatic biliary ductal  dilatation, borderline dilated for a post cholecystectomy patient. 3. Diffuse hepatic steatosis. Electronically Signed   By: Claudie Revering M.D.   On: 12/12/2018 13:18    Medications:   . enoxaparin (LOVENOX) injection  40 mg Subcutaneous Q24H  . famotidine  10 mg Oral BID  . fluticasone  2 spray Each Nare Daily  . furosemide  20 mg Oral Daily  . insulin aspart  0-5 Units Subcutaneous QHS  . insulin aspart  0-9 Units Subcutaneous TID WC  . lithium  carbonate  150 mg Oral Daily  . lithium carbonate  300 mg Oral Daily  . loratadine  10 mg Oral Daily  . pantoprazole  40 mg Oral Daily  . pregabalin  150 mg Oral TID  . simvastatin  10 mg Oral Daily  . sodium chloride flush  3 mL Intravenous Once   Continuous Infusions:   LOS: 0 days   Geradine Girt  [facility]   How to contact the [facility] Attending or Consulting provider [place] or covering provider during after hours [place], for this patient?  1. Check the care team in [facility] and look for a) attending/consulting [facility] provider listed and b) the [facility] team listed 2. Log into [email or web address] and use [employer]'s universal password to access. If you do not have the password, please contact the hospital operator. 3. Locate the [facility] provider you are looking for under [facility] and page to a number that you can be directly reached. 4. If you still have difficulty reaching the provider, please page the [facility] (Director on Call) for the Hospitalists listed on amion for assistance.  12/13/2018, 12:34 PM

## 2018-12-14 ENCOUNTER — Other Ambulatory Visit: Payer: Self-pay | Admitting: Cardiology

## 2018-12-14 DIAGNOSIS — D735 Infarction of spleen: Secondary | ICD-10-CM

## 2018-12-14 LAB — GLUCOSE, CAPILLARY: Glucose-Capillary: 166 mg/dL — ABNORMAL HIGH (ref 70–99)

## 2018-12-14 NOTE — Discharge Summary (Signed)
Physician Discharge Summary  Melanie Reynolds BSJ:[id number] DOB: [birth year] DOA: 12/12/2018  PCP: Nolene Ebbs, MD  Admit date: 12/12/2018 Discharge date: 12/14/2018  Admitted From: home Discharge disposition: home   Recommendations for Outpatient Follow-Up:   1. Referral for event monitor to r/o a fib causing splenic infarct   Discharge Diagnosis:   Active Problems:   Splenic infarction    Discharge Condition: Improved.  Diet recommendation: Low sodium, heart healthy.  Carbohydrate-modified  Wound care: None.  Code status: Full.   History of Present Illness:   Per Dr. Laren Everts: Melanie Reynolds  is a 69 y.o. female, with past medical history significant for back pain, hyperlipidemia and diabetes mellitus type 2 presenting with 1 day history of left upper quadrant pain, nonradiating, associated with nausea vomiting.  No chest pains or shortness of breath.  No cough, no fever chills or headaches. Work-up in the emergency room showed splenic infarct.  Analysis was negative and blood work was nonrevealing. Patient will be admitted for hydration and pain control.  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 The cavity size was normal. Diastolic dysfunction, grade indeterminate. Indeterminate filling pressures No evidence of left ventricular regional wall motion abnormalities. -blood cultures (h/o infected vagus nerve stimulator but these cultures NGTD and patient had blood cultures in 2017 NGTD so doubt infectious) -denies COVID exposure/infection -denies family h/o clots -denies sickle cell trait - 30 day event monitor -eating well, no pain  Bipolar disorder Continue with lithium  Diabetes mellitus type 2 -continue home  meds  Hyperlipidemia Continue Zocor  obesity Body mass index is 39.68 kg/m.    Medical Consultants:      Discharge Exam:   Vitals:   12/13/18 2106 12/14/18 0523  BP: 129/87 132/84  Pulse: 85 88  Resp: 18 18  Temp: 98.7 F (37.1 C) 98 F (36.7 C)  SpO2: 100% 99%   Vitals:   12/13/18 0524 12/13/18 1342 12/13/18 2106 12/14/18 0523  BP: 108/66 101/72 129/87 132/84  Pulse: 66 68 85 88  Resp: 18 18 18 18   Temp: 98.4 F (36.9 C) 98 F (36.7 C) 98.7 F (37.1 C) 98 F (36.7 C)  TempSrc: Oral Axillary Oral Oral  SpO2: 100% 95% 100% 99%  Weight:      Height:        General exam: Appears calm and comfortable.   The results of significant diagnostics from this hospitalization (including imaging, microbiology, ancillary and laboratory) are listed below for reference.     Procedures and Diagnostic Studies:   Ct Abdomen Pelvis W Contrast  Result Date: 12/12/2018 CLINICAL DATA:  Left abdominal pain, nausea and vomiting for the past 2 days. EXAM: CT ABDOMEN AND PELVIS WITH CONTRAST TECHNIQUE: Multidetector CT imaging of the abdomen and pelvis was performed using the standard protocol following bolus administration of intravenous contrast. CONTRAST:  169mL OMNIPAQUE IOHEXOL 300 MG/ML  SOLN COMPARISON:  08/27/2017. FINDINGS: Lower chest: Minimal left basilar atelectasis or scarring. Normal sized heart. Hepatobiliary: Diffuse low density of the liver relative to the spleen. Cholecystectomy clips. Mildly progressive intrahepatic and extrahepatic biliary ductal dilatation, borderline dilated for a post cholecystectomy patient. No obstructing stone or mass seen. Pancreas: Unremarkable. No pancreatic ductal dilatation or surrounding inflammatory changes. Spleen: Interval wedge-shaped area  of low density in the spleen posteriorly. Normal in size and shape. Adrenals/Urinary Tract: Normal appearing adrenal glands. Stable right renal cyst and tiny peripheral left renal cysts/cortical scars.  Unremarkable urinary bladder and ureters. Stomach/Bowel: Unremarkable stomach, small bowel and colon. No evidence of appendicitis. Vascular/Lymphatic: Atheromatous arterial calcifications without aneurysm. No enlarged lymph nodes. Reproductive: Status post hysterectomy. No adnexal masses. Other: Midline surgical scar. Stable minimal patchy density in the superficial subcutaneous fat at the level of the pelvis anteriorly, bilaterally. Musculoskeletal: Extensive lumbar and lower thoracic spine degenerative changes. These include facet degenerative changes with associated mild anterolisthesis at the L4-5 level with mild progression. Stable interbody and left posterior hardware fusion at the L5-S1 level. Stable 25% L1 superior endplate compression deformity and Schmorl's node formation with no acute fracture lines. IMPRESSION: 1. Interval wedge-shaped area of low density in the posterior aspect of the spleen, compatible with a splenic infarct. 2. Mildly progressive intrahepatic and extrahepatic biliary ductal dilatation, borderline dilated for a post cholecystectomy patient. 3. Diffuse hepatic steatosis. Electronically Signed   By: Claudie Revering M.D.   On: 12/12/2018 13:18     Labs:   Basic Metabolic Panel: Recent Labs  Lab 12/12/18 0923  NA 141  K 4.3  CL 108  CO2 24  GLUCOSE 173*  BUN 14  CREATININE 0.93  CALCIUM 9.4   GFR Estimated Creatinine Clearance: 67.7 mL/min (by C-G formula based on SCr of 0.93 mg/dL). Liver Function Tests: Recent Labs  Lab 12/12/18 0923  AST 22  ALT 22  ALKPHOS 74  BILITOT 0.8  PROT 7.1  ALBUMIN 3.8   Recent Labs  Lab 12/12/18 0923  LIPASE 36   No results for input(s): AMMONIA in the last 168 hours. Coagulation profile No results for input(s): INR, PROTIME in the last 168 hours.  CBC: Recent Labs  Lab 12/12/18 0923  WBC 10.4  HGB 14.2  HCT 46.4*  MCV 94.3  PLT 264   Cardiac Enzymes: No results for input(s): CKTOTAL, CKMB, CKMBINDEX, TROPONINI  in the last 168 hours. BNP: Invalid input(s): POCBNP CBG: Recent Labs  Lab 12/13/18 0810 12/13/18 1147 12/13/18 1728 12/13/18 2104 12/14/18 0840  GLUCAP 115* 126* 112* 102* 166*   D-Dimer No results for input(s): DDIMER in the last 72 hours. Hgb A1c Recent Labs    12/12/18 1729  HGBA1C 6.2*   Lipid Profile No results for input(s): CHOL, HDL, LDLCALC, TRIG, CHOLHDL, LDLDIRECT in the last 72 hours. Thyroid function studies No results for input(s): TSH, T4TOTAL, T3FREE, THYROIDAB in the last 72 hours.  Invalid input(s): FREET3 Anemia work up No results for input(s): VITAMINB12, FOLATE, FERRITIN, TIBC, IRON, RETICCTPCT in the last 72 hours. Microbiology Recent Results (from the past 240 hour(s))  SARS CORONAVIRUS 2 Nasal Swab Aptima Multi Swab     Status: None   Collection Time: 12/12/18  3:14 PM   Specimen: Aptima Multi Swab; Nasal Swab  Result Value Ref Range Status   SARS Coronavirus 2 NEGATIVE NEGATIVE Final    Comment: (NOTE) SARS-CoV-2 target nucleic acids are NOT DETECTED. The SARS-CoV-2 RNA is generally detectable in upper and lower respiratory specimens during the acute phase of infection. Negative results do not preclude SARS-CoV-2 infection, do not rule out co-infections with other pathogens, and should not be used as the sole basis for treatment or other patient management decisions. Negative results must be combined with clinical observations, patient history, and epidemiological information. The expected result is Negative. Fact Sheet for Patients: SugarRoll.be Fact Sheet for Healthcare  Providers: https://www.[place]-mathews.com/ This test is not yet approved or cleared by the [place] and  has been authorized for detection and/or diagnosis of SARS-CoV-2 by FDA under an Emergency Use Authorization (EUA). This EUA will remain  in effect (meaning this test can be used) for the duration of the COVID-19  declaration under Section 56 4(b)(1) of the Act, 21 U.S.C. section 360bbb-3(b)(1), unless the authorization is terminated or revoked sooner. Performed at [facility], [place] [address] [place], [place] [address]   Culture, blood (Routine X 2) w Reflex to ID Panel     Status: None (Preliminary result)   Collection Time: 12/13/18 12:11 PM   Specimen: BLOOD LEFT HAND  Result Value Ref Range Status   Specimen Description BLOOD LEFT HAND  Final   Special Requests   Final    BOTTLES DRAWN AEROBIC ONLY Blood Culture adequate volume   Culture   Final    NO GROWTH < 24 HOURS Performed at [facility], [place] [address] [place], [place] [address]    Report Status PENDING  Incomplete     Discharge Instructions:   Discharge Instructions    Diet - low sodium heart healthy   Complete by: As directed    Diet Carb Modified   Complete by: As directed    Discharge instructions   Complete by: As directed    The cardiology office will be in touch regarding your 30 day event monitor Resume lithium at your home dose   Increase activity slowly   Complete by: As directed      Allergies as of 12/14/2018      Reactions   Morphine And Related Swelling, Rash   Latex Itching   Naproxen Other (See Comments)   Abdominal pain      Medication List    STOP taking these medications   diphenhydrAMINE 25 mg capsule Commonly known as: BENADRYL   omeprazole 40 MG capsule Commonly known as: PRILOSEC     TAKE these medications   Accu-Chek Aviva Plus test strip Generic drug: glucose blood daily. for testing   Accu-Chek Softclix Lancets lancets daily. for testing   clobetasol ointment 0.05 % Commonly known as: TEMOVATE Apply 1 application topically 2 (two) times daily.   diclofenac sodium 1 % Gel Commonly known as: VOLTAREN Apply 4 g topically 4 (four) times daily as needed (for pain).   fluticasone 50 MCG/ACT nasal spray Commonly known as: FLONASE Place 2 sprays into both  nostrils daily. What changed:   when to take this  reasons to take this   furosemide 20 MG tablet Commonly known as: LASIX Take 1 tablet by mouth daily.   lithium carbonate 300 MG capsule Take 300 mg by mouth daily.   lubiprostone 24 MCG capsule Commonly known as: Amitiza Take 1 capsule (24 mcg total) by mouth 2 (two) times daily with a meal.   Lyrica 150 MG capsule Generic drug: pregabalin Take 150 mg by mouth 3 (three) times daily.   meclizine 12.5 MG tablet Commonly known as: ANTIVERT Take 1 tablet by mouth 3 (three) times daily as needed.   ondansetron 4 MG disintegrating tablet Commonly known as: ZOFRAN-ODT Take 4 mg by mouth as needed for nausea.   oxyCODONE 15 MG immediate release tablet Commonly known as: ROXICODONE Take 15 mg by mouth every 6 (six) hours as needed for pain. for pain   potassium chloride SA 20 MEQ tablet Commonly known as: K-DUR Take 20 mEq by mouth daily.  ProAir HFA 108 (90 Base) MCG/ACT inhaler Generic drug: albuterol Inhale 2 puffs into the lungs every 6 (six) hours as needed for wheezing.   simvastatin 10 MG tablet Commonly known as: ZOCOR Take 50 mg by mouth daily.   T-RELIEF CBD+13 SL Place 1 vial under the tongue 2 (two) times daily as needed (for pain).   tiZANidine 4 MG tablet Commonly known as: ZANAFLEX Take 4 mg by mouth 2 (two) times daily as needed for muscle spasms.   topiramate 50 MG tablet Commonly known as: TOPAMAX Take 50 mg by mouth daily.   Victoza 18 MG/3ML Sopn Generic drug: liraglutide Inject 0.6 mg into the skin daily.      Follow-up Information    Nolene Ebbs, MD Follow up in 1 week(s).   Specialty: Internal Medicine Contact information: [place] [address] [phone]            Time coordinating discharge: 25 min  Signed:  Geradine Girt DO  [facility] 12/14/2018, 9:13 AM

## 2018-12-15 ENCOUNTER — Telehealth: Payer: Self-pay | Admitting: *Deleted

## 2018-12-15 NOTE — Telephone Encounter (Signed)
[employer] to ship a 30 day cardiac event monitor to her home.  Instructions reviewed briefly as they are included in the monitor kit.

## 2018-12-16 ENCOUNTER — Ambulatory Visit (INDEPENDENT_AMBULATORY_CARE_PROVIDER_SITE_OTHER): Payer: Medicare HMO

## 2018-12-16 DIAGNOSIS — D735 Infarction of spleen: Secondary | ICD-10-CM

## 2018-12-16 DIAGNOSIS — I4891 Unspecified atrial fibrillation: Secondary | ICD-10-CM

## 2018-12-16 LAB — ANTIPHOSPHOLIPID SYNDROME EVAL, BLD
Anticardiolipin IgA: <9 APL U/mL (ref 0–11)
Anticardiolipin IgG: <9 GPL U/mL (ref 0–14)
Anticardiolipin IgM: <9 MPL U/mL (ref 0–12)
DRVVT: 36.2 s (ref 0.0–47.0)
PTT Lupus Anticoagulant: 36.5 s (ref 0.0–51.9)
Phosphatydalserine, IgA: 3 APS IgA (ref 0–20)
Phosphatydalserine, IgG: 8 GPS IgG (ref 0–11)
Phosphatydalserine, IgM: 6 MPS IgM (ref 0–25)

## 2018-12-18 LAB — CULTURE, BLOOD (ROUTINE X 2)
Culture: NO GROWTH
Special Requests: ADEQUATE

## 2019-01-13 ENCOUNTER — Encounter: Payer: Self-pay | Admitting: Gastroenterology

## 2019-01-25 ENCOUNTER — Encounter: Payer: Self-pay | Admitting: Gastroenterology

## 2019-01-25 ENCOUNTER — Ambulatory Visit (INDEPENDENT_AMBULATORY_CARE_PROVIDER_SITE_OTHER): Payer: Medicare HMO | Admitting: Gastroenterology

## 2019-01-25 ENCOUNTER — Other Ambulatory Visit (INDEPENDENT_AMBULATORY_CARE_PROVIDER_SITE_OTHER): Payer: Medicare HMO

## 2019-01-25 ENCOUNTER — Other Ambulatory Visit: Payer: Self-pay

## 2019-01-25 VITALS — BP 94/68 | HR 80 | Temp 98.4°F | Ht 61.5 in | Wt 223.0 lb

## 2019-01-25 DIAGNOSIS — F17200 Nicotine dependence, unspecified, uncomplicated: Secondary | ICD-10-CM | POA: Diagnosis not present

## 2019-01-25 DIAGNOSIS — D735 Infarction of spleen: Secondary | ICD-10-CM

## 2019-01-25 DIAGNOSIS — R1012 Left upper quadrant pain: Secondary | ICD-10-CM | POA: Diagnosis not present

## 2019-01-25 LAB — BASIC METABOLIC PANEL
BUN: 14 mg/dL (ref 6–23)
CO2: 28 mEq/L (ref 19–32)
Calcium: 9.9 mg/dL (ref 8.4–10.5)
Chloride: 108 mEq/L (ref 96–112)
Creatinine, Ser: 0.97 mg/dL (ref 0.40–1.20)
GFR: 69.33 mL/min (ref >60.00)
Glucose, Bld: 83 mg/dL (ref 70–99)
Potassium: 3.7 mEq/L (ref 3.5–5.1)
Sodium: 143 mEq/L (ref 135–145)

## 2019-01-25 NOTE — Progress Notes (Signed)
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 She developed acute left upper quadrant abdominal pain, continues to have persistent abdominal discomfort, worse with activity or postprandial.  She is a current smoker, is trying to quit. Denies any injury or trauma.  Relevant comorbid conditions include hyperlipidemia, type 2 diabetes, hypertension and morbid obesity.  COVID 19 negative Blood cultures negative No family history of sickle cell trait or hypercoagulable state Normal LV EF 60 to 123456, diastolic dysfunction, no wall motion abnormality. No A. fib on telemetry monitoring during hospitalization. She was referred to cardiology for 30-day event monitor  Colonoscopy February 10, 2018: 13 adenomatous polyps removed EGD February 10, 2018: H. pylori gastritis status post therapy   Outpatient Encounter Medications as of 01/25/2019  Medication Sig  . ACCU-CHEK AVIVA PLUS test strip daily. for testing  . ACCU-CHEK SOFTCLIX LANCETS lancets daily. for testing  . albuterol (PROAIR HFA) 108 (90 Base) MCG/ACT inhaler Inhale 2 puffs into the lungs every 6 (six) hours as needed for wheezing.   . clobetasol ointment (TEMOVATE) AB-123456789 % Apply 1 application topically as needed.   . diclofenac sodium (VOLTAREN) 1 % GEL Apply 4 g topically 4 (four) times daily as needed (for pain).   . fluticasone (FLONASE) 50 MCG/ACT nasal spray Place 2 sprays into both nostrils daily. (Patient taking differently: Place 2 sprays into both nostrils as needed for allergies. )  . furosemide (LASIX) 20 MG tablet Take 1 tablet by mouth daily.  Marland Kitchen lithium carbonate 300 MG capsule Take 300 mg by mouth daily.  Marland Kitchen lubiprostone (AMITIZA) 24  MCG capsule Take 1 capsule (24 mcg total) by mouth 2 (two) times daily with a meal.  . LYRICA 150 MG capsule Take 150 mg by mouth 3 (three) times daily.  . meclizine (ANTIVERT) 12.5 MG tablet Take 1 tablet by mouth 3 (three) times daily as needed.  . Misc Natural Products (T-RELIEF CBD+13 SL) Place 1 vial under the tongue 2 (two) times daily as needed (for pain).  . ondansetron (ZOFRAN-ODT) 4 MG disintegrating tablet Take 4 mg by mouth as needed for nausea.   Marland Kitchen oxyCODONE (ROXICODONE) 15 MG immediate release tablet Take 7.5 mg by mouth every 6 (six) hours as needed for pain. for pain  . potassium chloride SA (K-DUR,KLOR-CON) 20 MEQ tablet Take 20 mEq by mouth daily.  . simvastatin (ZOCOR) 10 MG tablet Take 50 mg by mouth daily.   Marland Kitchen tiZANidine (ZANAFLEX) 4 MG tablet Take 4 mg by mouth 2 (two) times daily as needed for muscle spasms.   Marland Kitchen topiramate (TOPAMAX) 50 MG tablet Take 50 mg by mouth daily.   Marland Kitchen VICTOZA 18 MG/3ML SOPN Inject 0.6 mg into the skin daily.   Marland Kitchen lithium 300 MG tablet Take 1 tablet orally at bedtime   No facility-administered encounter medications on file as of 01/25/2019.     Allergies as of 01/25/2019 - Review Complete 01/25/2019  Allergen Reaction Noted  . Morphine and related Swelling and Rash 12/12/2018  . Latex Itching 03/29/2012  . Naproxen Other (See Comments) 08/29/2015    Past Medical History:  Diagnosis Date  . Anxiety   . Arthritis    "knees, legs, back" (06/01/2013)  .  Bipolar depression ([place])   . Cataract   . Chronic lower back pain   . Constipation, chronic   . Depression   . High cholesterol   . Irritable bowel syndrome (IBS)   . OSA (obstructive sleep apnea)   . Pneumonia 1970's; 1980's; 06/01/2013   "total of 3 times"  . Sleep apnea    cpap  . Type II diabetes mellitus ([place])     Past Surgical History:  Procedure Laterality Date  . ABDOMINAL HYSTERECTOMY  1982  . BACK SURGERY    . CHOLECYSTECTOMY  1990's  . FOREARM FRACTURE SURGERY Left 1999    "put in plates"  . IMPLANTATION VAGAL NERVE STIMULATOR  2013  . POSTERIOR LUMBAR FUSION  2000's  . SHOULDER OPEN ROTATOR CUFF REPAIR Right 2000  . TUBAL LIGATION  1978  . VAGAL NERVE STIMULATOR REMOVAL  2013   "got an infection in there" (06/01/2013)    Family History  Problem Relation Age of Onset  . Kidney failure Mother   . Stomach cancer Sister   . Colon cancer Neg Hx   . Esophageal cancer Neg Hx   . Rectal cancer Neg Hx     Social History   Socioeconomic History  . Marital status: Divorced    Spouse name: Not on file  . Number of children: 3  . Years of education: Not on file  . Highest education level: Not on file  Occupational History  . Occupation: disabled  Social Needs  . Financial resource strain: Not on file  . Food insecurity    Worry: Not on file    Inability: Not on file  . Transportation needs    Medical: No    Non-medical: No  Tobacco Use  . Smoking status: Current Every Day Smoker    Packs/day: 0.50    Years: 45.00    Pack years: 22.50    Types: Cigarettes  . Smokeless tobacco: Never Used  . Tobacco comment: h/o 1 PPD  Substance and Sexual Activity  . Alcohol use: Yes    Comment: occasional  . Drug use: Yes    Types: Marijuana  . Sexual activity: Not Currently  Lifestyle  . Physical activity    Days per week: Not on file    Minutes per session: Not on file  . Stress: Not on file  Relationships  . Social Herbalist on phone: Not on file    Gets together: Not on file    Attends religious service: Not on file    Active member of club or organization: Not on file    Attends meetings of clubs or organizations: Not on file    Relationship status: Not on file  . Intimate partner violence    Fear of current or ex partner: No    Emotionally abused: No    Physically abused: No    Forced sexual activity: No  Other Topics Concern  . Not on file  Social History Narrative  . Not on file      Review of systems: Review of Systems   Constitutional: Negative for fever and chills.  HENT: Negative.   Eyes: Negative for blurred vision.  Respiratory: Negative for cough, shortness of breath and wheezing.   Cardiovascular: Negative for chest pain and palpitations.  Gastrointestinal: as per HPI Genitourinary: Negative for dysuria, urgency, frequency and hematuria.  Musculoskeletal: Negative for myalgias, back pain and joint pain.  Skin: Negative for itching and rash.  Neurological: Negative for dizziness,  tremors, focal weakness, seizures and loss of consciousness.  Endo/Heme/Allergies: Positive for seasonal allergies.  Psychiatric/Behavioral: Negative for depression, suicidal ideas and hallucinations.  All other systems reviewed and are negative.   Physical Exam: Vitals:   01/25/19 1406  BP: 94/68  Pulse: 80  Temp: 98.4 F (36.9 C)   Body mass index is 41.45 kg/m. Gen:      No acute distress HEENT:  EOMI, sclera anicteric Neck:     No masses; no thyromegaly Lungs:    Clear to auscultation bilaterally; normal respiratory effort CV:         Regular rate and rhythm; no murmurs Abd:      + bowel sounds; soft, left upper quadrant tender; no palpable masses, no distension Ext:    No edema; adequate peripheral perfusion Skin:      Warm and dry; no rash Neuro: alert and oriented x 3 Psych: normal mood and affect  Data Reviewed:  Reviewed labs, radiology imaging, old records and pertinent past GI work up   Assessment and Plan/Recommendations:  68 year old female with morbid obesity, type 2 diabetes, hyperlipidemia and current smoker recently hospitalized with acute splenic infarct of unclear etiology Will obtain CT angios abdomen and pelvis to exclude stenotic lesion  Discussed smoking cessation in detail with patient Dietary modification and exercise to lose weight  Due for surveillance colonoscopy in October 2020, 13 adenomatous sessile polyps removed last year. Will hold off scheduling the colonoscopy until  after CT and resolution of splenic infarct  Small frequent meals  Return in 2 months or sooner if needed  25 minutes was spent face-to-face with the patient. Greater than 50% of the time used for counseling as well as treatment plan and follow-up. She had multiple questions which were answered to her satisfaction  K. Denzil Magnuson , MD    CC: Nolene Ebbs, MD

## 2019-01-25 NOTE — Patient Instructions (Addendum)
You have been scheduled for a CT scan of the abdomen and pelvis at [facility] ([address]---this is in the same building as Press [facility]).   You are scheduled on   02/08/2019  at 1pm. You should arrive 15 minutes prior to your appointment time for registration. Please follow the written instructions below on the day of your exam:  WARNING: IF YOU ARE ALLERGIC TO IODINE/X-RAY DYE, PLEASE NOTIFY RADIOLOGY IMMEDIATELY AT [phone]! YOU WILL BE GIVEN A 13 HOUR PREMEDICATION PREP.  1) Do not eat or drink anything after 9 am (4 hours prior to your test)   You may take any medications as prescribed with a small amount of water, if necessary. If you take any of the following medications: METFORMIN, GLUCOPHAGE, GLUCOVANCE, AVANDAMET, RIOMET, FORTAMET, Scissors MET, JANUMET, GLUMETZA or METAGLIP, you MAY be asked to HOLD this medication 48 hours AFTER the exam.  The purpose of you drinking the oral contrast is to aid in the visualization of your intestinal tract. The contrast solution may cause some diarrhea. Depending on your individual set of symptoms, you may also receive an intravenous injection of x-ray contrast/dye. Plan on being at [facility] for 30 minutes or longer, depending on the type of exam you are having performed.  This test typically takes 30-45 minutes to complete.  If you have any questions regarding your exam or if you need to reschedule, you may call the CT department at [phone] between the hours of 8:00 am and 5:00 pm, Monday-Friday.  Go to the basement for labs today  Yiu will need a colonoscopy scheduled for the end of November when the schedule comes out   I appreciate the  opportunity to care for you  Thank You   Harl Bowie , MD   ________________________________________________________________________

## 2019-01-27 ENCOUNTER — Encounter: Payer: Self-pay | Admitting: Gastroenterology

## 2019-01-27 LAB — HEPATITIS C ANTIBODY
Hepatitis C Ab: NONREACTIVE
SIGNAL TO CUT-OFF: 0.03 (ref <1.00)

## 2019-01-27 LAB — HEPATITIS B CORE ANTIBODY, TOTAL: Hep B Core Total Ab: NONREACTIVE

## 2019-01-27 LAB — EPSTEIN-BARR VIRUS NUCLEAR ANTIGEN ANTIBODY, IGG: EBV NA IgG: 30.9 U/mL — ABNORMAL HIGH

## 2019-01-27 LAB — HEPATITIS B E ANTIBODY: Hep B E Ab: NONREACTIVE

## 2019-01-27 LAB — HEPATITIS A ANTIBODY, TOTAL: Hepatitis A AB,Total: REACTIVE — AB

## 2019-01-27 LAB — HEPATITIS B SURFACE ANTIBODY,QUALITATIVE: Hep B S Ab: NONREACTIVE

## 2019-01-28 ENCOUNTER — Telehealth: Payer: Self-pay | Admitting: Gastroenterology

## 2019-01-28 DIAGNOSIS — D735 Infarction of spleen: Secondary | ICD-10-CM

## 2019-01-28 DIAGNOSIS — R161 Splenomegaly, not elsewhere classified: Secondary | ICD-10-CM

## 2019-01-28 NOTE — Telephone Encounter (Signed)
Please see result note 

## 2019-02-04 ENCOUNTER — Other Ambulatory Visit: Payer: Self-pay | Admitting: Cardiology

## 2019-02-04 ENCOUNTER — Telehealth: Payer: Self-pay

## 2019-02-04 DIAGNOSIS — I4891 Unspecified atrial fibrillation: Secondary | ICD-10-CM

## 2019-02-04 DIAGNOSIS — D735 Infarction of spleen: Secondary | ICD-10-CM

## 2019-02-04 NOTE — Telephone Encounter (Signed)
error 

## 2019-02-08 ENCOUNTER — Ambulatory Visit (INDEPENDENT_AMBULATORY_CARE_PROVIDER_SITE_OTHER)
Admission: RE | Admit: 2019-02-08 | Discharge: 2019-02-08 | Disposition: A | Discharge status: Home / Self Care | Payer: Medicare HMO | Source: Ambulatory Visit | Attending: Gastroenterology | Admitting: Gastroenterology

## 2019-02-08 ENCOUNTER — Other Ambulatory Visit: Payer: Self-pay

## 2019-02-08 DIAGNOSIS — D735 Infarction of spleen: Secondary | ICD-10-CM

## 2019-02-08 DIAGNOSIS — R1012 Left upper quadrant pain: Secondary | ICD-10-CM

## 2019-02-08 MED ORDER — IOHEXOL 350 MG/ML SOLN
100.0000 mL | Freq: Once | INTRAVENOUS | Status: AC | PRN
  Administered 2019-02-08: 14:00:00 100 mL via INTRAVENOUS

## 2019-03-09 ENCOUNTER — Other Ambulatory Visit: Payer: Self-pay | Admitting: *Deleted

## 2019-03-09 MED ORDER — LUBIPROSTONE 24 MCG PO CAPS: ORAL_CAPSULE | ORAL | 11 refills | Status: DC

## 2019-04-28 ENCOUNTER — Telehealth: Payer: Self-pay | Admitting: *Deleted

## 2019-04-28 NOTE — Telephone Encounter (Signed)
Yes she will not need pre visit with RN. Thanks

## 2019-04-28 NOTE — Telephone Encounter (Signed)
Do you just want me to cancel her PreOp appointment then?

## 2019-04-28 NOTE — Telephone Encounter (Signed)
Ok thanks 

## 2019-04-28 NOTE — Telephone Encounter (Signed)
To: Oda Kilts, CMA Subject: sch colon                                      Patient scheduled for a colonoscopy on 1/22 and Preop appointment on 1/7    Dr Silverio Decamp I scheduled this patient for a colon, you may want to review and make sure she is able to have it Thanks I think you was waiting to schedule it

## 2019-04-28 NOTE — Telephone Encounter (Signed)
You have no appointments available until 1/22, Ill keep a check on cancellations for her

## 2019-04-28 NOTE — Telephone Encounter (Signed)
Please bring her for office visit prior to procedure, next available appointment. Thanks

## 2019-05-05 NOTE — Telephone Encounter (Signed)
Scheduled pt to see Dr Treasa School on 1/13 at 10:30am and cancelled previsit  Patient is still scheduled for a colon on 1/22

## 2019-05-12 ENCOUNTER — Ambulatory Visit (INDEPENDENT_AMBULATORY_CARE_PROVIDER_SITE_OTHER): Payer: Medicare HMO | Admitting: Gastroenterology

## 2019-05-12 ENCOUNTER — Encounter: Payer: Self-pay | Admitting: Gastroenterology

## 2019-05-12 VITALS — BP 110/80 | HR 88 | Temp 98.3°F | Ht 61.5 in | Wt 224.2 lb

## 2019-05-12 DIAGNOSIS — F17210 Nicotine dependence, cigarettes, uncomplicated: Secondary | ICD-10-CM | POA: Diagnosis not present

## 2019-05-12 DIAGNOSIS — Z86010 Personal history of colonic polyps: Secondary | ICD-10-CM

## 2019-05-12 DIAGNOSIS — Z01818 Encounter for other preprocedural examination: Secondary | ICD-10-CM | POA: Diagnosis not present

## 2019-05-12 DIAGNOSIS — D735 Infarction of spleen: Secondary | ICD-10-CM | POA: Diagnosis not present

## 2019-05-12 MED ORDER — NA SULFATE-K SULFATE-MG SULF 17.5-3.13-1.6 GM/177ML PO SOLN: ORAL | 0 refills | Status: DC

## 2019-05-12 NOTE — Progress Notes (Signed)
Melanie Reynolds    [id number]    Nov 18, 1950  Primary Care Physician:Avbuere, Christean Grief, MD  Referring Physician: Nolene Ebbs, MD [address] [place],  [place] [address]   Chief complaint: Left upper quadrant abdominal pain  HPI:  69 year old female with morbid obesity, type 2 diabetes, hypertension, active smoker here for follow-up visit. Hospitalized in September 2020 with partial splenic infarct.  She continues to have mild left upper quadrant discomfort but significantly improved compared to prior. No change in bowel habits, melena or blood per rectum. She is due for surveillance colonoscopy. Colonoscopy February 10, 2018: 13 adenomatous polyps removed EGD February 10, 2018: H. pylori gastritis status post therapy  Outpatient Encounter Medications as of 05/12/2019  Medication Sig  . ACCU-CHEK AVIVA PLUS test strip daily. for testing  . ACCU-CHEK SOFTCLIX LANCETS lancets daily. for testing  . albuterol (PROAIR HFA) 108 (90 Base) MCG/ACT inhaler Inhale 2 puffs into the lungs every 6 (six) hours as needed for wheezing.   . clobetasol ointment (TEMOVATE) AB-123456789 % Apply 1 application topically as needed.   . diclofenac sodium (VOLTAREN) 1 % GEL Apply 4 g topically 4 (four) times daily as needed (for pain).   . fluticasone (FLONASE) 50 MCG/ACT nasal spray Place 2 sprays into both nostrils daily. (Patient taking differently: Place 2 sprays into both nostrils as needed for allergies. )  . furosemide (LASIX) 20 MG tablet Take 1 tablet by mouth daily.  Marland Kitchen lithium carbonate 300 MG capsule Take 300 mg by mouth daily.  Marland Kitchen lubiprostone (AMITIZA) 24 MCG capsule Take 1 capsule (24 mcg total) by mouth 2 (two) times daily with a meal.  . LYRICA 150 MG capsule Take 150 mg by mouth 3 (three) times daily.  . meclizine (ANTIVERT) 12.5 MG tablet Take 1 tablet by mouth 3 (three) times daily as needed.  . Misc Natural Products (T-RELIEF CBD+13 SL) Place 1 vial under the tongue 2 (two) times  daily as needed (for pain).  . ondansetron (ZOFRAN-ODT) 4 MG disintegrating tablet Take 4 mg by mouth as needed for nausea.   Marland Kitchen oxyCODONE-acetaminophen (PERCOCET) 7.5-325 MG tablet Take 1 tablet by mouth every 6 (six) hours as needed.  . potassium chloride SA (K-DUR,KLOR-CON) 20 MEQ tablet Take 20 mEq by mouth daily.  . simvastatin (ZOCOR) 10 MG tablet Take 10 mg by mouth daily.   . simvastatin (ZOCOR) 40 MG tablet Take 1 tablet by mouth at bedtime.  Marland Kitchen tiZANidine (ZANAFLEX) 4 MG tablet Take 4 mg by mouth 2 (two) times daily as needed for muscle spasms.   Marland Kitchen topiramate (TOPAMAX) 50 MG tablet Take 50 mg by mouth daily.   Marland Kitchen VICTOZA 18 MG/3ML SOPN Inject 0.6 mg into the skin daily.   . [DISCONTINUED] lithium 300 MG tablet Take 1 tablet orally at bedtime  . [DISCONTINUED] oxyCODONE (ROXICODONE) 15 MG immediate release tablet Take 7.5 mg by mouth every 6 (six) hours as needed for pain. for pain   No facility-administered encounter medications on file as of 05/12/2019.    Allergies as of 05/12/2019 - Review Complete 05/12/2019  Allergen Reaction Noted  . Morphine and related Swelling and Rash 12/12/2018  . Latex Itching 03/29/2012  . Naproxen Other (See Comments) 08/29/2015    Past Medical History:  Diagnosis Date  . Anxiety   . Arthritis    "knees, legs, back" (06/01/2013)  . Bipolar depression ([place])   . Cataract   . Chronic lower back pain   .  Constipation, chronic   . Depression   . High cholesterol   . Irritable bowel syndrome (IBS)   . OSA (obstructive sleep apnea)   . Pneumonia 1970's; 1980's; 06/01/2013   "total of 3 times"  . Sleep apnea    cpap  . Type II diabetes mellitus ([place])     Past Surgical History:  Procedure Laterality Date  . ABDOMINAL HYSTERECTOMY  1982  . BACK SURGERY    . CHOLECYSTECTOMY  1990's  . FOREARM FRACTURE SURGERY Left 1999   "put in plates"  . IMPLANTATION VAGAL NERVE STIMULATOR  2013  . POSTERIOR LUMBAR FUSION  2000's  . SHOULDER OPEN ROTATOR CUFF  REPAIR Right 2000  . TUBAL LIGATION  1978  . VAGAL NERVE STIMULATOR REMOVAL  2013   "got an infection in there" (06/01/2013)    Family History  Problem Relation Age of Onset  . Kidney failure Mother   . Stomach cancer Sister   . Colon cancer Neg Hx   . Esophageal cancer Neg Hx   . Rectal cancer Neg Hx     Social History   Socioeconomic History  . Marital status: Divorced    Spouse name: Not on file  . Number of children: 3  . Years of education: Not on file  . Highest education level: Not on file  Occupational History  . Occupation: disabled  Tobacco Use  . Smoking status: Current Every Day Smoker    Packs/day: 0.50    Years: 45.00    Pack years: 22.50    Types: Cigarettes  . Smokeless tobacco: Never Used  . Tobacco comment: h/o 1 PPD  Substance and Sexual Activity  . Alcohol use: Yes    Comment: occasional  . Drug use: Yes    Types: Marijuana  . Sexual activity: Not Currently  Other Topics Concern  . Not on file  Social History Narrative  . Not on file   Social Determinants of Health   Financial Resource Strain:   . Difficulty of Paying Living Expenses: Not on file  Food Insecurity:   . Worried About Charity fundraiser in the Last Year: Not on file  . Ran Out of Food in the Last Year: Not on file  Transportation Needs: No Transportation Needs  . Lack of Transportation (Medical): No  . Lack of Transportation (Non-Medical): No  Physical Activity:   . Days of Exercise per Week: Not on file  . Minutes of Exercise per Session: Not on file  Stress:   . Feeling of Stress : Not on file  Social Connections:   . Frequency of Communication with Friends and Family: Not on file  . Frequency of Social Gatherings with Friends and Family: Not on file  . Attends Religious Services: Not on file  . Active Member of Clubs or Organizations: Not on file  . Attends Archivist Meetings: Not on file  . Marital Status: Not on file  Intimate Partner Violence: Not At  Risk  . Fear of Current or Ex-Partner: No  . Emotionally Abused: No  . Physically Abused: No  . Sexually Abused: No      Review of systems: Review of Systems  Review of Systems  Constitutional: Negative.   HENT: Negative.   Eyes: Negative.   Respiratory: Negative.   Cardiovascular: Negative.   Genitourinary: Negative.   Musculoskeletal: Negative.   Skin: Negative.   Neurological: Negative.   Endo/Heme/Allergies: Negative.   Psychiatric/Behavioral: Negative.      Physical Exam:  Vitals:   05/12/19 1051  BP: 110/80  Pulse: 88  Temp: 98.3 F (36.8 C)   Body mass index is 41.69 kg/m. Gen:      No acute distress HEENT:  EOMI, sclera anicteric Neck:     No masses; no thyromegaly Lungs:    Clear to auscultation bilaterally; normal respiratory effort CV:         Regular rate and rhythm; no murmurs Abd:      + bowel sounds; soft, non-tender; no palpable masses, no distension Ext:    No edema; adequate peripheral perfusion Skin:      Warm and dry; no rash Neuro: alert and oriented x 3 Psych: normal mood and affect  Data Reviewed:  Reviewed labs, radiology imaging, old records and pertinent past GI work up   Assessment and Plan/Recommendations:  69 year old female with morbid obesity, type 2 diabetes, hyperlipidemia, hypertension, current smoker s/p recent hospitalization in September 2020 with partial splenic infarct Follow-up CT angio abdomen October 2020 showed small peripheral splenic infarct and normal patent vasculature with no evidence of aneurysm, dissection, vasculitis or fibromuscular dysplasia.  Discussed smoking cessation Diet and exercise to lose weight  Due for surveillance colonoscopy, had 13 adenomatous polyps removed in October 2019. We will proceed with colonoscopy The risks and benefits as well as alternatives of endoscopic procedure(s) have been discussed and reviewed. All questions answered. The patient agrees to proceed. Discussed with patient  potential risk for splenic injury or trauma during colonoscopy, will try to avoid looping or pressure during procedure  The risks and benefits as well as alternatives of endoscopic procedure(s) have been discussed and reviewed. All questions answered. The patient agrees to proceed.  This visit required 32 minutes of patient care (this includes precharting, chart review, review of results, face-to-face time used for counseling as well as treatment plan and follow-up. The patient was provided an opportunity to ask questions and all were answered. The patient agreed with the plan and demonstrated an understanding of the instructions.  Damaris Hippo , MD    CC: Nolene Ebbs, MD  5

## 2019-05-12 NOTE — Patient Instructions (Addendum)
If you are age 68 or older, your body mass index should be between 23-30. Your Body mass index is 41.69 kg/m. If this is out of the aforementioned range listed, please consider follow up with your Primary Care Provider.  If you are age 26 or younger, your body mass index should be between 19-25. Your Body mass index is 41.69 kg/m. If this is out of the aformentioned range listed, please consider follow up with your Primary Care Provider.     You have been scheduled for a colonoscopy. Please follow written instructions given to you at your visit today.  Please pick up your prep supplies at the pharmacy within the next 1-3 days. If you use inhalers (even only as needed), please bring them with you on the day of your procedure.  I appreciate the  opportunity to care for you  Thank You   Harl Bowie , MD

## 2019-05-19 ENCOUNTER — Ambulatory Visit (INDEPENDENT_AMBULATORY_CARE_PROVIDER_SITE_OTHER): Payer: Medicare HMO

## 2019-05-19 ENCOUNTER — Other Ambulatory Visit: Payer: Self-pay | Admitting: Gastroenterology

## 2019-05-19 DIAGNOSIS — Z1159 Encounter for screening for other viral diseases: Secondary | ICD-10-CM

## 2019-05-19 LAB — SARS CORONAVIRUS 2 (TAT 6-24 HRS): SARS Coronavirus 2: NEGATIVE

## 2019-05-21 ENCOUNTER — Encounter: Payer: Self-pay | Admitting: Gastroenterology

## 2019-05-21 ENCOUNTER — Other Ambulatory Visit: Payer: Self-pay

## 2019-05-21 ENCOUNTER — Ambulatory Visit (AMBULATORY_SURGERY_CENTER): Payer: Medicare HMO | Admitting: Gastroenterology

## 2019-05-21 VITALS — BP 115/75 | HR 81 | Temp 98.7°F | Resp 17 | Ht 61.5 in | Wt 224.0 lb

## 2019-05-21 DIAGNOSIS — Z86010 Personal history of colonic polyps: Secondary | ICD-10-CM | POA: Diagnosis not present

## 2019-05-21 DIAGNOSIS — D123 Benign neoplasm of transverse colon: Secondary | ICD-10-CM | POA: Diagnosis not present

## 2019-05-21 MED ORDER — SODIUM CHLORIDE 0.9 % IV SOLN: 500.0000 mL | Freq: Once | INTRAVENOUS | Status: DC

## 2019-05-21 NOTE — Progress Notes (Signed)
[place]

## 2019-05-21 NOTE — Progress Notes (Signed)
PT taken to PACU. Monitors in place. VSS. Report given to RN. 

## 2019-05-21 NOTE — Patient Instructions (Signed)
Thank you for letting us take care of your healthcare needs today. Please see handouts given to you on Polyps, Diverticulosis and Hemorrhoids.      YOU HAD AN ENDOSCOPIC PROCEDURE TODAY AT [facility]:   Refer to the procedure report that was given to you for any specific questions about what was found during the examination.  If the procedure report does not answer your questions, please call your gastroenterologist to clarify.  If you requested that your care partner not be given the details of your procedure findings, then the procedure report has been included in a sealed envelope for you to review at your convenience later.  YOU SHOULD EXPECT: Some feelings of bloating in the abdomen. Passage of more gas than usual.  Walking can help get rid of the air that was put into your GI tract during the procedure and reduce the bloating. If you had a lower endoscopy (such as a colonoscopy or flexible sigmoidoscopy) you may notice spotting of blood in your stool or on the toilet paper. If you underwent a bowel prep for your procedure, you may not have a normal bowel movement for a few days.  Please Note:  You might notice some irritation and congestion in your nose or some drainage.  This is from the oxygen used during your procedure.  There is no need for concern and it should clear up in a day or so.  SYMPTOMS TO REPORT IMMEDIATELY:   Following lower endoscopy (colonoscopy or flexible sigmoidoscopy):  Excessive amounts of blood in the stool  Significant tenderness or worsening of abdominal pains  Swelling of the abdomen that is new, acute  Fever of 100F or higher   Following upper endoscopy (EGD)  Vomiting of blood or coffee ground material  New chest pain or pain under the shoulder blades  Painful or persistently difficult swallowing  New shortness of breath  Fever of 100F or higher  Black, tarry-looking stools  For urgent or emergent issues, a gastroenterologist can  be reached at any hour by calling [phone].   DIET:  We do recommend a small meal at first, but then you may proceed to your regular diet.  Drink plenty of fluids but you should avoid alcoholic beverages for 24 hours.  ACTIVITY:  You should plan to take it easy for the rest of today and you should NOT DRIVE or use heavy machinery until tomorrow (because of the sedation medicines used during the test).    FOLLOW UP: Our staff will call the number listed on your records 48-72 hours following your procedure to check on you and address any questions or concerns that you may have regarding the information given to you following your procedure. If we do not reach you, we will leave a message.  We will attempt to reach you two times.  During this call, we will ask if you have developed any symptoms of COVID 19. If you develop any symptoms (ie: fever, flu-like symptoms, shortness of breath, cough etc.) before then, please call [phone].  If you test positive for Covid 19 in the 2 weeks post procedure, please call and report this information to [place].    If any biopsies were taken you will be contacted by phone or by letter within the next 1-3 weeks.  Please call us at [phone] if you have not heard about the biopsies in 3 weeks.    SIGNATURES/CONFIDENTIALITY: You and/or your care partner have signed paperwork which will be  entered into your electronic medical record.  These signatures attest to the fact that that the information above on your After Visit Summary has been reviewed and is understood.  Full responsibility of the confidentiality of this discharge information lies with you and/or your care-partner.YOU HAD AN ENDOSCOPIC PROCEDURE TODAY AT [facility]:   Refer to the procedure report that was given to you for any specific questions about what was found during the examination.  If the procedure report does not answer your questions, please call your gastroenterologist to  clarify.  If you requested that your care partner not be given the details of your procedure findings, then the procedure report has been included in a sealed envelope for you to review at your convenience later.

## 2019-05-21 NOTE — Progress Notes (Signed)
Called to room to assist during endoscopic procedure.  Patient ID and intended procedure confirmed with present staff. Received instructions for my participation in the procedure from the performing physician.  

## 2019-05-21 NOTE — Op Note (Signed)
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 The patient's tolerance of                            previous anesthesia was also reviewed. The risks                            and benefits of the procedure and the sedation                            options and risks were discussed with the patient.                            All questions were answered, and informed consent                            was obtained. Prior Anticoagulants: The patient has                            taken no previous anticoagulant or antiplatelet                            agents. ASA Grade Assessment: III - A patient with                            severe systemic disease. After reviewing the risks                            and benefits, the patient was deemed in                            satisfactory condition to undergo the procedure.  After obtaining informed consent, the colonoscope                            was passed under direct vision. Throughout the                            procedure, the patient's blood pressure, pulse, and                            oxygen saturations were monitored continuously. The                            Colonoscope was introduced through the anus and                            advanced to the the  cecum, identified by                            appendiceal orifice and ileocecal valve. The                            colonoscopy was performed without difficulty. The                            patient tolerated the procedure well. The quality                            of the bowel preparation was excellent. The                            ileocecal valve, appendiceal orifice, and rectum                            were photographed. Scope In: 9:02:22 AM Scope Out: 9:21:06 AM Scope Withdrawal Time: 0 hours 12 minutes 2 seconds  Total Procedure Duration: 0 hours 18 minutes 44 seconds  Findings:                 The perianal and digital rectal examinations were                            normal.                           Two sessile polyps were found in the transverse                            colon. The polyps were 4 to 7 mm in size. These                            polyps were removed with a cold snare. Resection                            and retrieval were complete.  Two sessile polyps were found in the transverse                            colon. The polyps were 1 to 2 mm in size. These                            polyps were removed with a cold biopsy forceps.                            Resection and retrieval were complete.                           Scattered small and large-mouthed diverticula were                            found in the sigmoid colon, descending colon,                            transverse colon and ascending colon.                           Non-bleeding internal hemorrhoids were found during                            retroflexion. The hemorrhoids were small.                           The exam was otherwise without abnormality on                            direct and retroflexion views. Complications:            No immediate complications. Estimated Blood Loss:     Estimated blood loss was minimal. Impression:               - Two 4 to 7  mm polyps in the transverse colon,                            removed with a cold snare. Resected and retrieved.                           - Two 1 to 2 mm polyps in the transverse colon,                            removed with a cold biopsy forceps. Resected and                            retrieved.                           - Mild diverticulosis in the sigmoid colon, in the                            descending colon, in the transverse colon and in  the ascending colon.                           - Non-bleeding internal hemorrhoids.                           - The examination was otherwise normal on direct                            and retroflexion views. Recommendation:           - Patient has a contact number available for                            emergencies. The signs and symptoms of potential                            delayed complications were discussed with the                            patient. Return to normal activities tomorrow.                            Written discharge instructions were provided to the                            patient.                           - Resume previous diet.                           - Continue present medications.                           - Await pathology results.                           - Repeat colonoscopy in 3 years for surveillance. Mauri Pole, MD 05/21/2019 9:27:37 AM This report has been signed electronically.

## 2019-05-25 ENCOUNTER — Telehealth: Payer: Self-pay

## 2019-05-25 NOTE — Telephone Encounter (Signed)
  Follow up Call-  Call back number 05/21/2019 02/10/2018  Post procedure Call Back phone  # [phone]  Permission to leave phone message Yes Yes  Some recent data might be hidden     Patient questions:  Do you have a fever, pain , or abdominal swelling? No. Pain Score  0 *  Have you tolerated food without any problems? Yes.    Have you been able to return to your normal activities? Yes.    Do you have any questions about your discharge instructions: Diet   No. Medications  No. Follow up visit  No.  Do you have questions or concerns about your Care? No.  Actions: * If pain score is 4 or above: No action needed, pain <4.  1. Have you developed a fever since your procedure? no  2.   Have you had an respiratory symptoms (SOB or cough) since your procedure? no  3.   Have you tested positive for COVID 19 since your procedure no  4.   Have you had any family members/close contacts diagnosed with the COVID 19 since your procedure?  no   If yes to any of these questions please route to Joylene John, RN and Alphonsa Gin, Therapist, sports.

## 2019-06-02 ENCOUNTER — Encounter: Payer: Self-pay | Admitting: Gastroenterology

## 2019-09-16 ENCOUNTER — Ambulatory Visit (INDEPENDENT_AMBULATORY_CARE_PROVIDER_SITE_OTHER): Payer: Medicare HMO | Admitting: Internal Medicine

## 2019-09-16 ENCOUNTER — Other Ambulatory Visit: Payer: Self-pay

## 2019-09-16 ENCOUNTER — Encounter: Payer: Self-pay | Admitting: Internal Medicine

## 2019-09-16 VITALS — BP 106/72 | HR 89 | Ht 61.5 in | Wt 226.0 lb

## 2019-09-16 DIAGNOSIS — G4733 Obstructive sleep apnea (adult) (pediatric): Secondary | ICD-10-CM

## 2019-09-16 NOTE — Patient Instructions (Addendum)
Order- referral to Orthodontist Dr Ron Parker- consider oral appliance for OSA  Order- DME [facility]- please reduce CPAP pressure to 9 Continue mask of choice, humidifier, supplies, AirView/ card  Order- refer to sleep center for CPAP desensitization and mask fitting  Please call if we can help

## 2019-09-16 NOTE — Progress Notes (Signed)
09/16/19- 48 yoF Smoker referred by Dr Darlina Sicilian for OSA Medical problem list includes Seasonal Allergy, Bipolar, Low Back Pain/ Lumbar Disc disease, DM2/ neuropathy, Hy[perlipidemeia, Tobacco use,  NPSG 06/22/13 AHI 23.4/ hr, desaturation to 82%, body weight 226 lbs CPAP 10/ [facility] Download compliance 47%, AHI 3.9/ hr      She had been seen here in 2017, 2018, but never sustained good CPAP compliance.  She complained to her PCP of low energy. Long term use of oxycodone. Body weight today 226 lbs Epworth score 3 2 Phizer Covax Brings DOT form. Smoking 1/2 PPD. Says CPAP sometimes blows to hard. Nasal pillow mask preferred due to less claustrophobic size, but leaks easily with tossing and turning.   Prior to Admission medications   Medication Sig Start Date End Date Taking? Authorizing Provider  ACCU-CHEK AVIVA PLUS test strip daily. for testing 11/27/17  Yes [provider]  ACCU-CHEK SOFTCLIX LANCETS lancets daily. for testing 12/01/17  Yes [provider]  albuterol (PROAIR HFA) 108 (90 Base) MCG/ACT inhaler Inhale 2 puffs into the lungs every 6 (six) hours as needed for wheezing.  07/11/15  Yes [provider]  clobetasol ointment (TEMOVATE) 1.77 % Apply 1 application topically as needed.  10/02/17  Yes [provider]  diclofenac sodium (VOLTAREN) 1 % GEL Apply 4 g topically 4 (four) times daily as needed (for pain).  10/02/17  Yes [provider]  fluticasone (FLONASE) 50 MCG/ACT nasal spray Place 2 sprays into both nostrils daily. Patient taking differently: Place 2 sprays into both nostrils as needed for allergies.  06/03/13  Yes Regalado, Belkys A, MD  furosemide (LASIX) 20 MG tablet Take 1 tablet by mouth daily.   Yes [provider]  lithium carbonate 300 MG capsule Take 300 mg by mouth daily.   Yes [provider]  lubiprostone (AMITIZA) 24 MCG capsule Take 1 capsule (24 mcg total) by mouth 2 (two) times daily with a meal.  03/09/19  Yes Nandigam, Kavitha V, MD  LYRICA 150 MG capsule Take 150 mg by mouth 3 (three) times daily. 10/02/17  Yes [provider]  meclizine (ANTIVERT) 12.5 MG tablet Take 1 tablet by mouth 3 (three) times daily as needed.   Yes [provider]  Misc Natural Products (T-RELIEF CBD+13 SL) Place 1 vial under the tongue 2 (two) times daily as needed (for pain).   Yes [provider]  ondansetron (ZOFRAN-ODT) 4 MG disintegrating tablet Take 4 mg by mouth as needed for nausea.  07/28/17  Yes [provider]  oxyCODONE-acetaminophen (PERCOCET) 7.5-325 MG tablet Take 1 tablet by mouth every 6 (six) hours as needed. 04/15/19  Yes [provider]  potassium chloride SA (K-DUR,KLOR-CON) 20 MEQ tablet Take 20 mEq by mouth daily.   Yes [provider]  simvastatin (ZOCOR) 10 MG tablet Take 10 mg by mouth daily.  05/29/13  Yes [provider]  simvastatin (ZOCOR) 40 MG tablet Take 1 tablet by mouth at bedtime. 04/10/19  Yes [provider]  tiZANidine (ZANAFLEX) 4 MG tablet Take 4 mg by mouth 2 (two) times daily as needed for muscle spasms.  10/20/17  Yes [provider]  topiramate (TOPAMAX) 50 MG tablet Take 50 mg by mouth daily.    Yes [provider]  VICTOZA 18 MG/3ML SOPN Inject 0.6 mg into the skin daily.  07/29/14  Yes [provider]  NARCAN 4 MG/0.1ML LIQD nasal spray kit  08/10/19   [provider]   Past  Medical History:  Diagnosis Date  . Allergy   . Anxiety   . Arthritis    "knees, legs, back" (06/01/2013)  . Asthma   . Bipolar depression ([place])   . Cataract   . Chronic lower back pain   . Constipation, chronic   . Depression   . High cholesterol   . Irritable bowel syndrome (IBS)   . OSA (obstructive sleep apnea)   . Osteoporosis   . Pneumonia 1970's; 1980's; 06/01/2013   "total of 3 times"  . Sleep apnea    cpap  . Type II diabetes mellitus ([place])    Past Surgical History:   Procedure Laterality Date  . ABDOMINAL HYSTERECTOMY  1982  . BACK SURGERY    . CARPAL TUNNEL RELEASE     right wrist- with a nerve block placed January 2020  . CHOLECYSTECTOMY  1990's  . COLONOSCOPY    . FOREARM FRACTURE SURGERY Left 1999   "put in plates"  . IMPLANTATION VAGAL NERVE STIMULATOR  2013  . POSTERIOR LUMBAR FUSION  2000's  . SHOULDER OPEN ROTATOR CUFF REPAIR Right 2000  . TUBAL LIGATION  1978  . VAGAL NERVE STIMULATOR REMOVAL  2013   "got an infection in there" (06/01/2013)   Family History  Problem Relation Age of Onset  . Kidney failure Mother   . Stomach cancer Sister   . Colon cancer Neg Hx   . Esophageal cancer Neg Hx   . Rectal cancer Neg Hx    Social History   Socioeconomic History  . Marital status: Divorced    Spouse name: Not on file  . Number of children: 3  . Years of education: Not on file  . Highest education level: Not on file  Occupational History  . Occupation: disabled  Tobacco Use  . Smoking status: Current Every Day Smoker    Packs/day: 0.50    Years: 45.00    Pack years: 22.50    Types: Cigarettes  . Smokeless tobacco: Never Used  . Tobacco comment: 1 pack per day   Substance and Sexual Activity  . Alcohol use: Yes    Comment: occasional  . Drug use: Yes    Types: Marijuana    Comment: last used "last week" per pt  . Sexual activity: Not Currently  Other Topics Concern  . Not on file  Social History Narrative  . Not on file   Social Determinants of Health   Financial Resource Strain:   . Difficulty of Paying Living Expenses:   Food Insecurity:   . Worried About Charity fundraiser in the Last Year:   . Arboriculturist in the Last Year:   Transportation Needs: No Transportation Needs  . Lack of Transportation (Medical): No  . Lack of Transportation (Non-Medical): No  Physical Activity:   . Days of Exercise per Week:   . Minutes of Exercise per Session:   Stress:   . Feeling of Stress :   Social Connections:   .  Frequency of Communication with Friends and Family:   . Frequency of Social Gatherings with Friends and Family:   . Attends Religious Services:   . Active Member of Clubs or Organizations:   . Attends Archivist Meetings:   Marland Kitchen Marital Status:   Intimate Partner Violence: Not At Risk  . Fear of Current or Ex-Partner: No  . Emotionally Abused: No  . Physically Abused: No  . Sexually Abused: No    ROS-see HPI   + =  positive Constitutional:    weight loss, night sweats, fevers, chills, +fatigue, lassitude. HEENT:    headaches, difficulty swallowing, tooth/dental problems, sore throat,       sneezing, itching, ear ache, nasal congestion, post nasal drip, snoring CV:    chest pain, orthopnea, PND, swelling in lower extremities, anasarca,                                  dizziness, palpitations Resp:   shortness of breath with exertion or at rest.                productive cough,   non-productive cough, coughing up of blood.              change in color of mucus.  wheezing.   Skin:    rash or lesions. GI:  No-   heartburn, indigestion, abdominal pain, nausea, vomiting, diarrhea,                 change in bowel habits, loss of appetite GU: dysuria, change in color of urine, no urgency or frequency.   flank pain. MS:   joint pain, stiffness, decreased range of motion, back pain. Neuro-     nothing unusual Psych:  change in mood or affect.  depression or anxiety.   memory loss.  OBJ- Physical Exam General- Alert, Oriented, Affect-appropriate, Distress- none acute, + obese Skin- rash-none, lesions- none, excoriation- none Lymphadenopathy- none Head- atraumatic            Eyes- Gross vision intact, PERRLA, conjunctivae and secretions clear            Ears- Hearing, canals-normal            Nose- Clear, no-Septal dev, mucus, polyps, erosion, perforation             Throat- Mallampati III-IV , mucosa clear , drainage- none, tonsils- atrophic, + missing teeth Neck- flexible , trachea  midline, no stridor , thyroid nl, carotid no bruit Chest - symmetrical excursion , unlabored           Heart/CV- RRR , no murmur , no gallop  , no rub, nl s1 s2                           - JVD- none , edema- none, stasis changes- none, varices- none           Lung- clear to P&A, wheeze- none, cough- none , dullness-none, rub- none           Chest wall-  Abd-  Br/ Gen/ Rectal- Not done, not indicated Extrem- cyanosis- none, clubbing, none, atrophy- none, strength- nl Neuro- grossly intact to observation

## 2019-09-17 NOTE — Assessment & Plan Note (Signed)
Not sure if we can help her get comfortable with CPAP. Plan- CPAP desensitization and mask fit, reduce peak pressure to 9.   She may be missing too many teeth, but I would her to learn about oral appliance options with Dr Ron Parker.

## 2019-09-17 NOTE — Assessment & Plan Note (Signed)
I explained she is too heavy to be considered a candidate for a nerve-stimulator for OSA now. Encouraged her to work on diet and exercise.

## 2019-10-01 ENCOUNTER — Telehealth: Payer: Self-pay

## 2019-10-01 NOTE — Telephone Encounter (Signed)
I did a prior authorization thru [employer] for patients Amitiza 24 mcg , one capsule twice daily for her chronic constipation K59.09, and IBS K58.9. This has been approved thru 04/28/2020. I will let [facility] know.

## 2019-10-16 ENCOUNTER — Other Ambulatory Visit (HOSPITAL_COMMUNITY): Payer: Medicare HMO

## 2019-10-18 ENCOUNTER — Other Ambulatory Visit (HOSPITAL_BASED_OUTPATIENT_CLINIC_OR_DEPARTMENT_OTHER): Payer: Medicare HMO | Admitting: Internal Medicine

## 2019-11-06 ENCOUNTER — Other Ambulatory Visit (HOSPITAL_COMMUNITY): Payer: Medicare HMO

## 2019-11-09 ENCOUNTER — Ambulatory Visit (HOSPITAL_BASED_OUTPATIENT_CLINIC_OR_DEPARTMENT_OTHER): Payer: Medicare HMO | Attending: Internal Medicine | Admitting: Internal Medicine

## 2019-11-09 ENCOUNTER — Other Ambulatory Visit: Payer: Self-pay

## 2019-11-09 DIAGNOSIS — G4733 Obstructive sleep apnea (adult) (pediatric): Secondary | ICD-10-CM

## 2019-11-27 IMAGING — MR MRI LUMBAR SPINE WITHOUT AND WITH CONTRAST
4 of 7 series · 17 of 48 positions shown · IV contrast (multihance)
Comparison: Radiographs dated 03/17/2018 and CT scan of the abdomen
and pelvis dated 08/27/2017

CLINICAL DATA: Severe back pain. Leg and hip pain. Previous
interbody and posterior fusion at L5-S1.

Creatinine was obtained on site at [HOSPITAL] at [HOSPITAL].
Results: Creatinine 1.0 mg/dL.
EXAM:
MRI LUMBAR SPINE WITHOUT AND WITH CONTRAST
TECHNIQUE: Multiplanar and multiecho pulse sequences of the lumbar spine were
obtained without and with intravenous contrast.
CONTRAST:  20mL MULTIHANCE GADOBENATE DIMEGLUMINE 529 MG/ML IV SOLN

[Series 6: T1 · sagittal · 4.0mm · 0.73mm/px · 3 of 15 slices shown (1 of 2)]
[im 1/15]
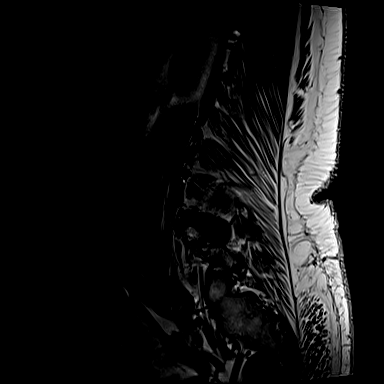
[im 8/15]
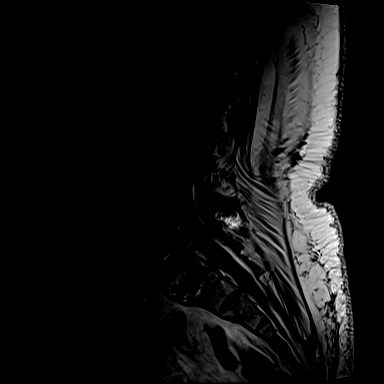
[im 15/15]
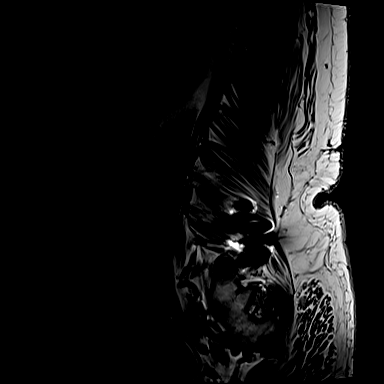

[Series 8: T2 · sagittal · 4.0mm · 0.73mm/px · 4 of 15 slices shown (1 of 2)]
[im 1/15]
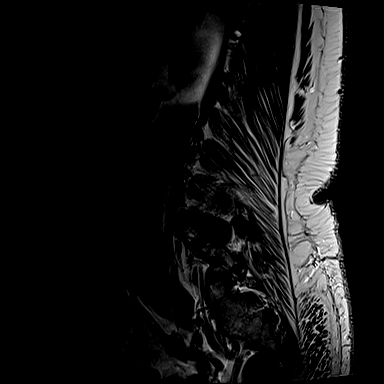
[im 5/15]
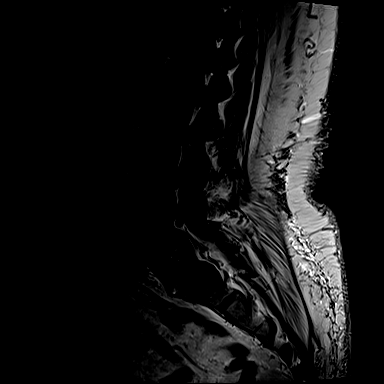
[im 10/15]
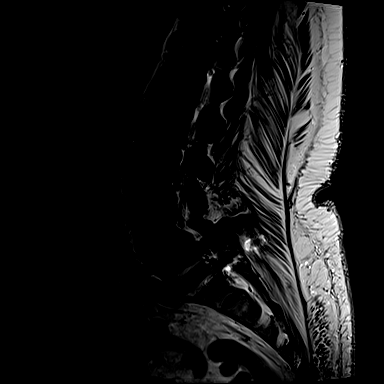
[im 15/15]
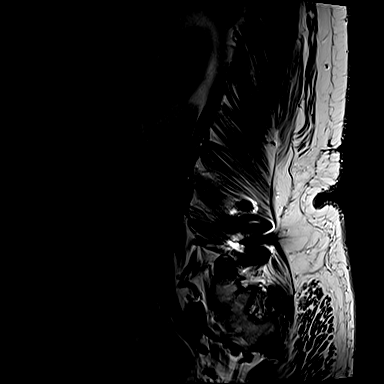

[Series 11: T2 · axial · 4.0mm · 0.28mm/px · z∈[-86,+127]mm · 7 of 41 slices shown (2 of 2)]
[im 1/41]
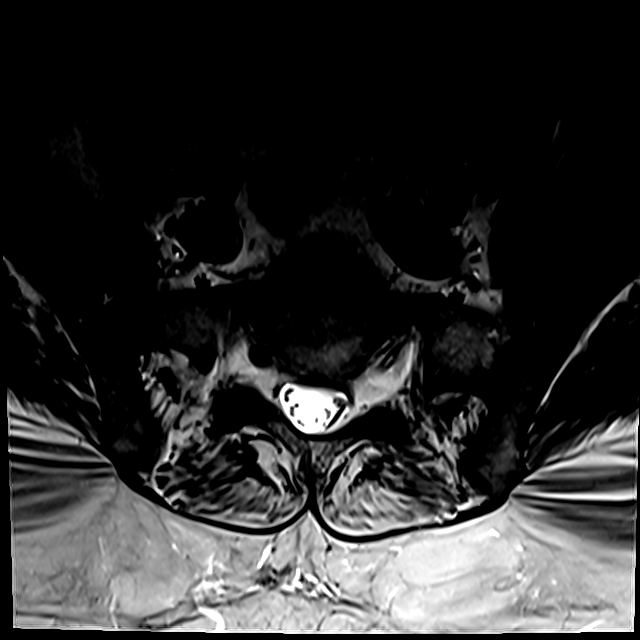
[im 5/41]
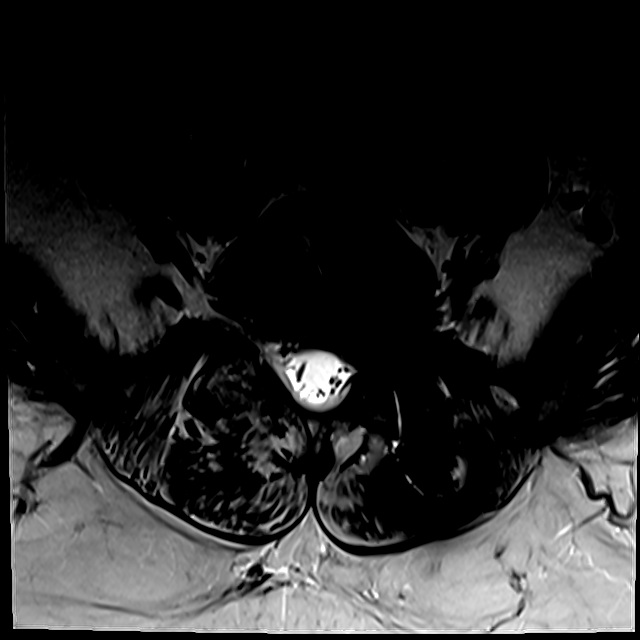
[im 9/41]
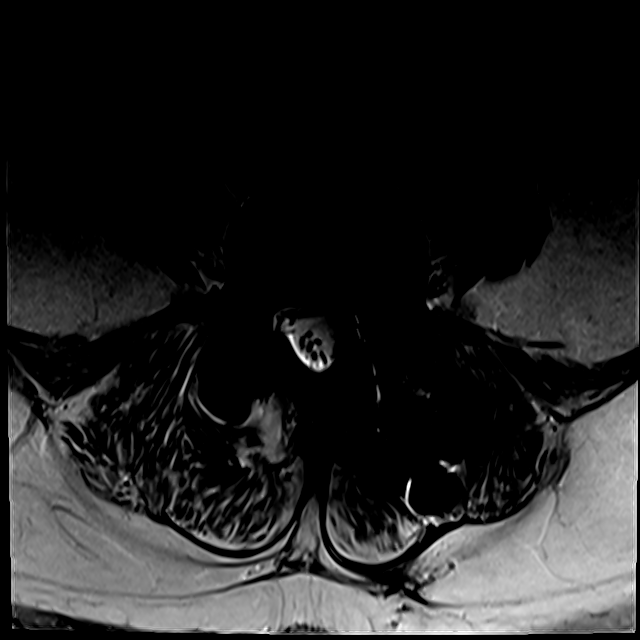
[im 13/41]
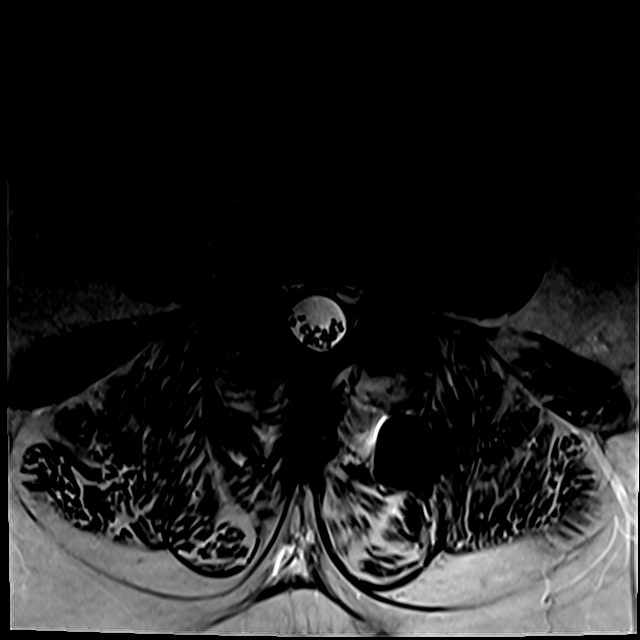
[im 17/41]
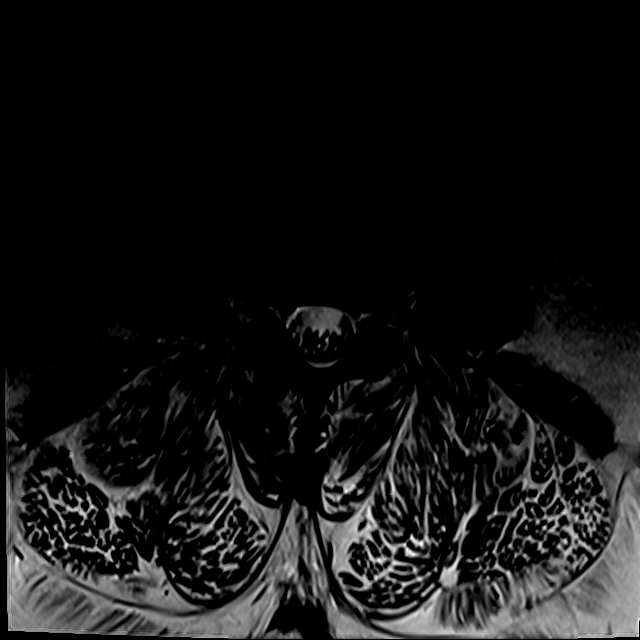
[im 21/41]
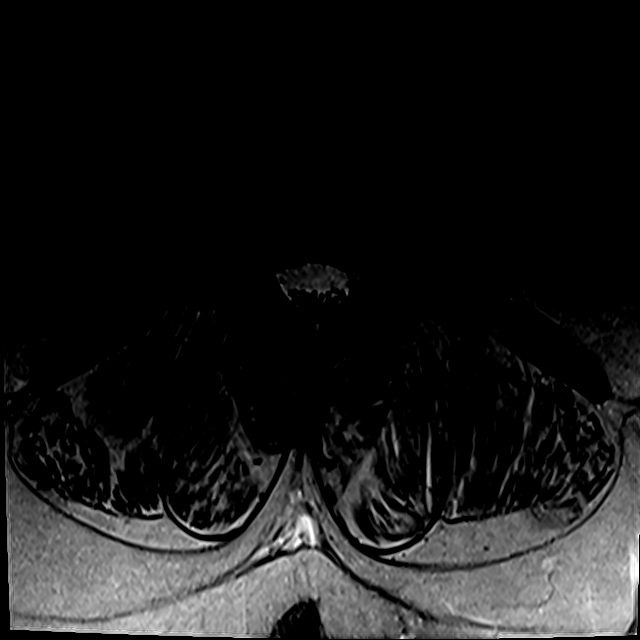
[im 37/41]
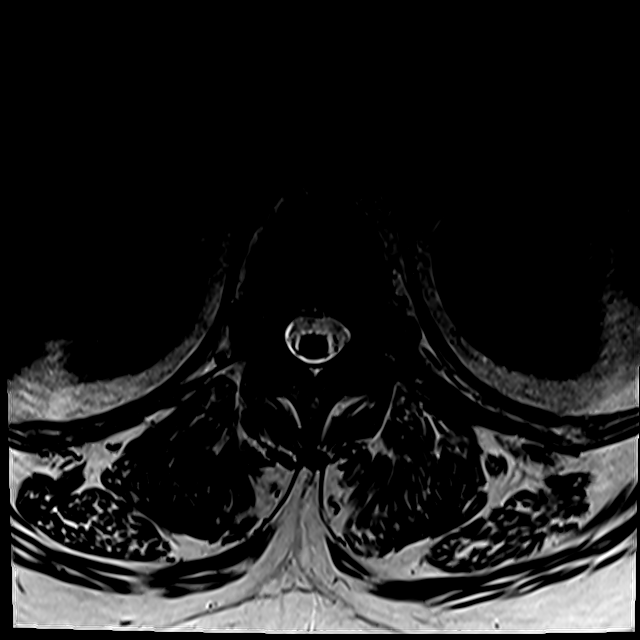

[Series 14: T1 · axial · 4.0mm · 0.28mm/px · z∈[-67,+127]mm · 3 of 41 slices shown (2 of 2)]
[im 5/41]
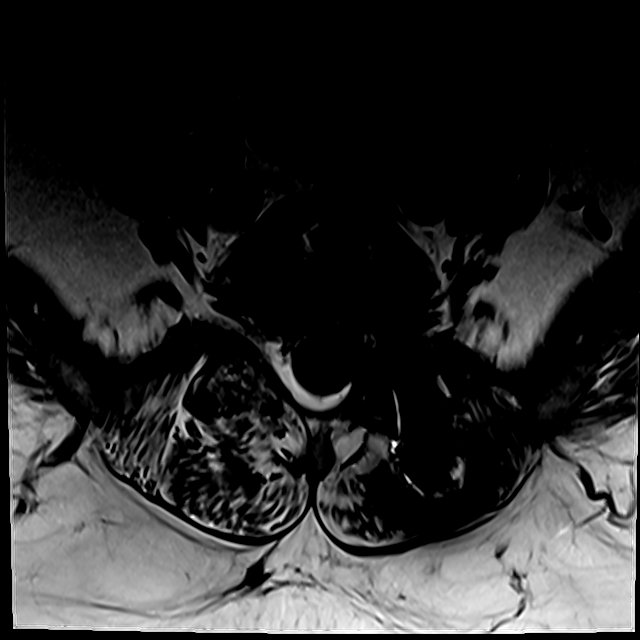
[im 21/41]
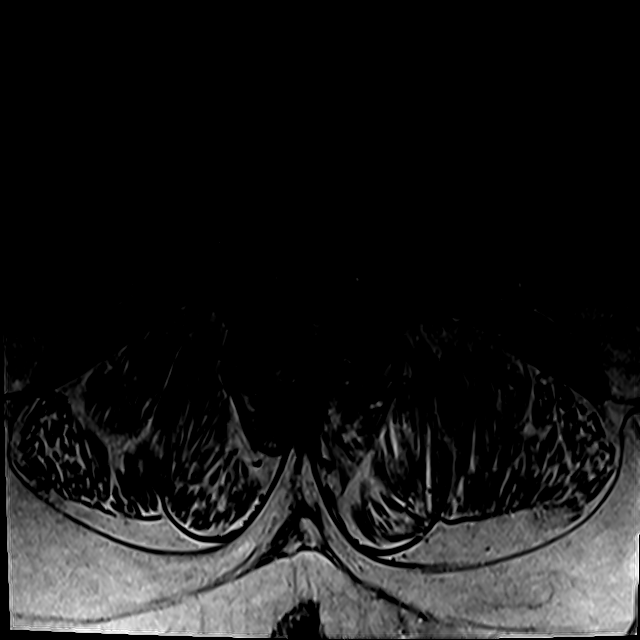
[im 37/41]
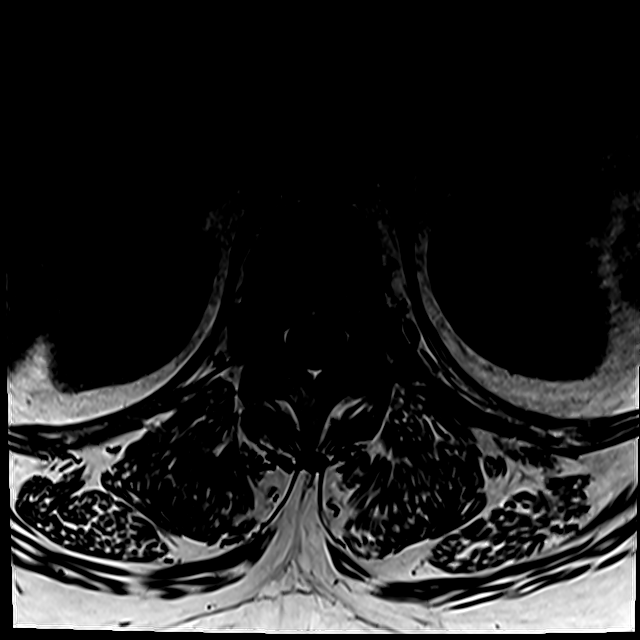

[17 of 48 positions shown; findings below may reference images not displayed]

FINDINGS: Segmentation:  Standard.

Alignment: 2 mm spondylolisthesis at L4-5. Chronic 2 mm
spondylolisthesis at L5-S1.

Vertebrae: Solid interbody and posterior fusion at L5-S1. Chronic
progressive prominent Schmorl's node in the superior endplate of L1
with slight enhancement in the Schmorl's node after contrast
administration.

Conus medullaris and cauda equina: Conus extends to the L2-3 level.
Conus and cauda equina appear normal.

Paraspinal and other soft tissues: Simple appearing 24 mm 9 mm cysts
in the right kidney. Paraspinal soft tissues are otherwise normal.

Disc levels:

T11-12: Disc space narrowing. Small broad-based disc bulge with no
neural impingement.

T12-L1: Disc space narrowing tiny broad-based disc bulge with no
neural impingement. Chronic prominent Schmorl's node superior
endplate of L1.

L1-2: Negative.

L2-3: Normal disc.  Slight right facet arthritis.

L3-4: Normal disc.  Slight right facet arthritis.

L4-5: Severe bilateral facet arthritis. 2 mm spondylolisthesis. Tiny
central bulge of the disc with no neural impingement.

L5-S1: Solid interbody and posterior fusion with no residual
impingement. Pedicle screws on the left at L5-S1.

After contrast administration there is slight enhancement right
facet joint and L4-5 consistent with inflammation due to the
arthritis.
IMPRESSION: 1. Severe bilateral facet arthritis at L4-5 with inflammation as
demonstrated by enhancement around the right facet joint at L4-5
after contrast.
2. Solid fusion at L5-S1 with no residual impingement.
3. Chronic progressive Schmorl's node in the superior endplate of
L1.

## 2019-11-29 ENCOUNTER — Ambulatory Visit
Admission: RE | Admit: 2019-11-29 | Discharge: 2019-11-29 | Disposition: A | Discharge status: Home / Self Care | Payer: Medicare HMO | Source: Ambulatory Visit | Attending: Family Medicine | Admitting: Family Medicine

## 2019-11-29 ENCOUNTER — Other Ambulatory Visit: Payer: Self-pay

## 2019-11-29 ENCOUNTER — Other Ambulatory Visit: Payer: Self-pay | Admitting: Family Medicine

## 2019-11-29 DIAGNOSIS — R05 Cough: Secondary | ICD-10-CM

## 2019-11-29 DIAGNOSIS — R059 Cough, unspecified: Secondary | ICD-10-CM

## 2020-01-17 ENCOUNTER — Encounter: Payer: Self-pay | Admitting: Internal Medicine

## 2020-01-17 ENCOUNTER — Other Ambulatory Visit: Payer: Self-pay

## 2020-01-17 ENCOUNTER — Ambulatory Visit (INDEPENDENT_AMBULATORY_CARE_PROVIDER_SITE_OTHER): Payer: Medicare HMO | Admitting: Internal Medicine

## 2020-01-17 VITALS — BP 100/60 | HR 99 | Temp 96.9°F | Ht 63.0 in | Wt 219.6 lb

## 2020-01-17 DIAGNOSIS — J209 Acute bronchitis, unspecified: Secondary | ICD-10-CM | POA: Diagnosis not present

## 2020-01-17 DIAGNOSIS — G4733 Obstructive sleep apnea (adult) (pediatric): Secondary | ICD-10-CM | POA: Diagnosis not present

## 2020-01-17 DIAGNOSIS — Z720 Tobacco use: Secondary | ICD-10-CM | POA: Diagnosis not present

## 2020-01-17 NOTE — Progress Notes (Signed)
HPI F smoker followed for OSA, complicated by Seasonal Allergy, Bipolar, Low Back Pain/ Lumbar Disc disease, DM2/ neuropathy, Hy[perlipidemeia, Tobacco use,  NPSG 06/22/13 AHI 23.4/ hr, desaturation to 82%, body weight 226 lbs  --------------------------------------------------------------------------------------------  09/16/19- 68 yoF Smoker referred by Dr Darlina Sicilian for OSA Medical problem list includes Seasonal Allergy, Bipolar, Low Back Pain/ Lumbar Disc disease, DM2/ neuropathy, Hy[perlipidemeia, Tobacco use,  NPSG 06/22/13 AHI 23.4/ hr, desaturation to 82%, body weight 226 lbs CPAP 10/ [employer] Download compliance 47%, AHI 3.9/ hr      She had been seen here in 2017, 2018, but never sustained good CPAP compliance.  She complained to her PCP of low energy. Long term use of oxycodone. Body weight today 226 lbs Epworth score 3 2 Phizer Covax Brings DOT form. Smoking 1/2 PPD. Says CPAP sometimes blows too hard. Nasal pillow mask preferred due to less claustrophobic size, but leaks easily with tossing and turning.   01/17/20- 42 yoF Smoker followed for OSA, complicated by Seasonal Allergy, Bipolar 1, Low Back Pain/ Lumbar Disc disease, DM2/ neuropathy, Hy[perlipidemeia, Tobacco use, Obesity,  Referred to Dr Ron Parker for oral appliance in May.- went back to CPAP CPAP 9/ [employer] Desensitization mask fit 7/13. Download- compliance 23%, AHI 3.9/ hr. Body weight today- 219 lbs Covid vax- 2 Moderna Flu vax- declines- cites hx reaction to high dose vaccine in past, may choose to get standard strength. Had bronchitis 2 weeks, missed CPAP then. Gradually improving, using albuterol inhaler each morning and back to using CPAP. Still some DOE. Likes current pressure setting. CXR 11/29/19- IMPRESSION: Peribronchial cuffing suggesting bronchitis. No focal consolidation.  // Consider PFT, smoking counseling//   ROS-see HPI   + = positive Constitutional:    weight loss, night sweats, fevers, chills,  +fatigue, lassitude. HEENT:    headaches, difficulty swallowing, tooth/dental problems, sore throat,       sneezing, itching, ear ache, nasal congestion, post nasal drip, snoring CV:    chest pain, orthopnea, PND, swelling in lower extremities, anasarca,                                   dizziness, palpitations Resp:   shortness of breath with exertion or at rest.                productive cough,   non-productive cough, coughing up of blood.              change in color of mucus.  wheezing.   Skin:    rash or lesions. GI:  No-   heartburn, indigestion, abdominal pain, nausea, vomiting, diarrhea,                 change in bowel habits, loss of appetite GU: dysuria, change in color of urine, no urgency or frequency.   flank pain. MS:   joint pain, stiffness, decreased range of motion, back pain. Neuro-     nothing unusual Psych:  change in mood or affect.  depression or anxiety.   memory loss.  OBJ- Physical Exam General- Alert, Oriented, Affect-appropriate, Distress- none acute, + obese Skin- rash-none, lesions- none, excoriation- none Lymphadenopathy- none Head- atraumatic            Eyes- Gross vision intact, PERRLA, conjunctivae and secretions clear            Ears- Hearing, canals-normal            Nose- Clear,  no-Septal dev, mucus, polyps, erosion, perforation             Throat- Mallampati III-IV , mucosa clear , drainage- none, tonsils- atrophic, + missing teeth Neck- flexible , trachea midline, no stridor , thyroid nl, carotid no bruit Chest - symmetrical excursion , unlabored           Heart/CV- RRR , no murmur , no gallop  , no rub, nl s1 s2                           - JVD- none , edema- none, stasis changes- none, varices- none           Lung- clear to P&A, wheeze- none, cough- none , dullness-none, rub- none           Chest wall-  Abd-  Br/ Gen/ Rectal- Not done, not indicated Extrem- cyanosis- none, clubbing, none, atrophy- none, strength- nl Neuro- grossly intact to  observation

## 2020-01-17 NOTE — Patient Instructions (Signed)
We can continue CPAP 9  Please call if we can help.  

## 2020-01-25 ENCOUNTER — Encounter: Payer: Self-pay | Admitting: Internal Medicine

## 2020-01-25 DIAGNOSIS — J209 Acute bronchitis, unspecified: Secondary | ICD-10-CM | POA: Insufficient documentation

## 2020-01-25 NOTE — Assessment & Plan Note (Signed)
Resolving acute illness Anticipate need for PFT to define status in this smoker

## 2020-01-25 NOTE — Assessment & Plan Note (Signed)
Benefits from CPA and has settled in to using it. Plan- continue 9 cwp

## 2020-01-25 NOTE — Assessment & Plan Note (Signed)
Encouraged a seripus effort to stop smoking.

## 2020-03-27 ENCOUNTER — Other Ambulatory Visit: Payer: Self-pay | Admitting: *Deleted

## 2020-03-27 MED ORDER — LUBIPROSTONE 24 MCG PO CAPS: ORAL_CAPSULE | ORAL | 3 refills | Status: DC

## 2020-03-27 NOTE — Progress Notes (Signed)
Refill request fax from pharmacy for [place]

## 2020-04-26 ENCOUNTER — Other Ambulatory Visit: Payer: Self-pay | Admitting: Family Medicine

## 2020-04-26 DIAGNOSIS — Z1231 Encounter for screening mammogram for malignant neoplasm of breast: Secondary | ICD-10-CM

## 2020-06-06 ENCOUNTER — Ambulatory Visit
Admission: RE | Admit: 2020-06-06 | Discharge: 2020-06-06 | Disposition: A | Discharge status: Home / Self Care | Payer: Medicare HMO | Source: Ambulatory Visit | Attending: Family Medicine | Admitting: Family Medicine

## 2020-06-06 ENCOUNTER — Ambulatory Visit: Payer: Medicare HMO

## 2020-06-06 ENCOUNTER — Other Ambulatory Visit: Payer: Self-pay

## 2020-06-06 DIAGNOSIS — Z1231 Encounter for screening mammogram for malignant neoplasm of breast: Secondary | ICD-10-CM

## 2020-06-22 ENCOUNTER — Telehealth: Payer: Self-pay | Admitting: *Deleted

## 2020-06-22 MED ORDER — LUBIPROSTONE 24 MCG PO CAPS: ORAL_CAPSULE | ORAL | 3 refills | Status: DC

## 2020-06-22 NOTE — Telephone Encounter (Signed)
Fax refill request from pharmacy  Refilled Amitiza electronically today

## 2020-12-04 ENCOUNTER — Telehealth: Payer: Self-pay | Admitting: Internal Medicine

## 2020-12-04 NOTE — Telephone Encounter (Signed)
disregard

## 2020-12-05 ENCOUNTER — Ambulatory Visit
Admission: RE | Admit: 2020-12-05 | Discharge: 2020-12-05 | Disposition: A | Discharge status: Home / Self Care | Payer: Medicare HMO | Source: Ambulatory Visit | Attending: Family Medicine | Admitting: Family Medicine

## 2020-12-05 ENCOUNTER — Other Ambulatory Visit: Payer: Self-pay | Admitting: Internal Medicine

## 2020-12-05 ENCOUNTER — Other Ambulatory Visit: Payer: Self-pay

## 2020-12-05 DIAGNOSIS — R079 Chest pain, unspecified: Secondary | ICD-10-CM

## 2020-12-28 ENCOUNTER — Telehealth: Payer: Self-pay | Admitting: Internal Medicine

## 2020-12-28 NOTE — Telephone Encounter (Signed)
Called and spoke with patient. She was asking for a copy of her cpap download. I found her info in Melanie Reynolds but the data stopped in April 2022. She stated that she does have a SD card in the machine. I advised her to bring the card to the office and we can download the information so she can have a copy of the report. She verbalized understanding.   Nothing further needed at time of call.

## 2021-01-01 ENCOUNTER — Encounter: Payer: Self-pay | Admitting: Internal Medicine

## 2021-01-02 ENCOUNTER — Telehealth: Payer: Self-pay | Admitting: Internal Medicine

## 2021-01-04 NOTE — Telephone Encounter (Signed)
DL and SD card placed up front for pick up  Nothing further needed per pt

## 2021-01-15 NOTE — Progress Notes (Signed)
HPI F smoker followed for OSA, complicated by Seasonal Allergy, Bipolar, Low Back Pain/ Lumbar Disc disease, DM2/ neuropathy, Hy[perlipidemeia, Tobacco use,  NPSG 06/22/13 AHI 23.4/ hr, desaturation to 82%, body weight 226 lbs  --------------------------------------------------------------------------------------------  01/17/20- 68 yoF Smoker followed for OSA, complicated by Seasonal Allergy, Bipolar 1, Low Back Pain/ Lumbar Disc disease, DM2/ neuropathy, Hy[perlipidemeia, Tobacco use, Obesity,  Referred to Dr Ron Parker for oral appliance in May.- went back to CPAP CPAP 9/ [employer] Desensitization mask fit 7/13. Download- compliance 23%, AHI 3.9/ hr. Body weight today- 219 lbs Covid vax- 2 Moderna Flu vax- declines- cites hx reaction to high dose vaccine in past, may choose to get standard strength. Had bronchitis 2 weeks, missed CPAP then. Gradually improving, using albuterol inhaler each morning and back to using CPAP. Still some DOE. Likes current pressure setting. CXR 11/29/19- IMPRESSION: Peribronchial cuffing suggesting bronchitis. No focal consolidation.  01/16/21- 89yoF Smoker (22.5 pk yrs) followed for OSA, complicated by Seasonal Allergy, Bipolar 1, Low Back Pain/ Lumbar Disc disease, DM2/ neuropathy, Hyperlipidemia, Tobacco use, Obesity,  Referred to Dr Ron Parker for oral appliance - went back to CPAP CPAP 9/ [employer] Download- compliance 93%, AHI 3.6/ hr Body weight today-215 lbs Covid vax-3 Moderna -----OSA, mask was changed from nasal to full face mask Willing to be contacted by Pharmacy smoking cessation program. Download reviewed. Doing well. Machine due for replacement CXR 12/05/20 IMPRESSION: 1.  No radiographic evidence of acute cardiopulmonary disease. 2. Aortic atherosclerosis.  ROS-see HPI   + = positive Constitutional:    weight loss, night sweats, fevers, chills, +fatigue, lassitude. HEENT:    headaches, difficulty swallowing, tooth/dental problems, sore throat,       sneezing,  itching, ear ache, nasal congestion, post nasal drip, snoring CV:    chest pain, orthopnea, PND, swelling in lower extremities, anasarca,                                   dizziness, palpitations Resp:   shortness of breath with exertion or at rest.                productive cough,   non-productive cough, coughing up of blood.              change in color of mucus.  wheezing.   Skin:    rash or lesions. GI:  No-   heartburn, indigestion, abdominal pain, nausea, vomiting, diarrhea,                 change in bowel habits, loss of appetite GU: dysuria, change in color of urine, no urgency or frequency.   flank pain. MS:   joint pain, stiffness, decreased range of motion, back pain. Neuro-     nothing unusual Psych:  change in mood or affect.  depression or anxiety.   memory loss.  OBJ- Physical Exam General- Alert, Oriented, Affect-appropriate, Distress- none acute, + obese Skin- rash-none, lesions- none, excoriation- none Lymphadenopathy- none Head- atraumatic            Eyes- Gross vision intact, PERRLA, conjunctivae and secretions clear            Ears- Hearing, canals-normal            Nose- Clear, no-Septal dev, mucus, polyps, erosion, perforation             Throat- Mallampati III-IV , mucosa clear , drainage- none, tonsils- atrophic, + missing teeth  Neck- flexible , trachea midline, no stridor , thyroid nl, carotid no bruit Chest - symmetrical excursion , unlabored           Heart/CV- RRR , no murmur , no gallop  , no rub, nl s1 s2                           - JVD- none , edema- none, stasis changes- none, varices- none           Lung- clear to P&A, wheeze- none, cough- none , dullness-none, rub- none           Chest wall-  Abd-  Br/ Gen/ Rectal- Not done, not indicated Extrem- cyanosis- none, clubbing, none, atrophy- none, strength- nl Neuro- grossly intact to observation

## 2021-01-16 ENCOUNTER — Telehealth: Payer: Self-pay | Admitting: Internal Medicine

## 2021-01-16 ENCOUNTER — Ambulatory Visit (INDEPENDENT_AMBULATORY_CARE_PROVIDER_SITE_OTHER): Payer: Medicare HMO | Admitting: Internal Medicine

## 2021-01-16 ENCOUNTER — Other Ambulatory Visit: Payer: Self-pay

## 2021-01-16 ENCOUNTER — Encounter: Payer: Self-pay | Admitting: Internal Medicine

## 2021-01-16 VITALS — BP 118/62 | HR 70 | Temp 98.4°F | Ht 63.0 in | Wt 215.2 lb

## 2021-01-16 DIAGNOSIS — G4733 Obstructive sleep apnea (adult) (pediatric): Secondary | ICD-10-CM | POA: Diagnosis not present

## 2021-01-16 DIAGNOSIS — Z720 Tobacco use: Secondary | ICD-10-CM | POA: Diagnosis not present

## 2021-01-16 NOTE — Patient Instructions (Signed)
Order- refer to Pharmacy telephone smoking cessation program  Order- DME [employer]- please replace old CPAP machine change to auto 5-15, mask of choice, humidifier, supplies, Airview/ card  Please call if we can help

## 2021-01-16 NOTE — Telephone Encounter (Signed)
Patient requested and Dr. Annamaria Boots request a pharmacy referral for smoking cessation.  Message routed to Pharmacy Team

## 2021-01-17 NOTE — Assessment & Plan Note (Signed)
Benefits from CPAP with good compliance and control Plan- replace CPAP, change to auto 5-15

## 2021-01-17 NOTE — Assessment & Plan Note (Signed)
Agrees to talk with someone about Pharmacy Smoking Cessation program

## 2021-01-23 ENCOUNTER — Other Ambulatory Visit: Payer: Self-pay

## 2021-01-23 ENCOUNTER — Ambulatory Visit: Payer: Medicare HMO | Attending: Physician Assistant

## 2021-01-23 DIAGNOSIS — M6281 Muscle weakness (generalized): Secondary | ICD-10-CM

## 2021-01-23 DIAGNOSIS — R2681 Unsteadiness on feet: Secondary | ICD-10-CM

## 2021-01-23 DIAGNOSIS — M5442 Lumbago with sciatica, left side: Secondary | ICD-10-CM | POA: Insufficient documentation

## 2021-01-23 DIAGNOSIS — M5441 Lumbago with sciatica, right side: Secondary | ICD-10-CM | POA: Diagnosis present

## 2021-01-23 DIAGNOSIS — G8929 Other chronic pain: Secondary | ICD-10-CM | POA: Diagnosis present

## 2021-01-23 NOTE — Patient Instructions (Signed)
  [id number]

## 2021-01-23 NOTE — Therapy (Signed)
[place], [place], [address] Phone: [phone]   Fax:  [phone]  Physical Therapy Evaluation  Patient Details  Name: Melanie Reynolds MRN: [id number] Date of Birth: [birth year] Referring Provider (PT): Conrad [place], [place]   Encounter Date: 01/23/2021   PT End of Session - 01/23/21 1649     Visit Number 1    Number of Visits 9    Date for PT Re-Evaluation 03/20/21    Authorization Type [employer] MCR    Authorization Time Period FOTO v6, v10; KX Mod at [place] Note Due on Visit 10    PT Start Time 1620   pt arrived 5 minutes late to her appoinment   PT Stop Time 1700    PT Time Calculation (min) 40 min    Activity Tolerance Patient tolerated treatment well;Patient limited by pain    Behavior During Therapy [facility] for tasks assessed/performed             Past Medical History:  Diagnosis Date   Allergy    Anxiety    Arthritis    "knees, legs, back" (06/01/2013)   Asthma    Bipolar depression ([place])    Cataract    Chronic lower back pain    Constipation, chronic    Depression    High cholesterol    Irritable bowel syndrome (IBS)    OSA (obstructive sleep apnea)    Osteoporosis    Pneumonia 1970's; 1980's; 06/01/2013   "total of 3 times"   Sleep apnea    cpap   Type II diabetes mellitus ([place])     Past Surgical History:  Procedure Laterality Date   ABDOMINAL HYSTERECTOMY  1982   BACK SURGERY     CARPAL TUNNEL RELEASE     right wrist- with a nerve block placed January 2020   CHOLECYSTECTOMY  1990's   COLONOSCOPY     FOREARM FRACTURE SURGERY Left 1999   "put in plates"   IMPLANTATION VAGAL NERVE STIMULATOR  2013   POSTERIOR LUMBAR FUSION  2000's   SHOULDER OPEN ROTATOR CUFF REPAIR Right 2000   [place]  2013   "got an infection in there" (06/01/2013)    There were no vitals filed for this visit.    Subjective Assessment - 01/23/21 1625      Subjective Pt reports primary c/o chronic LBP of insidious onset lasting many years. 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 She reports having a lumbar L5-S1 spinal fusion 10 years ago. She reports worsening diabetic neuropathy involved both feet and hands R>L over the past 2 years.  She reports that she also experiences severe pins and needles/ pain in BIL LE associated with exacerbation of LBP. Her LBP is also very severe on a daily basis. She denies any saddle anesthesia, change in bowel or bladder function, unrelenting night pain, or nausea/ vomiting.    Pertinent History lumbar spinal fusion, DMII, Bipolar disorder    How long can you sit comfortably? 10-20 minutes    How long can you stand comfortably? 5 minutes    How long can you walk comfortably? 30 minutes with cane    Diagnostic tests 09/29/2018: MRI Lumbar spine without contrast: IMPRESSION:  1. Severe bilateral facet arthritis at L4-5 with inflammation as  demonstrated by enhancement around the  right facet joint at L4-5  after contrast.  2. Solid fusion at L5-S1 with no residual impingement.  3. Chronic progressive Schmorl's node in the superior endplate of  L1.    Patient Stated Goals Pt would like to be able to get a job without limitation, walking dog    Currently in Pain? Yes    Pain Score 8     Pain Location Back    Pain Orientation Lower;Right    Pain Descriptors / Indicators Sharp;Shooting    Pain Radiating Towards R lateral thigh    Pain Onset More than a month ago    Pain Frequency Constant    Aggravating Factors  Bending, prolonged sitting, standing    Pain Relieving Factors Walking, heat    Effect of Pain on Daily Activities Pt unable to work due to pain, has to walk gingerly when community ambulating                [facility] PT Assessment - 01/23/21 0001       Assessment   Medical Diagnosis lumbar spondylosis     Referring Provider (PT) Conrad Tuttle, PA-C    Onset Date/Surgical Date 01/23/01    Hand Dominance Right    Next MD Visit This Friday with pain center    Prior Therapy Yes, many times      Precautions   Precautions None      Restrictions   Weight Bearing Restrictions No      Balance Screen   Has the patient fallen in the past 6 months No    Has the patient had a decrease in activity level because of a fear of falling?  No    Is the patient reluctant to leave their home because of a fear of falling?  No      Home Environment   Living Environment Private residence    Living Arrangements Alone    Available Help at Discharge Family;Friend(s)    Type of [place] Access Level entry    [place] - single point;Walker - 2 wheels      Prior Function   Level of Independence Independent    Vocation On disability    Leisure Walking dog, music festivals, art work      Observation/Other Assessments   Observations Lumbar spinal fusion surgical incision healed well; increase lumbar lordosis in standing and sitting    Focus on Therapeutic Outcomes (FOTO)  41%, projected 49% in 13 visits      Functional Tests   Functional tests Squat;Single leg stance      Squat   Comments 10%      Single Leg Stance   Comments 2 seconds on each side      AROM   Lumbar Flexion 55 pain and use of hands on legs to return    Lumbar Extension 8 pain    Lumbar - Right Side Bend 13cm pain    Lumbar - Left Side Bend 10cm increased pain    Lumbar - Right Rotation 45 pain    Lumbar - Left Rotation 45 pain      Strength   Right Hip Flexion 4/5   LBP   Right Hip Extension 3/5    Right Hip ABduction 3/5    Left Hip Flexion 4/5    Left Hip Extension 2/5    Left Hip ABduction 3/5      Flexibility   Soft  Tissue Assessment /Muscle Length yes      Palpation   Spinal mobility Painful to all lumbar segments with CPA's, unable to assess mobility; severe pain  with palpation to sacrum      Special Tests    Special Tests Lumbar    Lumbar Tests Slump Test;Straight Leg Raise;other;other2      Slump test   Findings Negative    Comment BIL      Straight Leg Raise   Findings Negative    Comment BIL      other   Findings Positive    Side  Left    Comments ASLR      Sacral thrust    Findings Positive      Transfers   Five time sit to stand comments  17sec                        Objective measurements completed on examination: See above findings.                PT Education - 01/23/21 1649     Education Details Pt educated on probable pathophysiology behind her pain presentation, POC, and proper form with HEP    Person(s) Educated Patient    Methods Explanation;Demonstration;Handout    Comprehension Verbalized understanding;Returned demonstration              PT Short Term Goals - 01/23/21 1650       PT SHORT TERM GOAL #1   Title Pt will report understanding and adherence to her HEP in order to promote independence in the management of her primary sxs.    Baseline HEP provided at eval    Time 4    Period Weeks    Status New    Target Date 02/20/21               PT Long Term Goals - 01/23/21 1726       PT LONG TERM GOAL #1   Title Pt will achieve 70 degrees of trunk flexion with 0-3/10 pain in order to dress without limitation.    Baseline 55d with 8/10 pain    Time 8    Period Weeks    Status New    Target Date 03/20/21      PT LONG TERM GOAL #2   Title Pt will achieve FOTO score of 49% in order to demonstrate improved functional ability as it relates to her lumbar pain.    Baseline 41%    Time 8    Period Weeks    Status New    Target Date 03/20/21      PT LONG TERM GOAL #3   Title Pt will achieve BIL global hip strength of 4+/5 in order to progress her independent LE strengthening program.    Baseline see flowsheet    Time 8    Period Weeks    Status New    Target Date  03/20/21      PT LONG TERM GOAL #4   Title Pt will achieve single leg stance of 15 seconds each side in order to demonstrate improved functional balance for community ambulation.    Baseline 2 seconds BIL    Time 8    Period Weeks    Status New    Target Date 03/20/21                    Plan - 01/23/21 1716  Clinical Impression Statement Pt is a pleasant 70yo F who presents with primary c/o chronic LBP with BIL radicular sxs lasting over 20 years. Upon assessment, her primary impairments include painful and limited lumbar AROM in all planes with most pain in extension and L side bend, weak BIL global hip musculature, poor balance, painful lumbar passive accessories, and TTP to sacrum. Due to time constraints, a thorough SI Joint assessment was not completed, although this will provide clinical value at a future visit. Ruling up SI joint pain due to positive active straight leg raise test, TTP to sacrum, positive sacral thrust test, and hx of three natural births. Cannot rule out non-specific LBP due to chronicity of sxs and pain with all lumbar AROM. She will benefit from skilled PT to address her primary impairments and return to her prior level of function with less limitation due to pain.    Personal Factors and Comorbidities Comorbidity 3+    Comorbidities See medical hx    Examination-Activity Limitations Lift;Toileting;Stand;Stairs;Squat;Sleep;Sit;Bend;Bathing;Carry;Locomotion Level;Transfers    Examination-Participation Restrictions Cleaning;Occupation;Community Activity;Shop    Stability/Clinical Decision Making Evolving/Moderate complexity    Clinical Decision Making Moderate    Rehab Potential Fair    PT Frequency 1x / week    PT Duration 8 weeks    PT Treatment/Interventions ADLs/Self Care Home Management;Biofeedback;Dry needling;Passive range of motion;Taping;Patient/family education;Manual techniques;Neuromuscular re-education;Balance training;Therapeutic  exercise;Therapeutic activities;Functional mobility training;Stair training;Gait training;Moist Heat;Traction    PT Next Visit Plan Assess SIJ cluster, progress core/ hip strength    PT Home Exercise Plan [email or web address]    Consulted and Agree with Plan of Care Patient             Patient will benefit from skilled therapeutic intervention in order to improve the following deficits and impairments:  Abnormal gait, Decreased range of motion, Difficulty walking, Pain, Decreased balance, Postural dysfunction, Impaired sensation, Decreased strength, Decreased mobility, Hypomobility, Impaired flexibility  Visit Diagnosis: Chronic bilateral low back pain with bilateral sciatica  Muscle weakness (generalized)  Unsteadiness on feet     Problem List Patient Active Problem List   Diagnosis Date Noted   Acute bronchitis 01/25/2020   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   Allergic rhinitis 01/16/2017   Legionella pneumonia ([place]) 01/09/2016   Sepsis ([place]) 01/04/2016   AKI (acute kidney injury) ([place]) 01/04/2016   Exertional dyspnea 08/29/2015   Tobacco user 08/29/2015   Obesity 08/29/2015   OSA (obstructive sleep apnea) 08/13/2013   CAP (community acquired pneumonia) 06/01/2013   Chest pain, musculoskeletal 06/01/2013   Bipolar 1 disorder ([place]) 06/01/2013   Hypotension 06/01/2013   Chronic pain 06/01/2013   Diabetes [facility]) 06/01/2013     [place] [facility] [address] [place], [place], [address] Phone: [phone]   Fax:  [phone]  Name: Melanie Reynolds MRN: [id number] Date of Birth: 22-Mar-1951  Referring diagnosis? Lumbar Spondylosis Treatment diagnosis? (if different than referring diagnosis) Chronic bilateral low back pain with bilateral sciatica (M54.42) What was this (referring dx) caused by? []  Surgery []  Fall [x]  Ongoing issue []  Arthritis []  Other: ____________  Laterality: []  Rt []  Lt [x]  Both  Check all possible CPT  codes:      [x]  97110 (Therapeutic Exercise)  []  92507 (SLP Treatment)  [x]  97112 (Neuro Re-ed)   []  92526 (Swallowing Treatment)   [x]  97116 (Gait Training)   []  [id number] (Cognitive Training, 1st 15 minutes) [x]  97140 (Manual Therapy)   []  97130 (Cognitive Training, each add'l 15 minutes)  [x]  97530 (Therapeutic Activities)  []  Other,  List CPT Code ____________    [x]  25956 (Self Care)       []  All codes above (97110 - 97535)  [x]  97012 (Mechanical Traction)  []  97014 (E-stim Unattended)  []  97032 (E-stim manual)  []  97033 (Ionto)  []  97035 (Ultrasound)  []  97760 (Orthotic Fit) [x]  [id number] (Physical Performance Training) []  [id number] (Aquatic Therapy) []  97034 (Contrast Bath) []  97018 (Paraffin) []  97597 (Wound Care 1st 20 sq cm) []  97598 (Wound Care each add'l 20 sq cm) []  97016 (Vasopneumatic Device) []  [phone] Comptroller) []  [id number] (Prosthetic Training)  Vanessa Mayo, PT, DPT 01/23/21 5:32 PM

## 2021-02-02 ENCOUNTER — Ambulatory Visit: Payer: Medicare HMO

## 2021-02-09 ENCOUNTER — Other Ambulatory Visit: Payer: Self-pay

## 2021-02-09 ENCOUNTER — Ambulatory Visit: Payer: Medicare HMO | Attending: Physician Assistant

## 2021-02-09 VITALS — BP 99/67 | HR 73

## 2021-02-09 DIAGNOSIS — M5441 Lumbago with sciatica, right side: Secondary | ICD-10-CM | POA: Insufficient documentation

## 2021-02-09 DIAGNOSIS — R2681 Unsteadiness on feet: Secondary | ICD-10-CM | POA: Diagnosis present

## 2021-02-09 DIAGNOSIS — G8929 Other chronic pain: Secondary | ICD-10-CM | POA: Insufficient documentation

## 2021-02-09 DIAGNOSIS — R0789 Other chest pain: Secondary | ICD-10-CM | POA: Diagnosis not present

## 2021-02-09 DIAGNOSIS — M5442 Lumbago with sciatica, left side: Secondary | ICD-10-CM | POA: Insufficient documentation

## 2021-02-09 DIAGNOSIS — M6281 Muscle weakness (generalized): Secondary | ICD-10-CM | POA: Diagnosis present

## 2021-02-09 NOTE — Therapy (Signed)
[place], [place], [address] Phone: [phone]   Fax:  [phone]  Physical Therapy Treatment  Patient Details  Name: Steffie Waggoner MRN: [id number] Date of Birth: Feb 02, 1951 Referring Provider (PT): Conrad Comfrey, [place]   Encounter Date: 02/09/2021   PT End of Session - 02/09/21 1255     Visit Number 2    Number of Visits 9    Date for PT Re-Evaluation 03/20/21    Authorization Type [employer] MCR    Authorization Time Period FOTO v6, v10; KX Mod at [place] Note Due on Visit 10    PT Start Time 1215    PT Stop Time 1300    PT Time Calculation (min) 45 min    Activity Tolerance Patient tolerated treatment well;Patient limited by pain    Behavior During Therapy [facility] for tasks assessed/performed             Past Medical History:  Diagnosis Date   Allergy    Anxiety    Arthritis    "knees, legs, back" (06/01/2013)   Asthma    Bipolar depression ([place])    Cataract    Chronic lower back pain    Constipation, chronic    Depression    High cholesterol    Irritable bowel syndrome (IBS)    OSA (obstructive sleep apnea)    Osteoporosis    Pneumonia 1970's; 1980's; 06/01/2013   "total of 3 times"   Sleep apnea    cpap   Type II diabetes mellitus ([place])     Past Surgical History:  Procedure Laterality Date   ABDOMINAL HYSTERECTOMY  1982   BACK SURGERY     CARPAL TUNNEL RELEASE     right wrist- with a nerve block placed January 2020   CHOLECYSTECTOMY  1990's   COLONOSCOPY     FOREARM FRACTURE SURGERY Left 1999   "put in plates"   IMPLANTATION VAGAL NERVE STIMULATOR  2013   POSTERIOR LUMBAR FUSION  2000's   SHOULDER OPEN ROTATOR CUFF REPAIR Right 2000   [place]  2013   "got an infection in there" (06/01/2013)    Vitals:   02/09/21 1258  BP: 99/67  Pulse: 73  SpO2: 96%     Subjective Assessment - 02/09/21 1218     Subjective Pt  reports feeling the same since starting PT. She also reports that she has not done any of her HEP, although she reports walking.    Currently in Pain? Yes    Pain Score 7     Pain Location Back    Pain Orientation Lower;Right    Pain Descriptors / Indicators Shooting;Sharp    Pain Type Chronic pain    Pain Radiating Towards R thigh    Pain Onset More than a month ago    Pain Frequency Constant                [facility] PT Assessment - 02/09/21 0001       other   Comment SIJ directional preference identified in R innominate nutation      Pelvic Dictraction   Findings Positive    Side  Right      Pelvic Compression   Findings Positive    Side Right      Gaenslen's test   Findings Positive    Side  Right      Sacral Compression   Findings Positive  Side  Right                           [facility] Adult PT Treatment/Exercise - 02/09/21 0001       Lumbar Exercises: Standing   Forward Lunge Limitations SIJ directional preference rocking with R foot on table 2x15    Other Standing Lumbar Exercises Pallof press with 3# cable 2x10 BIL    Other Standing Lumbar Exercises Trunk side bend with 3# 2x10 BIL      Lumbar Exercises: Seated   Other Seated Lumbar Exercises Sciatic nerve glides on R 2x10      Lumbar Exercises: Supine   Large Ball Abdominal Isometric Limitations Pt unable to complete due to LBP    Other Supine Lumbar Exercises Alternating eccentric leg lowering 2x10 BIL                       PT Short Term Goals - 01/23/21 1650       PT SHORT TERM GOAL #1   Title Pt will report understanding and adherence to her HEP in order to promote independence in the management of her primary sxs.    Baseline HEP provided at eval    Time 4    Period Weeks    Status New    Target Date 02/20/21               PT Long Term Goals - 01/23/21 1726       PT LONG TERM GOAL #1   Title Pt will achieve 70 degrees of trunk flexion with 0-3/10  pain in order to dress without limitation.    Baseline 55d with 8/10 pain    Time 8    Period Weeks    Status New    Target Date 03/20/21      PT LONG TERM GOAL #2   Title Pt will achieve FOTO score of 49% in order to demonstrate improved functional ability as it relates to her lumbar pain.    Baseline 41%    Time 8    Period Weeks    Status New    Target Date 03/20/21      PT LONG TERM GOAL #3   Title Pt will achieve BIL global hip strength of 4+/5 in order to progress her independent LE strengthening program.    Baseline see flowsheet    Time 8    Period Weeks    Status New    Target Date 03/20/21      PT LONG TERM GOAL #4   Title Pt will achieve single leg stance of 15 seconds each side in order to demonstrate improved functional balance for community ambulation.    Baseline 2 seconds BIL    Time 8    Period Weeks    Status New    Target Date 03/20/21                   Plan - 02/09/21 1256     Clinical Impression Statement Pt responded well to all interventions today, although she was unable to complete isometric abdominal ball squeeze due to LBP. Upon further assessment of SIJ, findings suggest likely SIJ involvement in the presentation of the pt's pain. SIJ directional preference was established in R sided nutation. Additionally, she reports dizziness toward the end of the session today during Pallof press. The exercise was terminated and the pt was encouraged to take a seated  rest and given a cup of water. Her vitals were taken, which fell in WNL range. Her symptoms subsided after 1 minute of seated rest and she leaves clinic with decreased pain. She will continue to benefit from skilled PT to address her primary impairments and return to her prior level of function with less limitation.    Personal Factors and Comorbidities Comorbidity 3+    Comorbidities See medical hx    Examination-Activity Limitations  Lift;Toileting;Stand;Stairs;Squat;Sleep;Sit;Bend;Bathing;Carry;Locomotion Level;Transfers    Examination-Participation Restrictions Cleaning;Occupation;Community Activity;Shop    Stability/Clinical Decision Making Evolving/Moderate complexity    Clinical Decision Making Moderate    Rehab Potential Fair    PT Frequency 1x / week    PT Duration 8 weeks    PT Treatment/Interventions ADLs/Self Care Home Management;Biofeedback;Dry needling;Passive range of motion;Taping;Patient/family education;Manual techniques;Neuromuscular re-education;Balance training;Therapeutic exercise;Therapeutic activities;Functional mobility training;Stair training;Gait training;Moist Heat;Traction    PT Next Visit Plan progress core/ hip strength, work into R sided SIJ nutation directional preference    PT Home Exercise Plan [email or web address]    Consulted and Agree with Plan of Care Patient             Patient will benefit from skilled therapeutic intervention in order to improve the following deficits and impairments:  Abnormal gait, Decreased range of motion, Difficulty walking, Pain, Decreased balance, Postural dysfunction, Impaired sensation, Decreased strength, Decreased mobility, Hypomobility, Impaired flexibility  Visit Diagnosis: Chronic bilateral low back pain with bilateral sciatica  Muscle weakness (generalized)  Unsteadiness on feet     Problem List Patient Active Problem List   Diagnosis Date Noted   Acute bronchitis 01/25/2020   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 [place], [place], [address] Phone: [phone]   Fax:  [phone]  Name: Alvetta Hidrogo MRN: [id number] Date of Birth: November 29, 1950

## 2021-02-16 ENCOUNTER — Other Ambulatory Visit: Payer: Self-pay

## 2021-02-16 ENCOUNTER — Ambulatory Visit: Payer: Medicare HMO

## 2021-02-16 DIAGNOSIS — R2681 Unsteadiness on feet: Secondary | ICD-10-CM

## 2021-02-16 DIAGNOSIS — M5442 Lumbago with sciatica, left side: Secondary | ICD-10-CM

## 2021-02-16 DIAGNOSIS — G8929 Other chronic pain: Secondary | ICD-10-CM

## 2021-02-16 DIAGNOSIS — M6281 Muscle weakness (generalized): Secondary | ICD-10-CM

## 2021-02-16 NOTE — Therapy (Signed)
[place], [place], [address] Phone: [phone]   Fax:  [phone]  Physical Therapy Treatment  Patient Details  Name: Melanie Reynolds MRN: [id number] Date of Birth: January 13, 1951 Referring Provider (PT): Conrad [place], [place]   Encounter Date: 02/16/2021   PT End of Session - 02/16/21 1229     Visit Number 3    Number of Visits 9    Date for PT Re-Evaluation 03/20/21    Authorization Type [employer] MCR    Authorization Time Period FOTO v6, v10; KX Mod at [place] Note Due on Visit 10    PT Start Time 1220    PT Stop Time 1308   10 minutes moist heat   PT Time Calculation (min) 48 min    Activity Tolerance Patient tolerated treatment well;Patient limited by pain    Behavior During Therapy [facility] for tasks assessed/performed             Past Medical History:  Diagnosis Date   Allergy    Anxiety    Arthritis    "knees, legs, back" (06/01/2013)   Asthma    Bipolar depression ([place])    Cataract    Chronic lower back pain    Constipation, chronic    Depression    High cholesterol    Irritable bowel syndrome (IBS)    OSA (obstructive sleep apnea)    Osteoporosis    Pneumonia 1970's; 1980's; 06/01/2013   "total of 3 times"   Sleep apnea    cpap   Type II diabetes mellitus ([place])     Past Surgical History:  Procedure Laterality Date   ABDOMINAL HYSTERECTOMY  1982   BACK SURGERY     CARPAL TUNNEL RELEASE     right wrist- with a nerve block placed January 2020   CHOLECYSTECTOMY  1990's   COLONOSCOPY     FOREARM FRACTURE SURGERY Left 1999   "put in plates"   IMPLANTATION VAGAL NERVE STIMULATOR  2013   POSTERIOR LUMBAR FUSION  2000's   SHOULDER OPEN ROTATOR CUFF REPAIR Right 2000   [place]  2013   "got an infection in there" (06/01/2013)    There were no vitals filed for this visit.   Subjective Assessment - 02/16/21 1222     Subjective Pt  reports moderate LBP today, adding that her SIJ directional preference exercise has been more painful for her at home than in the clinic at the last session.    Currently in Pain? Yes    Pain Score 6     Pain Location Back    Pain Orientation Lower    Pain Descriptors / Indicators Sharp;Shooting    Pain Type Chronic pain                               [facility] Adult PT Treatment/Exercise - 02/16/21 0001       Lumbar Exercises: Stretches   Lower Trunk Rotation Limitations x10 with 3-second hold BIL    Piriformis Stretch Limitations 2x58min BIL      Lumbar Exercises: Standing   Other Standing Lumbar Exercises Abdominal pressdown with physioball at edge of table 3x30sec      Lumbar Exercises: Supine   Other Supine Lumbar Exercises Alternating eccentric bent-knee leg lowering off edge of table 2x10 BIL      Knee/Hip Exercises: Machines for Strengthening  Hip Cybex Hip extension and abduction 2x8 BIL each with 12.5#      Modalities   Modalities Moist Heat      Moist Heat Therapy   Number Minutes Moist Heat 10 Minutes                       PT Short Term Goals - 01/23/21 1650       PT SHORT TERM GOAL #1   Title Pt will report understanding and adherence to her HEP in order to promote independence in the management of her primary sxs.    Baseline HEP provided at eval    Time 4    Period Weeks    Status New    Target Date 02/20/21               PT Long Term Goals - 01/23/21 1726       PT LONG TERM GOAL #1   Title Pt will achieve 70 degrees of trunk flexion with 0-3/10 pain in order to dress without limitation.    Baseline 55d with 8/10 pain    Time 8    Period Weeks    Status New    Target Date 03/20/21      PT LONG TERM GOAL #2   Title Pt will achieve FOTO score of 49% in order to demonstrate improved functional ability as it relates to her lumbar pain.    Baseline 41%    Time 8    Period Weeks    Status New    Target Date  03/20/21      PT LONG TERM GOAL #3   Title Pt will achieve BIL global hip strength of 4+/5 in order to progress her independent LE strengthening program.    Baseline see flowsheet    Time 8    Period Weeks    Status New    Target Date 03/20/21      PT LONG TERM GOAL #4   Title Pt will achieve single leg stance of 15 seconds each side in order to demonstrate improved functional balance for community ambulation.    Baseline 2 seconds BIL    Time 8    Period Weeks    Status New    Target Date 03/20/21                   Plan - 02/16/21 1229     Clinical Impression Statement Pt responded well to all interventions today, demonstrating good form with selected exercises. She reports mild increase in LBP with hip cybex strengthening. She reports a therapeutic response to lumbar/ piriformis stretching, as well as moist heat therapy, reporting decrease in pain from 6/10 to 5/10 following the intervention. She will continue to benefit from skilled PT to address her primary impairments and return to her prior level of function with less limitation.    Personal Factors and Comorbidities Comorbidity 3+    Comorbidities See medical hx    Examination-Activity Limitations Lift;Toileting;Stand;Stairs;Squat;Sleep;Sit;Bend;Bathing;Carry;Locomotion Level;Transfers    Examination-Participation Restrictions Cleaning;Occupation;Community Activity;Shop    Stability/Clinical Decision Making Evolving/Moderate complexity    Clinical Decision Making Moderate    Rehab Potential Fair    PT Frequency 1x / week    PT Duration 8 weeks    PT Treatment/Interventions ADLs/Self Care Home Management;Biofeedback;Dry needling;Passive range of motion;Taping;Patient/family education;Manual techniques;Neuromuscular re-education;Balance training;Therapeutic exercise;Therapeutic activities;Functional mobility training;Stair training;Gait training;Moist Heat;Traction    PT Next Visit Plan progress core/ hip strength/  stretching  PT Home Exercise Plan [email or web address]    Consulted and Agree with Plan of Care Patient             Patient will benefit from skilled therapeutic intervention in order to improve the following deficits and impairments:  Abnormal gait, Decreased range of motion, Difficulty walking, Pain, Decreased balance, Postural dysfunction, Impaired sensation, Decreased strength, Decreased mobility, Hypomobility, Impaired flexibility  Visit Diagnosis: Chronic bilateral low back pain with bilateral sciatica  Muscle weakness (generalized)  Unsteadiness on feet     Problem List Patient Active Problem List   Diagnosis Date Noted   Acute bronchitis 01/25/2020   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 [place], [place], [address] Phone: [phone]   Fax:  [phone]  Name: Catina Nuss MRN: [id number] Date of Birth: November 08, 1950

## 2021-02-23 ENCOUNTER — Ambulatory Visit: Payer: Medicare HMO

## 2021-02-23 ENCOUNTER — Other Ambulatory Visit: Payer: Self-pay

## 2021-02-23 ENCOUNTER — Encounter (HOSPITAL_COMMUNITY): Payer: Self-pay | Admitting: Emergency Medicine

## 2021-02-23 ENCOUNTER — Emergency Department (HOSPITAL_COMMUNITY)
Admission: EM | Admit: 2021-02-23 | Discharge: 2021-02-23 | Disposition: A | Discharge status: Home / Self Care | Payer: Medicare HMO | Source: Home / Self Care

## 2021-02-23 ENCOUNTER — Emergency Department (HOSPITAL_COMMUNITY): Payer: Medicare HMO

## 2021-02-23 VITALS — BP 93/56 | HR 97

## 2021-02-23 DIAGNOSIS — R2681 Unsteadiness on feet: Secondary | ICD-10-CM

## 2021-02-23 DIAGNOSIS — Z5321 Procedure and treatment not carried out due to patient leaving prior to being seen by health care provider: Secondary | ICD-10-CM | POA: Insufficient documentation

## 2021-02-23 DIAGNOSIS — R079 Chest pain, unspecified: Secondary | ICD-10-CM | POA: Insufficient documentation

## 2021-02-23 DIAGNOSIS — M79602 Pain in left arm: Secondary | ICD-10-CM | POA: Insufficient documentation

## 2021-02-23 DIAGNOSIS — M6281 Muscle weakness (generalized): Secondary | ICD-10-CM

## 2021-02-23 DIAGNOSIS — G8929 Other chronic pain: Secondary | ICD-10-CM

## 2021-02-23 DIAGNOSIS — M5442 Lumbago with sciatica, left side: Secondary | ICD-10-CM

## 2021-02-23 LAB — CBC WITH DIFFERENTIAL/PLATELET
Abs Immature Granulocytes: 0.03 10*3/uL (ref 0.00–0.07)
Basophils Absolute: 0.1 10*3/uL (ref 0.0–0.1)
Basophils Relative: 1 %
Eosinophils Absolute: 0.1 10*3/uL (ref 0.0–0.5)
Eosinophils Relative: 1 %
HCT: 45.3 % (ref 36.0–46.0)
Hemoglobin: 14.3 g/dL (ref 12.0–15.0)
Immature Granulocytes: 0 %
Lymphocytes Relative: 46 %
Lymphs Abs: 3.9 10*3/uL (ref 0.7–4.0)
MCH: 29.2 pg (ref 26.0–34.0)
MCHC: 31.6 g/dL (ref 30.0–36.0)
MCV: 92.6 fL (ref 80.0–100.0)
Monocytes Absolute: 0.7 10*3/uL (ref 0.1–1.0)
Monocytes Relative: 8 %
Neutro Abs: 3.7 10*3/uL (ref 1.7–7.7)
Neutrophils Relative %: 44 %
Platelets: 298 10*3/uL (ref 150–400)
RBC: 4.89 MIL/uL (ref 3.87–5.11)
RDW: 14.9 % (ref 11.5–15.5)
WBC: 8.4 10*3/uL (ref 4.0–10.5)
nRBC: 0 % (ref 0.0–0.2)

## 2021-02-23 LAB — COMPREHENSIVE METABOLIC PANEL
ALT: 23 U/L (ref 0–44)
AST: 22 U/L (ref 15–41)
Albumin: 3.6 g/dL (ref 3.5–5.0)
Alkaline Phosphatase: 66 U/L (ref 38–126)
Anion gap: 7 (ref 5–15)
BUN: 10 mg/dL (ref 8–23)
CO2: 24 mmol/L (ref 22–32)
Calcium: 9.3 mg/dL (ref 8.9–10.3)
Chloride: 108 mmol/L (ref 98–111)
Creatinine, Ser: 0.95 mg/dL (ref 0.44–1.00)
GFR, Estimated: >60 mL/min (ref >60)
Glucose, Bld: 93 mg/dL (ref 70–99)
Potassium: 4.3 mmol/L (ref 3.5–5.1)
Sodium: 139 mmol/L (ref 135–145)
Total Bilirubin: 0.9 mg/dL (ref 0.3–1.2)
Total Protein: 6.9 g/dL (ref 6.5–8.1)

## 2021-02-23 LAB — PROTIME-INR
INR: 1 (ref 0.8–1.2)
Prothrombin Time: 13.4 seconds (ref 11.4–15.2)

## 2021-02-23 LAB — LIPASE, BLOOD: Lipase: 54 U/L — ABNORMAL HIGH (ref 11–51)

## 2021-02-23 LAB — TROPONIN I (HIGH SENSITIVITY): Troponin I (High Sensitivity): 4 ng/L (ref <18)

## 2021-02-23 LAB — LITHIUM LEVEL: Lithium Lvl: 0.24 mmol/L — ABNORMAL LOW (ref 0.60–1.20)

## 2021-02-23 NOTE — Therapy (Addendum)
[place], [place], [address] Phone: [phone]   Fax:  [phone]  Physical Therapy Treatment/Discharge  Patient Details  Name: Melanie Reynolds MRN: [id number] Date of Birth: 08-05-1950 Referring Provider (PT): Conrad [place], [place]   Encounter Date: 02/23/2021   PT End of Session - 02/23/21 1237     Visit Number 3    Number of Visits 9    Date for PT Re-Evaluation 03/20/21    Authorization Type [employer] MCR    Authorization Time Period FOTO v6, v10; KX Mod at [place] Note Due on Visit 10    PT Start Time 1225    PT Stop Time 5501    PT Time Calculation (min) 10 min             Past Medical History:  Diagnosis Date   Allergy    Anxiety    Arthritis    "knees, legs, back" (06/01/2013)   Asthma    Bipolar depression ([place])    Cataract    Chronic lower back pain    Constipation, chronic    Depression    High cholesterol    Irritable bowel syndrome (IBS)    OSA (obstructive sleep apnea)    Osteoporosis    Pneumonia 1970's; 1980's; 06/01/2013   "total of 3 times"   Sleep apnea    cpap   Type II diabetes mellitus ([place])     Past Surgical History:  Procedure Laterality Date   ABDOMINAL HYSTERECTOMY  1982   BACK SURGERY     CARPAL TUNNEL RELEASE     right wrist- with a nerve block placed January 2020   CHOLECYSTECTOMY  1990's   COLONOSCOPY     FOREARM FRACTURE SURGERY Left 1999   "put in plates"   IMPLANTATION VAGAL NERVE STIMULATOR  2013   POSTERIOR LUMBAR FUSION  2000's   SHOULDER OPEN ROTATOR CUFF REPAIR Right 2000   [place]  2013   "got an infection in there" (06/01/2013)    Vitals:   02/23/21 1236  BP: (!) 93/56  Pulse: 97     Subjective Assessment - 02/23/21 1223     Subjective Pt reports increased lumbar pain today. She also reports recent onset of resting chest tightness and SOB upon physical exertion since this morning.  She states that it has been unrelenting throughout the day. She also reports L arm pain and weakness the past few days. Due to concern for MI, the session was terminated and the pt advised to go to the ED. She agrees to this plan and reports that she will go to the ED upon exiting clinic today.                                          PT Short Term Goals - 01/23/21 1650       PT SHORT TERM GOAL #1   Title Pt will report understanding and adherence to her HEP in order to promote independence in the management of her primary sxs.    Baseline HEP provided at eval    Time 4    Period Weeks    Status New    Target Date 02/20/21               PT Long Term Goals - 01/23/21  [place] #1   Title Pt will achieve 70 degrees of trunk flexion with 0-3/10 pain in order to dress without limitation.    Baseline 55d with 8/10 pain    Time 8    Period Weeks    Status New    Target Date 03/20/21      PT LONG TERM GOAL #2   Title Pt will achieve FOTO score of 49% in order to demonstrate improved functional ability as it relates to her lumbar pain.    Baseline 41%    Time 8    Period Weeks    Status New    Target Date 03/20/21      PT LONG TERM GOAL #3   Title Pt will achieve BIL global hip strength of 4+/5 in order to progress her independent LE strengthening program.    Baseline see flowsheet    Time 8    Period Weeks    Status New    Target Date 03/20/21      PT LONG TERM GOAL #4   Title Pt will achieve single leg stance of 15 seconds each side in order to demonstrate improved functional balance for community ambulation.    Baseline 2 seconds BIL    Time 8    Period Weeks    Status New    Target Date 03/20/21                   Plan - 02/23/21 1238     Clinical Impression Statement Due to cardiac red flags including acute left sided chest tightness, SOB, and L arm pain, the session was terminated and the pt was  advised to travel to the ED following today's session. She agrees to this plan.    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   Allergic rhinitis 01/16/2017   Legionella pneumonia ([place]) 01/09/2016   Sepsis ([place]) 01/04/2016   AKI (acute kidney injury) ([place]) 01/04/2016   Exertional dyspnea 08/29/2015   Tobacco user 08/29/2015   Obesity 08/29/2015   OSA (obstructive sleep apnea) 08/13/2013   CAP (community acquired pneumonia) 06/01/2013   Chest pain, musculoskeletal 06/01/2013   Bipolar 1 disorder ([place]) 06/01/2013   Hypotension 06/01/2013   Chronic pain 06/01/2013   Diabetes ([place]) 06/01/2013    Melanie Reynolds, PT, DPT 02/23/21 12:40 PM  PHYSICAL THERAPY DISCHARGE SUMMARY  Visits from Start of Care: 3  Current functional level related to goals / functional outcomes: No formal re-assessment of goals.    Remaining deficits: Status unknown.    Education / Equipment: N/a   Patient agrees to discharge. Patient goals were not met. Patient is being discharged due to not returning since the last visit.  Melanie Reynolds, PT, DPT, ATC 04/04/21 5:24 PM  [place] [facility] [address] [place], [place], [address] Phone: [phone]   Fax:  [phone]  Name: Melanie Reynolds MRN: [id number] Date of Birth: [birth year]

## 2021-02-23 NOTE — ED Provider Notes (Signed)
Emergency Medicine Provider Triage Evaluation Note  Melanie Reynolds , a 70 y.o. female  was evaluated in triage.  Pt complains of Chest tightness and left arm pain yesterday.  That lasted a few minutes and was at rest.    Today she took her dog out and felt exhausted. She having about 1-2 second episodes of sharp pain.  She is unable to give me a round frequency for how often they are happening, notes they got better after the hot shower.  She doesn't report cough or shortness of breath.  The last time she had any abnormal chest sensation was a few minutes ago.  In addition to the sharp pain she has a dull pulling sensation.    Review of Systems  Positive: Chest pain, left arm pain.  Negative: Fevers   Physical Exam  BP 102/64 (BP Location: Left Arm)   Pulse 61   Temp 98.6 F (37 C) (Oral)   Resp 17   SpO2 96%  Gen:   Awake, no distress   Resp:  Normal effort  MSK:   Moves extremities without difficulty  Other:  Normal speech.  Able to pick up objects with hands bilaterally   Medical Decision Making  Medically screening exam initiated at 1:47 PM.  Appropriate orders placed.  Sharra Cayabyab was informed that the remainder of the evaluation will be completed by another provider, this initial triage assessment does not replace that evaluation, and the importance of remaining in the ED until their evaluation is complete.  Note: Portions of this report may have been transcribed using voice recognition software. Every effort was made to ensure accuracy; however, inadvertent computerized transcription errors may be present    Ollen Gross 02/23/21 2225    Regan Lemming, MD 02/24/21 [phone]

## 2021-02-23 NOTE — ED Notes (Signed)
PT wristband given to this tech from registration, stating that pt has left.

## 2021-02-23 NOTE — ED Triage Notes (Signed)
Patient here for evaluation of intermittent chest pain that started yesterday while at rest watching TV, started as heaviness in the left arm and evolved into pain the left side of her chest. Pain recurred today. Patient alert, oriented, and in no apparent distress at this time.

## 2021-02-23 NOTE — Patient Instructions (Signed)
Pt instructed to go immediately to the ED following exit from clinic today due to concerns of MI. Pt agrees to this plan.

## 2021-02-23 NOTE — ED Notes (Signed)
PT called for vitals, NA x1.

## 2021-02-28 ENCOUNTER — Telehealth: Payer: Self-pay

## 2021-02-28 DIAGNOSIS — Z720 Tobacco use: Secondary | ICD-10-CM

## 2021-02-28 MED ORDER — NICOTINE 21 MG/24HR TD PT24: 21.0000 mg | MEDICATED_PATCH | Freq: Every day | TRANSDERMAL | 2 refills | Status: DC

## 2021-02-28 NOTE — Telephone Encounter (Signed)
Melanie Reynolds is a 70 y.o. female and has been referred to the pharmacist telephone-based smoking cessation service by pulmonologist Dr. Annamaria Boots.   -------------------------------------------------------------------------------------------------------------------  Confirmed that patient does not meet any of the following exclusion criteria: No  Pregnancy Myocardial infarction or coronary artery bypass grafting in the last 2 months Severe or worsening angina  Currently smoking > 10 cigarettes per day? Yes  Willing to quit smoking now or within 30 days? Yes  Interested in using medications to help you quit smoking? Yes   If yes to all above, patient meets criteria for telephone-based service.  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 Yes  Most recent quit attempt: 6 years Longest time ever been tobacco free: 7, starting smoking again when mother died  What helped? Will power What was difficult? Loniless and   Tobacco Use Habits: Triggers include habbit. Does not wake at night to smoke Alcohol use: socially Other smokers in household or daily life: live alone  Identify social support: daughter, [name]  On a scale of 1-10, how CONFIDENT are you that you will successfully quit: 9 Barriers/concerns:  'life'  On a scale of 1-10, how IMPORTANT is it to you that you quit: 10 Motivators: cost, health  Past pharmacotherapy trials: (list what agents and why not effective):  '[x]'  Nicotine gum, doesn't like it '[]'  Nicotine lozenge '[x]'  Nicotine patch, feels like raising BP '[]'  Nicotine inhaler '[]'  Nicotine nasal spray '[]'  Bupropion (Zyban) '[]'  Varenicline (Chantix)  Current Outpatient  Medications  Medication Instructions   ACCU-CHEK AVIVA PLUS test strip Daily, for testing   ACCU-CHEK SOFTCLIX LANCETS lancets Daily, for testing   albuterol (VENTOLIN HFA) 108 (90 Base) MCG/ACT inhaler 2 puffs, Inhalation, Every 6 hours PRN   clobetasol ointment (TEMOVATE) 5.40 % 1 application, Topical, As needed   diclofenac sodium (VOLTAREN) 4 g, Topical, 4 times daily PRN   fluticasone (FLONASE) 50 MCG/ACT nasal spray 2 sprays, Each Nare, Daily   furosemide (LASIX) 20 MG tablet 1 tablet, Oral, Daily   lithium carbonate 300 mg, Oral, Daily   lubiprostone (AMITIZA) 24 MCG capsule Take 1 capsule (24 mcg total) by mouth 2 (two) times daily with a meal.   Lyrica 150 mg, Oral, 3 times daily   meclizine (ANTIVERT) 12.5 MG tablet 1 tablet, Oral, 3 times daily PRN   Misc Natural Products (T-RELIEF CBD+13 SL) 1 vial, Sublingual, 2 times daily PRN   NARCAN 4 MG/0.1ML LIQD nasal spray kit No dose, route, or frequency recorded.   ondansetron (ZOFRAN-ODT) 4 mg, Oral, As needed   oxyCODONE-acetaminophen (PERCOCET) 7.5-325 MG tablet 1 tablet, Oral, Every 6 hours PRN   potassium chloride SA (K-DUR,KLOR-CON) 20 MEQ tablet 20 mEq, Daily   simvastatin (ZOCOR) 40 MG tablet 1 tablet, Oral, Daily at bedtime   tiZANidine (ZANAFLEX) 4 mg, Oral, 2 times daily PRN   topiramate (TOPAMAX) 50 mg, Oral, Daily   Victoza 0.6 mg, Subcutaneous, Daily    Assessment/Plan: Patient states interest in quitting smoking. When I had first called her a few weeks ago, she said she was not interested in quitting yet. Today she states she is ready. Set quit date of 04/22/21.   Discussed options for smoking cessation agents and patient is agreeable to: '[x]'  Nicotine patch 21 mg  Treatment was reviewed with the  patient including name, instructions, goals of therapy, potential adverse effects including mild itching or redness at the point of application, headache, trouble sleeping, and/or vivid dreams.   Medication  counseling:  The following counseling was provided: '[x]'  Anticipated nicotine withdrawal symptoms '[x]'  Coping skills/strategies '[x]'  Information on 1-800-QUITNOW support program '[x]'  Tell family and friends about quitting '[x]'  Stress management '[x]'  Remove tobacco products (cigarettes, lighters, ash trays) by 04/22/21  Patient was advised to contact Pulmonary Clinic if questions/concerns arise. Patient verbalized understanding of information.  Follow up in 2 week(s)  Time spent: 15 minutes Pauletta Browns, Pharm.D. PGY-1 Pharmacy Resident [phone] 02/28/2021 2:27 PM

## 2021-03-08 ENCOUNTER — Ambulatory Visit
Admission: RE | Admit: 2021-03-08 | Discharge: 2021-03-08 | Disposition: A | Discharge status: Home / Self Care | Payer: Medicare HMO | Source: Ambulatory Visit | Attending: Family Medicine | Admitting: Family Medicine

## 2021-03-08 ENCOUNTER — Other Ambulatory Visit: Payer: Self-pay | Admitting: Family Medicine

## 2021-03-08 ENCOUNTER — Other Ambulatory Visit: Payer: Self-pay

## 2021-03-08 DIAGNOSIS — M25512 Pain in left shoulder: Secondary | ICD-10-CM

## 2021-03-08 DIAGNOSIS — M5412 Radiculopathy, cervical region: Secondary | ICD-10-CM

## 2021-03-09 ENCOUNTER — Other Ambulatory Visit: Payer: Self-pay | Admitting: Family Medicine

## 2021-03-09 DIAGNOSIS — Z122 Encounter for screening for malignant neoplasm of respiratory organs: Secondary | ICD-10-CM

## 2021-03-09 DIAGNOSIS — F17200 Nicotine dependence, unspecified, uncomplicated: Secondary | ICD-10-CM

## 2021-03-09 DIAGNOSIS — Z87891 Personal history of nicotine dependence: Secondary | ICD-10-CM

## 2021-03-14 ENCOUNTER — Other Ambulatory Visit: Payer: Self-pay | Admitting: Family Medicine

## 2021-03-14 DIAGNOSIS — M5412 Radiculopathy, cervical region: Secondary | ICD-10-CM

## 2021-03-14 DIAGNOSIS — M958 Other specified acquired deformities of musculoskeletal system: Secondary | ICD-10-CM

## 2021-03-14 DIAGNOSIS — M25512 Pain in left shoulder: Secondary | ICD-10-CM

## 2021-03-30 ENCOUNTER — Ambulatory Visit
Admission: RE | Admit: 2021-03-30 | Discharge: 2021-03-30 | Disposition: A | Discharge status: Home / Self Care | Payer: Medicare HMO | Source: Ambulatory Visit | Attending: Family Medicine | Admitting: Family Medicine

## 2021-03-30 DIAGNOSIS — F17200 Nicotine dependence, unspecified, uncomplicated: Secondary | ICD-10-CM

## 2021-03-30 DIAGNOSIS — Z87891 Personal history of nicotine dependence: Secondary | ICD-10-CM

## 2021-03-30 DIAGNOSIS — Z122 Encounter for screening for malignant neoplasm of respiratory organs: Secondary | ICD-10-CM

## 2021-04-06 ENCOUNTER — Telehealth: Payer: Self-pay

## 2021-04-06 NOTE — Telephone Encounter (Signed)
   Melanie Reynolds is a 70 y.o. female and was referred to the pharmacist telephone-based smoking cessation service by pulmonologist Dr. Annamaria Boots.  At last telephone visit on 02/28/2021.  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 Yes  Most recent quit attempt: 6 years Longest time ever been tobacco free: 7, starting smoking again when mother died  What helped? Will power What was difficult? Loniless and    Tobacco Use Habits: Triggers include habbit. Does not wake at night to smoke Alcohol use: socially Other smokers in household or daily life: live alone  Identify social support: daughter, [name]  Current tobacco use: 0 cigarettes/day. Quit 11/27  Current Outpatient Medications  Medication Instructions   ACCU-CHEK AVIVA PLUS test strip Daily, for testing   ACCU-CHEK SOFTCLIX LANCETS lancets Daily, for testing   albuterol (VENTOLIN HFA) 108 (90 Base) MCG/ACT inhaler 2 puffs, Inhalation, Every 6 hours PRN   clobetasol ointment (TEMOVATE) 3.74 % 1 application, Topical, As needed   diclofenac sodium (VOLTAREN) 4 g, Topical, 4 times daily PRN   fluticasone (FLONASE) 50 MCG/ACT nasal spray 2 sprays, Each Nare, Daily   furosemide (LASIX) 20 MG tablet 1 tablet, Oral, Daily   lithium carbonate 300 mg, Oral, Daily   lubiprostone (AMITIZA) 24 MCG capsule Take 1 capsule (24 mcg total) by mouth 2 (two) times daily with a meal.   Lyrica 150 mg, Oral, 3 times daily   meclizine (ANTIVERT) 12.5 MG tablet 1 tablet, Oral, 3 times daily PRN   Misc Natural Products (T-RELIEF CBD+13 SL) 1 vial, Sublingual, 2 times daily PRN   NARCAN 4 MG/0.1ML LIQD nasal spray kit No dose, route, or frequency recorded.   nicotine (NICODERM CQ) 21 mg, Transdermal, Daily   ondansetron (ZOFRAN-ODT) 4 mg, Oral, As needed    oxyCODONE-acetaminophen (PERCOCET) 7.5-325 MG tablet 1 tablet, Oral, Every 6 hours PRN   potassium chloride SA (K-DUR,KLOR-CON) 20 MEQ tablet 20 mEq, Daily   simvastatin (ZOCOR) 40 MG tablet 1 tablet, Oral, Daily at bedtime   tiZANidine (ZANAFLEX) 4 mg, Oral, 2 times daily PRN   topiramate (TOPAMAX) 50 mg, Oral, Daily   Victoza 0.6 mg, Subcutaneous, Daily    Assessment/Plan: Patient has successfully quit smoking on 03/25/2021, congratulated on success. Currently using nicotine patch for smoking cessation pharmacotherapy. During our conversation she was driving and reported having a patch on. Denies adverse effects of nicoderm. Only complaint patient had was she was feeling irritated. Discussed these feelings will not last forever and encouraged her to avoid those who may trigger her while she is going through this transition.   Patient prescribed: _0  Nicotine patch 21 mg  Patient was advised to contact Pulmonary Clinic if questions/concerns arise. Patient verbalized understanding of information.  Follow up after Christmas, per patient request.   Time spent: 10 minutes  Joseph Art, Pharm.D. PGY-1 Pharmacy Resident 04/06/2021 2:38 PM

## 2021-04-10 ENCOUNTER — Ambulatory Visit
Admission: RE | Admit: 2021-04-10 | Discharge: 2021-04-10 | Disposition: A | Discharge status: Home / Self Care | Payer: Medicare HMO | Source: Ambulatory Visit | Attending: Family Medicine | Admitting: Family Medicine

## 2021-04-10 ENCOUNTER — Other Ambulatory Visit: Payer: Self-pay

## 2021-04-10 DIAGNOSIS — M5412 Radiculopathy, cervical region: Secondary | ICD-10-CM

## 2021-04-10 DIAGNOSIS — M958 Other specified acquired deformities of musculoskeletal system: Secondary | ICD-10-CM

## 2021-04-10 DIAGNOSIS — M25512 Pain in left shoulder: Secondary | ICD-10-CM

## 2021-04-26 ENCOUNTER — Other Ambulatory Visit: Payer: Self-pay | Admitting: Internal Medicine

## 2021-04-26 DIAGNOSIS — Z720 Tobacco use: Secondary | ICD-10-CM

## 2021-04-26 NOTE — Telephone Encounter (Signed)
Nicoderm refilled at [facility]

## 2021-05-07 ENCOUNTER — Telehealth: Payer: Self-pay

## 2021-05-07 NOTE — Telephone Encounter (Signed)
° °  Melanie Reynolds is a 71 y.o. female and was referred to the pharmacist telephone-based smoking cessation service by pulmonologist Dr. Annamaria Boots.  At last telephone visit on 04/06/2021.  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 Yes  Most recent quit attempt: 6 years Longest time ever been tobacco free: 7, starting smoking again when mother died  What helped? Will power What was difficult? Loniless and    Tobacco Use Habits: Triggers include habbit. Does not wake at night to smoke Alcohol use: socially Other smokers in household or daily life: live alone  Identify social support: daughter, [name]   Current tobacco use: 0 cigarettes/day. Quit 11/27  Current Outpatient Medications  Medication Instructions   ACCU-CHEK AVIVA PLUS test strip Daily, for testing   ACCU-CHEK SOFTCLIX LANCETS lancets Daily, for testing   albuterol (VENTOLIN HFA) 108 (90 Base) MCG/ACT inhaler 2 puffs, Inhalation, Every 6 hours PRN   clobetasol ointment (TEMOVATE) 8.09 % 1 application, Topical, As needed   diclofenac sodium (VOLTAREN) 4 g, Topical, 4 times daily PRN   fluticasone (FLONASE) 50 MCG/ACT nasal spray 2 sprays, Each Nare, Daily   furosemide (LASIX) 20 MG tablet 1 tablet, Oral, Daily   lithium carbonate 300 mg, Oral, Daily   lubiprostone (AMITIZA) 24 MCG capsule Take 1 capsule (24 mcg total) by mouth 2 (two) times daily with a meal.   Lyrica 150 mg, Oral, 3 times daily   meclizine (ANTIVERT) 12.5 MG tablet 1 tablet, Oral, 3 times daily PRN   Misc Natural Products (T-RELIEF CBD+13 SL) 1 vial, Sublingual, 2 times daily PRN   NARCAN 4 MG/0.1ML LIQD nasal spray kit No dose, route, or frequency recorded.   nicotine (NICODERM CQ - DOSED IN MG/24 HOURS) 21 mg, Transdermal, Daily   ondansetron (ZOFRAN-ODT) 4 mg, Oral, As  needed   oxyCODONE-acetaminophen (PERCOCET) 7.5-325 MG tablet 1 tablet, Oral, Every 6 hours PRN   potassium chloride SA (K-DUR,KLOR-CON) 20 MEQ tablet 20 mEq, Daily   simvastatin (ZOCOR) 40 MG tablet 1 tablet, Oral, Daily at bedtime   tiZANidine (ZANAFLEX) 4 mg, Oral, 2 times daily PRN   topiramate (TOPAMAX) 50 mg, Oral, Daily   Victoza 0.6 mg, Subcutaneous, Daily    Assessment/Plan: Patient has successfully quit smoking on 03/25/2021, congratulated on success. Currently using nicotine patch for smoking cessation pharmacotherapy. Denies any adverse effects. Patient reports that she is continuing to wear the patch daily, except for today. After showering this morning, she did not put on a patch and reports her cravings have been terrible. Discussed that our next goal is to wean off the patches. Will decrease dose once she finishes this box. Discussed triggers, barriers, and motivators to quitting.   Patient prescribed: [x]  Nicotine patch 21 mg  Patient was advised to contact Pulmonary Clinic if questions/concerns arise. Patient verbalized understanding of information.  Follow up in 1 month  Time spent: 10 minutes  Joseph Art, Pharm.D. PGY-1 Pharmacy Resident 05/07/2021 3:04 PM

## 2021-05-16 ENCOUNTER — Other Ambulatory Visit: Payer: Self-pay | Admitting: Orthopedic Surgery

## 2021-05-18 ENCOUNTER — Telehealth: Payer: Self-pay | Admitting: Internal Medicine

## 2021-05-18 NOTE — Telephone Encounter (Signed)
Fax received from Dr. Tania Ade with [facility] to perform a Left reverse total shoulder arthroplasty on patient.  Patient needs surgery clearance. Patient was seen on 01/16/21 and is scheduled for OV on 1/24 with Dr. Annamaria Boots. Office protocol is a risk assessment can be sent to surgeon if patient has been seen in 60 days or less.   Sending to Dr. Annamaria Boots for risk assessment

## 2021-05-21 NOTE — Progress Notes (Signed)
HPI F smoker followed for OSA, complicated by Seasonal Allergy, Bipolar, Low Back Pain/ Lumbar Disc disease, DM2/ neuropathy, Hy[perlipidemeia, Tobacco use, Aortic Atherosclerosis, NPSG 06/22/13 AHI 23.4/ hr, desaturation to 82%, body weight 226 lbs  --------------------------------------------------------------------------------------------   01/16/21- 69yoF Smoker (22.5 pk yrs) followed for OSA, complicated by Seasonal Allergy, Bipolar 1, Low Back Pain/ Lumbar Disc disease, DM2/ neuropathy, Hyperlipidemia, Tobacco use, Obesity,  Referred to Dr Ron Parker for oral appliance - went back to CPAP CPAP 9/ [facility] Download- compliance 93%, AHI 3.6/ hr Body weight today-215 lbs Covid vax-3 Moderna -----OSA, mask was changed from nasal to full face mask Willing to be contacted by Pharmacy smoking cessation program. Download reviewed. Doing well. Machine due for replacement CXR 12/05/20 IMPRESSION: 1.  No radiographic evidence of acute cardiopulmonary disease. 2. Aortic atherosclerosis.  05/22/21-  37yoF Smoker (22.5 pk yrs) followed for OSA, complicated by Seasonal Allergy, Bipolar 1, Low Back Pain/ Lumbar Disc disease, DM2/ neuropathy, Hyperlipidemia, Tobacco use, Obesity, Aortic Atherosclerosis,  Referred to Dr Ron Parker for oral appliance - went back to CPAP -Ventolin hfa, Nicoderm,  CPAP 9/ [facility] Body weight today 2205 lbs Did fine with CPAP until indoor heat came on. Doesn't know how to adjust CPAP humidifier heat and can't tolerate it when home heat is on. We will help her get in touch with [employer]. Pending shoulder surgery next month. No interval medical concerns with heart or breathing, no recent infection,feels well. CT chest low dose screening 03/30/21- 1. Lung-RADS 1, negative. Continue annual screening with low-dose chest CT without contrast in 12 months. 2. Aortic atherosclerosis. Aortic Atherosclerosis (ICD10-I70.0).  ROS-see HPI   + = positive Constitutional:    weight loss, night sweats,  fevers, chills, +fatigue, lassitude. HEENT:    headaches, difficulty swallowing, tooth/dental problems, sore throat,       sneezing, itching, ear ache, nasal congestion, post nasal drip, snoring CV:    chest pain, orthopnea, PND, swelling in lower extremities, anasarca,                                   dizziness, palpitations Resp:   shortness of breath with exertion or at rest.                productive cough,   non-productive cough, coughing up of blood.              change in color of mucus.  wheezing.   Skin:    rash or lesions. GI:  No-   heartburn, indigestion, abdominal pain, nausea, vomiting, diarrhea,                 change in bowel habits, loss of appetite GU: dysuria, change in color of urine, no urgency or frequency.   flank pain. MS:   joint pain, stiffness, decreased range of motion, back pain. Neuro-     nothing unusual Psych:  change in mood or affect.  depression or anxiety.   memory loss.  OBJ- Physical Exam General- Alert, Oriented, Affect-appropriate, Distress- none acute, + obese Skin- rash-none, lesions- none, excoriation- none Lymphadenopathy- none Head- atraumatic            Eyes- Gross vision intact, PERRLA, conjunctivae and secretions clear            Ears- Hearing, canals-normal            Nose- Clear, no-Septal dev, mucus, polyps, erosion, perforation  Throat- Mallampati III-IV , mucosa clear , drainage- none, tonsils- atrophic, + missing teeth Neck- flexible , trachea midline, no stridor , thyroid nl, carotid no bruit Chest - symmetrical excursion , unlabored           Heart/CV- RRR , no murmur , no gallop  , no rub, nl s1 s2                           - JVD- none , edema- none, stasis changes- none, varices- none           Lung- clear to P&A, wheeze- none, cough- none , dullness-none, rub- none           Chest wall-  Abd-  Br/ Gen/ Rectal- Not done, not indicated Extrem- cyanosis- none, clubbing, none, atrophy- none, strength- nl Neuro-  grossly intact to observation

## 2021-05-22 ENCOUNTER — Encounter: Payer: Self-pay | Admitting: Internal Medicine

## 2021-05-22 ENCOUNTER — Ambulatory Visit (INDEPENDENT_AMBULATORY_CARE_PROVIDER_SITE_OTHER): Payer: Medicare HMO | Admitting: Internal Medicine

## 2021-05-22 ENCOUNTER — Telehealth: Payer: Self-pay | Admitting: Internal Medicine

## 2021-05-22 ENCOUNTER — Other Ambulatory Visit: Payer: Self-pay

## 2021-05-22 VITALS — BP 110/72 | HR 65 | Temp 98.5°F | Ht 63.5 in | Wt 205.0 lb

## 2021-05-22 DIAGNOSIS — Z01818 Encounter for other preprocedural examination: Secondary | ICD-10-CM | POA: Diagnosis not present

## 2021-05-22 DIAGNOSIS — G4733 Obstructive sleep apnea (adult) (pediatric): Secondary | ICD-10-CM

## 2021-05-22 DIAGNOSIS — Z720 Tobacco use: Secondary | ICD-10-CM

## 2021-05-22 NOTE — Assessment & Plan Note (Signed)
Benefits from CPAP when used. Needs instruction to adjust heat so she can tolerate it while home indoor heat is on. Plan- continue CPAP 9. DME [facility] to instruct how to adjust heat/ humidifier

## 2021-05-22 NOTE — Assessment & Plan Note (Signed)
Emphasis on smoking cessation Invite to participate in pharmacy program

## 2021-05-22 NOTE — Telephone Encounter (Signed)
OV notes and clearance form have been faxed back to [facility]. Nothing further needed at this time.  ?

## 2021-05-22 NOTE — Patient Instructions (Addendum)
You are clear from a pulmonary standpoint to go ahead with planned shoulder surgery. You do have sleep apnea and should wear CPAP at 9 cwp or autopap 5-15 at night and in Recovery.  Order- DME [facility]- please instruct Melanie Reynolds on how to adjust the heat on her CPAP machine   Please call if we can help

## 2021-05-22 NOTE — Telephone Encounter (Signed)
Ov note and clearance done

## 2021-05-22 NOTE — Telephone Encounter (Signed)
Called and spoke with patient and informed her that new order is in place for new cpap. Nothing else needed at this time

## 2021-05-22 NOTE — Assessment & Plan Note (Signed)
She is currently stable and clear to proceed with planned shoulder surgery from a pulmonary standpoint. She should wear CPAP 9 or auto 5-15 while sleeping and in recovery, as well as on return home.

## 2021-05-23 NOTE — Progress Notes (Addendum)
Anesthesia Review:  PCP: DR Reece Levy on [address]  Cardiologist : none  Pulm- DR [name]- LOV 05/22/21  Chest x-ray : 02/23/21  04/02/21- Ct chest lung  EKG :02/26/21  Echo : 2020  Stress test: Cardiac Cath :  Activity level: cannot do a flgiht of stairs without difficulty  due to neuropathy and back pain  Sleep Study/ CPAP : has cpap  Fasting Blood Sugar :      / Checks Blood Sugar -- times a day:   Blood Thinner/ Instructions /Last Dose: ASA / Instructions/ Last Dose :   DM- type 2 Checks glucose once daily per pt  Hgba1c- 05/28/21 - 5.9  No covid test ambulatory surgery

## 2021-05-24 NOTE — Progress Notes (Signed)
Your procedure is scheduled on:        06/14/21   Report to [facility] Main  Entrance   Report to admitting at  [place] AM     Call this number if you have problems the morning of surgery [phone]    REMEMBER: NO  SOLID FOOD CANDY OR GUM AFTER MIDNIGHT. CLEAR LIQUIDS UNTIL  0430am        . NOTHING BY MOUTH EXCEPT CLEAR LIQUIDS UNTIL  0430am   . PLEASE FINISH ENSURE DRINK PER SURGEON ORDER  WHICH NEEDS TO BE COMPLETED AT   0430am    .      CLEAR LIQUID DIET   Foods Allowed                                                                    Coffee and tea, regular and decaf                            Fruit ices (not with fruit pulp)                                      Iced Popsicles                                    Carbonated beverages, regular and diet                                    Cranberry, grape and apple juices Sports drinks like Gatorade Lightly seasoned clear broth or consume(fat free) Sugar, honey syrup ___________________________________________________________________      BRUSH YOUR TEETH MORNING OF SURGERY AND RINSE YOUR MOUTH OUT, NO CHEWING GUM CANDY OR MINTS.     Take these medicines the morning of surgery with A SIP OF WATER:  inhalers as usual and bring, lithium, topamax, lyrica   DO NOT TAKE ANY DIABETIC MEDICATIONS DAY OF YOUR SURGERY                               You may not have any metal on your body including hair pins and              piercings  Do not wear jewelry, make-up, lotions, powders or perfumes, deodorant             Do not wear nail polish on your fingernails.  Do not shave  48 hours prior to surgery.              Men may shave face and neck.   Do not bring valuables to the hospital. [place].  Contacts, dentures or bridgework may not be worn into surgery.  Leave suitcase in the car. After surgery it may be brought to your room.     Patients  discharged the  day of surgery will not be allowed to drive home. IF YOU ARE HAVING SURGERY AND GOING HOME THE SAME DAY, YOU MUST HAVE AN ADULT TO DRIVE YOU HOME AND BE WITH YOU FOR 24 HOURS. YOU MAY GO HOME BY TAXI OR UBER OR ORTHERWISE, BUT AN ADULT MUST ACCOMPANY YOU HOME AND STAY WITH YOU FOR 24 HOURS.  Name and phone number of your driver:  Special Instructions: N/A              Please read over the following fact sheets you were given: _____________________________________________________________________  [facility] - Preparing for Surgery Before surgery, you can play an important role.  Because skin is not sterile, your skin needs to be as free of germs as possible.  You can reduce the number of germs on your skin by washing with CHG (chlorahexidine gluconate) soap before surgery.  CHG is an antiseptic cleaner which kills germs and bonds with the skin to continue killing germs even after washing. Please DO NOT use if you have an allergy to CHG or antibacterial soaps.  If your skin becomes reddened/irritated stop using the CHG and inform your nurse when you arrive at Short Stay. Do not shave (including legs and underarms) for at least 48 hours prior to the first CHG shower.  You may shave your face/neck. Please follow these instructions carefully:  1.  Shower with CHG Soap the night before surgery and the  morning of Surgery.  2.  If you choose to wash your hair, wash your hair first as usual with your  normal  shampoo.  3.  After you shampoo, rinse your hair and body thoroughly to remove the  shampoo.                           4.  Use CHG as you would any other liquid soap.  You can apply chg directly  to the skin and wash                       Gently with a scrungie or clean washcloth.  5.  Apply the CHG Soap to your body ONLY FROM THE NECK DOWN.   Do not use on face/ open                           Wound or open sores. Avoid contact with eyes, ears mouth and genitals (private parts).                        Wash face,  Genitals (private parts) with your normal soap.             6.  Wash thoroughly, paying special attention to the area where your surgery  will be performed.  7.  Thoroughly rinse your body with warm water from the neck down.  8.  DO NOT shower/wash with your normal soap after using and rinsing off  the CHG Soap.                9.  Pat yourself dry with a clean towel.            10.  Wear clean pajamas.            11.  Place clean sheets on your bed the night of your first shower and do not  sleep with pets.  Day of Surgery : Do not apply any lotions/deodorants the morning of surgery.  Please wear clean clothes to the hospital/surgery center.  FAILURE TO FOLLOW THESE INSTRUCTIONS MAY RESULT IN THE CANCELLATION OF YOUR SURGERY PATIENT SIGNATURE_________________________________  NURSE SIGNATURE__________________________________  ________________________________________________________________________

## 2021-05-28 ENCOUNTER — Encounter (HOSPITAL_COMMUNITY): Payer: Self-pay

## 2021-05-28 ENCOUNTER — Encounter (HOSPITAL_COMMUNITY)
Admission: RE | Admit: 2021-05-28 | Discharge: 2021-05-28 | Disposition: A | Discharge status: Home / Self Care | Payer: Medicare HMO | Source: Ambulatory Visit | Attending: Orthopedic Surgery | Admitting: Orthopedic Surgery

## 2021-05-28 ENCOUNTER — Other Ambulatory Visit: Payer: Self-pay

## 2021-05-28 VITALS — BP 111/70 | HR 60 | Temp 98.3°F | Resp 18 | Ht 63.0 in | Wt 205.4 lb

## 2021-05-28 DIAGNOSIS — E119 Type 2 diabetes mellitus without complications: Secondary | ICD-10-CM | POA: Diagnosis not present

## 2021-05-28 DIAGNOSIS — Z01812 Encounter for preprocedural laboratory examination: Secondary | ICD-10-CM | POA: Diagnosis not present

## 2021-05-28 DIAGNOSIS — Z01818 Encounter for other preprocedural examination: Secondary | ICD-10-CM

## 2021-05-28 HISTORY — DX: Chronic obstructive pulmonary disease, unspecified: J44.9

## 2021-05-28 LAB — BASIC METABOLIC PANEL
Anion gap: 5 (ref 5–15)
BUN: 13 mg/dL (ref 8–23)
CO2: 23 mmol/L (ref 22–32)
Calcium: 9.1 mg/dL (ref 8.9–10.3)
Chloride: 111 mmol/L (ref 98–111)
Creatinine, Ser: 0.86 mg/dL (ref 0.44–1.00)
GFR, Estimated: >60 mL/min (ref >60)
Glucose, Bld: 106 mg/dL — ABNORMAL HIGH (ref 70–99)
Potassium: 4.3 mmol/L (ref 3.5–5.1)
Sodium: 139 mmol/L (ref 135–145)

## 2021-05-28 LAB — CBC
HCT: 42.5 % (ref 36.0–46.0)
Hemoglobin: 13.2 g/dL (ref 12.0–15.0)
MCH: 28.9 pg (ref 26.0–34.0)
MCHC: 31.1 g/dL (ref 30.0–36.0)
MCV: 93.2 fL (ref 80.0–100.0)
Platelets: 348 10*3/uL (ref 150–400)
RBC: 4.56 MIL/uL (ref 3.87–5.11)
RDW: 15.5 % (ref 11.5–15.5)
WBC: 8.7 10*3/uL (ref 4.0–10.5)
nRBC: 0 % (ref 0.0–0.2)

## 2021-05-28 LAB — HEMOGLOBIN A1C
Hgb A1c MFr Bld: 5.9 % — ABNORMAL HIGH (ref 4.8–5.6)
Mean Plasma Glucose: 122.63 mg/dL

## 2021-05-28 LAB — GLUCOSE, CAPILLARY: Glucose-Capillary: 115 mg/dL — ABNORMAL HIGH (ref 70–99)

## 2021-05-28 LAB — SURGICAL PCR SCREEN
MRSA, PCR: NEGATIVE
Staphylococcus aureus: POSITIVE — AB

## 2021-05-28 NOTE — Progress Notes (Signed)
[facility]- Preparing for Total Shoulder Arthroplasty  °  °Before surgery, you can play an important role. Because skin is not sterile, your skin needs to be as free of germs as possible. You can reduce the number of germs on your skin by using the following products. °Benzoyl Peroxide Gel °Reduces the number of germs present on the skin °Applied twice a day to shoulder area starting two days before surgery   ° °================================================================== ° °Please follow these instructions carefully: ° °BENZOYL PEROXIDE 5% GEL ° °Please do not use if you have an allergy to benzoyl peroxide.   If your skin becomes reddened/irritated stop using the benzoyl peroxide. ° °Starting two days before surgery, apply as follows: °Apply benzoyl peroxide in the morning and at night. Apply after taking a shower. If you are not taking a shower clean entire shoulder front, back, and side along with the armpit with a clean wet washcloth. ° °Place a quarter-sized dollop on your shoulder and rub in thoroughly, making sure to cover the front, back, and side of your shoulder, along with the armpit.  ° °2 days before ____ AM   ____ PM              1 day before ____ AM   ____ PM °                        °Do this twice a day for two days.  (Last application is the night before surgery, AFTER using the CHG soap as described below). ° °Do NOT apply benzoyl peroxide gel on the day of surgery.  °

## 2021-05-29 ENCOUNTER — Encounter (HOSPITAL_COMMUNITY): Payer: Medicare HMO

## 2021-06-13 NOTE — Anesthesia Preprocedure Evaluation (Addendum)
Anesthesia Evaluation  Patient identified by MRN, date of birth, ID band Patient awake    Reviewed: Allergy & Precautions, NPO status , Patient's Chart, lab work & pertinent test results  Airway Mallampati: II  TM Distance: >3 FB Neck ROM: Full    Dental  (+) Dental Advisory Given, Partial Upper, Missing   Pulmonary sleep apnea and Continuous Positive Airway Pressure Ventilation , COPD,  COPD inhaler, former smoker,    Pulmonary exam normal breath sounds clear to auscultation       Cardiovascular Exercise Tolerance: Good negative cardio ROS Normal cardiovascular exam Rhythm:Regular Rate:Normal     Neuro/Psych PSYCHIATRIC DISORDERS Anxiety Depression Bipolar Disorder negative neurological ROS     GI/Hepatic negative GI ROS, Neg liver ROS,   Endo/Other  diabetes, Type 2, Oral Hypoglycemic AgentsObesity   Renal/GU negative Renal ROS     Musculoskeletal  (+) Arthritis ,   Abdominal   Peds  Hematology negative hematology ROS (+)   Anesthesia Other Findings   Reproductive/Obstetrics                           Anesthesia Physical Anesthesia Plan  ASA: 3  Anesthesia Plan: General   Post-op Pain Management: Regional block* and Tylenol PO (pre-op)*   Induction: Intravenous  PONV Risk Score and Plan: 3 and Dexamethasone, Ondansetron and Midazolam  Airway Management Planned: Oral ETT  Additional Equipment:   Intra-op Plan:   Post-operative Plan: Extubation in OR  Informed Consent: I have reviewed the patients History and Physical, chart, labs and discussed the procedure including the risks, benefits and alternatives for the proposed anesthesia with the patient or authorized representative who has indicated his/her understanding and acceptance.     Dental advisory given  Plan Discussed with: CRNA  Anesthesia Plan Comments:        Anesthesia Quick Evaluation

## 2021-06-14 ENCOUNTER — Ambulatory Visit (HOSPITAL_COMMUNITY)
Admission: RE | Admit: 2021-06-14 | Discharge: 2021-06-14 | Disposition: A | Discharge status: Home / Self Care | Payer: Medicare HMO | Source: Ambulatory Visit | Attending: Orthopedic Surgery | Admitting: Orthopedic Surgery

## 2021-06-14 ENCOUNTER — Ambulatory Visit (HOSPITAL_BASED_OUTPATIENT_CLINIC_OR_DEPARTMENT_OTHER): Payer: Medicare HMO | Admitting: Certified Registered Nurse Anesthetist

## 2021-06-14 ENCOUNTER — Encounter (HOSPITAL_COMMUNITY)
Admission: RE | Disposition: A | Discharge status: Home / Self Care | Payer: Self-pay | Source: Ambulatory Visit | Attending: Orthopedic Surgery

## 2021-06-14 ENCOUNTER — Ambulatory Visit (HOSPITAL_COMMUNITY): Payer: Medicare HMO

## 2021-06-14 ENCOUNTER — Encounter (HOSPITAL_COMMUNITY): Payer: Self-pay | Admitting: Orthopedic Surgery

## 2021-06-14 ENCOUNTER — Ambulatory Visit (HOSPITAL_COMMUNITY): Payer: Medicare HMO | Admitting: Physician Assistant

## 2021-06-14 ENCOUNTER — Other Ambulatory Visit: Payer: Self-pay

## 2021-06-14 DIAGNOSIS — Z79899 Other long term (current) drug therapy: Secondary | ICD-10-CM | POA: Diagnosis not present

## 2021-06-14 DIAGNOSIS — F1290 Cannabis use, unspecified, uncomplicated: Secondary | ICD-10-CM | POA: Diagnosis not present

## 2021-06-14 DIAGNOSIS — J449 Chronic obstructive pulmonary disease, unspecified: Secondary | ICD-10-CM

## 2021-06-14 DIAGNOSIS — M19012 Primary osteoarthritis, left shoulder: Secondary | ICD-10-CM

## 2021-06-14 DIAGNOSIS — Z87891 Personal history of nicotine dependence: Secondary | ICD-10-CM | POA: Diagnosis not present

## 2021-06-14 DIAGNOSIS — F419 Anxiety disorder, unspecified: Secondary | ICD-10-CM | POA: Diagnosis not present

## 2021-06-14 DIAGNOSIS — F319 Bipolar disorder, unspecified: Secondary | ICD-10-CM | POA: Diagnosis not present

## 2021-06-14 DIAGNOSIS — Z7984 Long term (current) use of oral hypoglycemic drugs: Secondary | ICD-10-CM | POA: Diagnosis not present

## 2021-06-14 DIAGNOSIS — Z6837 Body mass index (BMI) 37.0-37.9, adult: Secondary | ICD-10-CM | POA: Diagnosis not present

## 2021-06-14 DIAGNOSIS — M75122 Complete rotator cuff tear or rupture of left shoulder, not specified as traumatic: Secondary | ICD-10-CM | POA: Diagnosis not present

## 2021-06-14 DIAGNOSIS — E119 Type 2 diabetes mellitus without complications: Secondary | ICD-10-CM | POA: Diagnosis not present

## 2021-06-14 DIAGNOSIS — Z01811 Encounter for preprocedural respiratory examination: Secondary | ICD-10-CM

## 2021-06-14 DIAGNOSIS — E669 Obesity, unspecified: Secondary | ICD-10-CM | POA: Diagnosis not present

## 2021-06-14 DIAGNOSIS — G4733 Obstructive sleep apnea (adult) (pediatric): Secondary | ICD-10-CM | POA: Insufficient documentation

## 2021-06-14 DIAGNOSIS — Z96612 Presence of left artificial shoulder joint: Secondary | ICD-10-CM

## 2021-06-14 HISTORY — PX: REVERSE SHOULDER ARTHROPLASTY: SHX5054

## 2021-06-14 LAB — GLUCOSE, CAPILLARY
Glucose-Capillary: 128 mg/dL — ABNORMAL HIGH (ref 70–99)
Glucose-Capillary: 162 mg/dL — ABNORMAL HIGH (ref 70–99)

## 2021-06-14 LAB — TYPE AND SCREEN
ABO/RH(D): B POS
Antibody Screen: NEGATIVE

## 2021-06-14 LAB — ABO/RH: ABO/RH(D): B POS

## 2021-06-14 SURGERY — ARTHROPLASTY, SHOULDER, TOTAL, REVERSE
Anesthesia: General | Site: Shoulder | Laterality: Left

## 2021-06-14 MED ORDER — BUPIVACAINE HCL (PF) 0.5 % IJ SOLN
INTRAMUSCULAR | Status: DC | PRN
  Administered 2021-06-14: 10 mL via PERINEURAL

## 2021-06-14 MED ORDER — ROCURONIUM BROMIDE 10 MG/ML (PF) SYRINGE
PREFILLED_SYRINGE | INTRAVENOUS | Status: DC | PRN
Start: 2021-06-14 — End: 2021-06-14
  Administered 2021-06-14: 60 mg via INTRAVENOUS

## 2021-06-14 MED ORDER — FENTANYL CITRATE PF 50 MCG/ML IJ SOSY
25.0000 ug | PREFILLED_SYRINGE | INTRAMUSCULAR | Status: DC | PRN
  Administered 2021-06-14 (×3): 50 ug via INTRAVENOUS

## 2021-06-14 MED ORDER — CHLORHEXIDINE GLUCONATE 0.12 % MT SOLN
15.0000 mL | Freq: Once | OROMUCOSAL | Status: AC
  Administered 2021-06-14: 15 mL via OROMUCOSAL

## 2021-06-14 MED ORDER — SODIUM CHLORIDE 0.9 % IR SOLN
Status: DC | PRN
  Administered 2021-06-14: 1000 mL

## 2021-06-14 MED ORDER — ORAL CARE MOUTH RINSE: 15.0000 mL | Freq: Once | OROMUCOSAL | Status: AC

## 2021-06-14 MED ORDER — ONDANSETRON HCL 4 MG/2ML IJ SOLN
INTRAMUSCULAR | Status: DC | PRN
  Administered 2021-06-14: 4 mg via INTRAVENOUS

## 2021-06-14 MED ORDER — PHENYLEPHRINE HCL-NACL 20-0.9 MG/250ML-% IV SOLN
INTRAVENOUS | Status: AC
  Filled 2021-06-14: qty 750

## 2021-06-14 MED ORDER — OXYCODONE-ACETAMINOPHEN 10-325 MG PO TABS: 1.0000 | ORAL_TABLET | ORAL | 0 refills | Status: AC | PRN

## 2021-06-14 MED ORDER — WATER FOR IRRIGATION, STERILE IR SOLN
Status: DC | PRN
Start: 2021-06-14 — End: 2021-06-14
  Administered 2021-06-14: 2000 mL

## 2021-06-14 MED ORDER — METHOCARBAMOL 500 MG PO TABS: 500.0000 mg | ORAL_TABLET | Freq: Four times a day (QID) | ORAL | Status: DC | PRN

## 2021-06-14 MED ORDER — DEXAMETHASONE SODIUM PHOSPHATE 4 MG/ML IJ SOLN
INTRAMUSCULAR | Status: DC | PRN
  Administered 2021-06-14: 5 mg via INTRAVENOUS

## 2021-06-14 MED ORDER — LIDOCAINE HCL (PF) 2 % IJ SOLN
INTRAMUSCULAR | Status: AC
  Filled 2021-06-14: qty 5

## 2021-06-14 MED ORDER — ASPIRIN EC 81 MG PO TBEC: 81.0000 mg | DELAYED_RELEASE_TABLET | Freq: Every day | ORAL | 0 refills | Status: AC

## 2021-06-14 MED ORDER — MIDAZOLAM HCL 5 MG/5ML IJ SOLN
INTRAMUSCULAR | Status: DC | PRN
  Administered 2021-06-14 (×2): 1 mg via INTRAVENOUS

## 2021-06-14 MED ORDER — OXYCODONE HCL 5 MG PO TABS: 10.0000 mg | ORAL_TABLET | ORAL | Status: DC | PRN

## 2021-06-14 MED ORDER — PROPOFOL 10 MG/ML IV BOLUS
INTRAVENOUS | Status: AC
  Filled 2021-06-14: qty 20

## 2021-06-14 MED ORDER — HYDROMORPHONE HCL 1 MG/ML IJ SOLN
0.5000 mg | INTRAMUSCULAR | Status: AC | PRN
  Administered 2021-06-14 (×2): 0.5 mg via INTRAVENOUS

## 2021-06-14 MED ORDER — DEXAMETHASONE SODIUM PHOSPHATE 10 MG/ML IJ SOLN
INTRAMUSCULAR | Status: AC
  Filled 2021-06-14: qty 1

## 2021-06-14 MED ORDER — TIZANIDINE HCL 4 MG PO TABS: 4.0000 mg | ORAL_TABLET | Freq: Three times a day (TID) | ORAL | 0 refills | Status: DC | PRN

## 2021-06-14 MED ORDER — CEFAZOLIN SODIUM-DEXTROSE 2-4 GM/100ML-% IV SOLN
2.0000 g | INTRAVENOUS | Status: AC
  Administered 2021-06-14: 2 g via INTRAVENOUS
  Filled 2021-06-14: qty 100

## 2021-06-14 MED ORDER — FENTANYL CITRATE PF 50 MCG/ML IJ SOSY
PREFILLED_SYRINGE | INTRAMUSCULAR | Status: AC
  Filled 2021-06-14: qty 2

## 2021-06-14 MED ORDER — FENTANYL CITRATE (PF) 100 MCG/2ML IJ SOLN
INTRAMUSCULAR | Status: AC
  Filled 2021-06-14: qty 2

## 2021-06-14 MED ORDER — PROPOFOL 10 MG/ML IV BOLUS
INTRAVENOUS | Status: DC | PRN
  Administered 2021-06-14: 150 mg via INTRAVENOUS

## 2021-06-14 MED ORDER — ONDANSETRON HCL 4 MG/2ML IJ SOLN: 4.0000 mg | Freq: Once | INTRAMUSCULAR | Status: DC | PRN

## 2021-06-14 MED ORDER — METHOCARBAMOL 500 MG IVPB - SIMPLE MED
INTRAVENOUS | Status: AC
  Filled 2021-06-14: qty 50

## 2021-06-14 MED ORDER — SUGAMMADEX SODIUM 200 MG/2ML IV SOLN
INTRAVENOUS | Status: DC | PRN
  Administered 2021-06-14: 200 mg via INTRAVENOUS

## 2021-06-14 MED ORDER — BUPIVACAINE LIPOSOME 1.3 % IJ SUSP
INTRAMUSCULAR | Status: DC | PRN
  Administered 2021-06-14: 10 mL via PERINEURAL

## 2021-06-14 MED ORDER — PHENYLEPHRINE HCL-NACL 20-0.9 MG/250ML-% IV SOLN
INTRAVENOUS | Status: DC | PRN
  Administered 2021-06-14: 30 ug/min via INTRAVENOUS

## 2021-06-14 MED ORDER — FENTANYL CITRATE (PF) 100 MCG/2ML IJ SOLN
INTRAMUSCULAR | Status: DC | PRN
  Administered 2021-06-14 (×2): 50 ug via INTRAVENOUS

## 2021-06-14 MED ORDER — MIDAZOLAM HCL 2 MG/2ML IJ SOLN
INTRAMUSCULAR | Status: AC
  Filled 2021-06-14: qty 2

## 2021-06-14 MED ORDER — OXYCODONE HCL 5 MG PO TABS: 5.0000 mg | ORAL_TABLET | ORAL | Status: DC | PRN

## 2021-06-14 MED ORDER — 0.9 % SODIUM CHLORIDE (POUR BTL) OPTIME
TOPICAL | Status: DC | PRN
Start: 2021-06-14 — End: 2021-06-14
  Administered 2021-06-14: 1000 mL

## 2021-06-14 MED ORDER — HYDROMORPHONE HCL 1 MG/ML IJ SOLN
INTRAMUSCULAR | Status: AC
  Filled 2021-06-14: qty 1

## 2021-06-14 MED ORDER — ONDANSETRON HCL 4 MG/2ML IJ SOLN
INTRAMUSCULAR | Status: AC
  Filled 2021-06-14: qty 2

## 2021-06-14 MED ORDER — HYDROMORPHONE HCL 1 MG/ML IJ SOLN: 0.5000 mg | INTRAMUSCULAR | Status: DC | PRN

## 2021-06-14 MED ORDER — EPHEDRINE 5 MG/ML INJ
INTRAVENOUS | Status: AC
  Filled 2021-06-14: qty 5

## 2021-06-14 MED ORDER — ROCURONIUM BROMIDE 10 MG/ML (PF) SYRINGE
PREFILLED_SYRINGE | INTRAVENOUS | Status: AC
  Filled 2021-06-14: qty 10

## 2021-06-14 MED ORDER — TRANEXAMIC ACID-NACL 1000-0.7 MG/100ML-% IV SOLN
1000.0000 mg | INTRAVENOUS | Status: AC
  Administered 2021-06-14: 1000 mg via INTRAVENOUS
  Filled 2021-06-14: qty 100

## 2021-06-14 MED ORDER — EPHEDRINE SULFATE-NACL 50-0.9 MG/10ML-% IV SOSY
PREFILLED_SYRINGE | INTRAVENOUS | Status: DC | PRN
  Administered 2021-06-14 (×2): 5 mg via INTRAVENOUS

## 2021-06-14 MED ORDER — LACTATED RINGERS IV SOLN: INTRAVENOUS | Status: DC

## 2021-06-14 MED ORDER — METHOCARBAMOL 500 MG IVPB - SIMPLE MED
500.0000 mg | Freq: Four times a day (QID) | INTRAVENOUS | Status: DC | PRN
  Administered 2021-06-14: 500 mg via INTRAVENOUS

## 2021-06-14 MED ORDER — FENTANYL CITRATE PF 50 MCG/ML IJ SOSY
PREFILLED_SYRINGE | INTRAMUSCULAR | Status: AC
  Filled 2021-06-14: qty 1

## 2021-06-14 MED ORDER — ACETAMINOPHEN 500 MG PO TABS
1000.0000 mg | ORAL_TABLET | Freq: Once | ORAL | Status: AC
  Administered 2021-06-14: 1000 mg via ORAL
  Filled 2021-06-14: qty 2

## 2021-06-14 SURGICAL SUPPLY — 74 items
BAG COUNTER SPONGE SURGICOUNT (BAG) IMPLANT
BAG ZIPLOCK 12X15 (MISCELLANEOUS) ×2 IMPLANT
BASEPLATE P2 COATD GLND 6.5X30 (Shoulder) IMPLANT
BIT DRILL 1.6MX128 (BIT) IMPLANT
BIT DRILL 2.5 DIA 127 CALI (BIT) ×1 IMPLANT
BIT DRILL 4 DIA CALIBRATED (BIT) ×1 IMPLANT
BLADE SAW SAG 73X25 THK (BLADE) ×1
BLADE SAW SGTL 73X25 THK (BLADE) ×1 IMPLANT
BOOTIES KNEE HIGH SLOAN (MISCELLANEOUS) ×4 IMPLANT
COOLER ICEMAN CLASSIC (MISCELLANEOUS) IMPLANT
COVER BACK TABLE 60X90IN (DRAPES) ×2 IMPLANT
COVER SURGICAL LIGHT HANDLE (MISCELLANEOUS) ×2 IMPLANT
DRAPE INCISE IOBAN 66X45 STRL (DRAPES) ×2 IMPLANT
DRAPE ORTHO SPLIT 77X108 STRL (DRAPES) ×2
DRAPE POUCH INSTRU U-SHP 10X18 (DRAPES) ×2 IMPLANT
DRAPE SHEET LG 3/4 BI-LAMINATE (DRAPES) ×2 IMPLANT
DRAPE SURG 17X11 SM STRL (DRAPES) ×2 IMPLANT
DRAPE SURG ORHT 6 SPLT 77X108 (DRAPES) ×2 IMPLANT
DRAPE TOP 10253 STERILE (DRAPES) ×2 IMPLANT
DRAPE U-SHAPE 47X51 STRL (DRAPES) ×2 IMPLANT
DRESSING AQUACEL AG SP 3.5X10 (GAUZE/BANDAGES/DRESSINGS) IMPLANT
DRSG AQUACEL AG ADV 3.5X 6 (GAUZE/BANDAGES/DRESSINGS) ×2 IMPLANT
DRSG AQUACEL AG SP 3.5X10 (GAUZE/BANDAGES/DRESSINGS) ×2
DURAPREP 26ML APPLICATOR (WOUND CARE) ×4 IMPLANT
ELECT BLADE TIP CTD 4 INCH (ELECTRODE) ×2 IMPLANT
ELECT REM PT RETURN 15FT ADLT (MISCELLANEOUS) ×2 IMPLANT
GLOVE SRG 8 PF TXTR STRL LF DI (GLOVE) ×1 IMPLANT
GLOVE SURG ENC MOIS LTX SZ7.5 (GLOVE) ×2 IMPLANT
GLOVE SURG POLYISO LF SZ6.5 (GLOVE) ×2 IMPLANT
GLOVE SURG UNDER POLY LF SZ6.5 (GLOVE) ×2 IMPLANT
GLOVE SURG UNDER POLY LF SZ8 (GLOVE) ×1
GOWN STRL REUS W/TWL LRG LVL3 (GOWN DISPOSABLE) ×2 IMPLANT
GOWN STRL REUS W/TWL XL LVL3 (GOWN DISPOSABLE) ×2 IMPLANT
HANDPIECE INTERPULSE COAX TIP (DISPOSABLE) ×1
HOOD PEEL AWAY FLYTE STAYCOOL (MISCELLANEOUS) ×6 IMPLANT
INSERT SMALL SOCKET 32MM NEU (Insert) ×1 IMPLANT
KIT BASIN OR (CUSTOM PROCEDURE TRAY) ×2 IMPLANT
KIT TURNOVER KIT A (KITS) IMPLANT
MANIFOLD NEPTUNE II (INSTRUMENTS) ×2 IMPLANT
NDL TROCAR POINT SZ 2 1/2 (NEEDLE) IMPLANT
NEEDLE TROCAR POINT SZ 2 1/2 (NEEDLE) IMPLANT
NS IRRIG 1000ML POUR BTL (IV SOLUTION) ×2 IMPLANT
P2 COATDE GLNOID BSEPLT 6.5X30 (Shoulder) ×2 IMPLANT
PACK SHOULDER (CUSTOM PROCEDURE TRAY) ×2 IMPLANT
PAD COLD SHLDR WRAP-ON (PAD) IMPLANT
PROTECTOR NERVE ULNAR (MISCELLANEOUS) IMPLANT
RESTRAINT HEAD UNIVERSAL NS (MISCELLANEOUS) IMPLANT
RETRIEVER SUT HEWSON (MISCELLANEOUS) IMPLANT
SCREW BONE LOCKING RSP 5.0X14 (Screw) ×4 IMPLANT
SCREW BONE LOCKING RSP 5.0X30 (Screw) ×4 IMPLANT
SCREW BONE RSP LOCK 5X14 (Screw) IMPLANT
SCREW BONE RSP LOCK 5X30 (Screw) IMPLANT
SCREW RETAIN W/HEAD 4MM OFFSET (Shoulder) ×1 IMPLANT
SET HNDPC FAN SPRY TIP SCT (DISPOSABLE) ×1 IMPLANT
SLING ARM IMMOBILIZER LRG (SOFTGOODS) IMPLANT
SLING ARM IMMOBILIZER MED (SOFTGOODS) ×1 IMPLANT
SPONGE T-LAP 18X18 ~~LOC~~+RFID (SPONGE) ×2 IMPLANT
SPONGE T-LAP 4X18 ~~LOC~~+RFID (SPONGE) ×2 IMPLANT
STEM HUMERAL 8X48 SHOULDER (Miscellaneous) ×1 IMPLANT
STRIP CLOSURE SKIN 1/2X4 (GAUZE/BANDAGES/DRESSINGS) ×3 IMPLANT
SUCTION FRAZIER HANDLE 10FR (MISCELLANEOUS)
SUCTION TUBE FRAZIER 10FR DISP (MISCELLANEOUS) IMPLANT
SUPPORT WRAP ARM LG (MISCELLANEOUS) IMPLANT
SUT ETHIBOND 2 V 37 (SUTURE) IMPLANT
SUT FIBERWIRE #2 38 REV NDL BL (SUTURE) ×2
SUT MNCRL AB 4-0 PS2 18 (SUTURE) ×2 IMPLANT
SUT VIC AB 2-0 CT1 27 (SUTURE) ×2
SUT VIC AB 2-0 CT1 TAPERPNT 27 (SUTURE) ×1 IMPLANT
SUTURE FIBERWR#2 38 REV NDL BL (SUTURE) IMPLANT
TAPE LABRALWHITE 1.5X36 (TAPE) IMPLANT
TAPE SUT LABRALTAP WHT/BLK (SUTURE) IMPLANT
TOWEL OR 17X26 10 PK STRL BLUE (TOWEL DISPOSABLE) ×2 IMPLANT
TOWEL OR NON WOVEN STRL DISP B (DISPOSABLE) ×2 IMPLANT
WATER STERILE IRR 1000ML POUR (IV SOLUTION) ×2 IMPLANT

## 2021-06-14 NOTE — Anesthesia Postprocedure Evaluation (Signed)
Anesthesia Post Note  Patient: Melanie Reynolds  Procedure(s) Performed: REVERSE SHOULDER ARTHROPLASTY (Left: Shoulder)     Patient location during evaluation: PACU Anesthesia Type: General Level of consciousness: awake and alert Pain management: pain level controlled Vital Signs Assessment: post-procedure vital signs reviewed and stable Respiratory status: spontaneous breathing, nonlabored ventilation, respiratory function stable and patient connected to nasal cannula oxygen Cardiovascular status: blood pressure returned to baseline and stable Postop Assessment: no apparent nausea or vomiting Anesthetic complications: no   No notable events documented.  Last Vitals:  Vitals:   06/14/21 1021 06/14/21 1046  BP: 121/77 115/69  Pulse: 94 80  Resp: 18   Temp:    SpO2: 91% 95%    Last Pain:  Vitals:   06/14/21 1021  TempSrc:   PainSc: 0-No pain                 Santa Lighter

## 2021-06-14 NOTE — Anesthesia Procedure Notes (Signed)
Procedure Name: Intubation Date/Time: 06/14/2021 7:30 AM Performed by: Claudia Desanctis, CRNA Pre-anesthesia Checklist: Patient identified, Emergency Drugs available, Suction available and Patient being monitored Patient Re-evaluated:Patient Re-evaluated prior to induction Oxygen Delivery Method: Circle system utilized Preoxygenation: Pre-oxygenation with 100% oxygen Induction Type: IV induction Ventilation: Mask ventilation without difficulty Laryngoscope Size: 2 and Miller Grade View: Grade I Tube type: Oral Tube size: 7.0 mm Number of attempts: 1 Airway Equipment and Method: Stylet Placement Confirmation: ETT inserted through vocal cords under direct vision, positive ETCO2 and breath sounds checked- equal and bilateral Secured at: 22 cm Tube secured with: Tape Dental Injury: Teeth and Oropharynx as per pre-operative assessment  Comments: Grade 1 view with laryngeal pressure

## 2021-06-14 NOTE — Op Note (Signed)
Procedure(s): REVERSE SHOULDER ARTHROPLASTY Procedure Note  Melanie Reynolds female 71 y.o. 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 female  With moderate left shoulder arthritis with full-thickness rotator cuff tear. Pain and dysfunction interfered with quality of life and nonoperative treatment with activity modification, NSAIDS and injections failed.     Surgeon: Rhae Hammock   Assistants: Sheryle Hail PA-C [name] was present and scrubbed throughout the procedure and was essential in positioning, retraction, exposure, and closure)  Anesthesia: General endotracheal anesthesia with preoperative interscalene block given by the attending anesthesiologist     Procedure Detail  REVERSE SHOULDER ARTHROPLASTY   Estimated Blood Loss:  200 mL         Drains: none  Blood Given: none          Specimens: none        Complications:  * No complications entered in OR log *         Disposition: PACU - hemodynamically stable.         Condition: stable      OPERATIVE FINDINGS:  A DJO Altivate pressfit reverse total shoulder arthroplasty was placed with a  size 8 stem, a 32-4 glenosphere, and a standard-mm poly insert. The base plate  fixation was excellent.  PROCEDURE: The patient was identified in the preoperative holding area  where I personally marked the operative site after verifying site, side,  and procedure with the patient. An interscalene block given by  the attending anesthesiologist in the holding area and the patient was taken back to the operating room where all extremities were  carefully padded in position after general anesthesia was induced. She  was placed in a beach-chair position and the operative upper extremity was  prepped and draped in a standard sterile  fashion. An approximately 10-  cm incision was made from the tip of the coracoid process to the center  point of the humerus at the level of the axilla. Dissection was carried  down through subcutaneous tissues to the level of the cephalic vein  which was taken laterally with the deltoid. The pectoralis major was  retracted medially. The subdeltoid space was developed and the lateral  edge of the conjoined tendon was identified. The undersurface of  conjoined tendon was palpated and the musculocutaneous nerve was not in  the field. Retractor was placed underneath the conjoined and second  retractor was placed lateral into the deltoid. The circumflex humeral  artery and vessels were identified and clamped and coagulated. The  biceps tendon was tenotomized.  The subscapularis was very thin and taken down as a peel.  The  joint was then gently externally rotated while the capsule was released  from the humeral neck around to just beyond the 6 o'clock position. At  this point, the joint was dislocated and the humeral head was presented  into the wound. The excessive osteophyte formation was removed with a  large rongeur.  The cutting guide was used to make the appropriate  head cut and the head was saved for potentially bone grafting.  The glenoid was exposed with the arm in an  abducted extended position. The anterior and posterior labrum were  completely excised and the capsule was released circumferentially to  allow for exposure of the glenoid for preparation. The 2.5 mm drill was  placed  using the guide in 5-10 inferior angulation and the tap was then advanced in the same hole. Small and large reamers were then used. The tap was then removed and the Metaglene was then screwed in with excellent purchase.  The peripheral guide was then used to drilled measured and filled peripheral locking screws. The size 32-4 glenosphere was then impacted on the [facility] taper and the central screw was placed.  The humerus was then again exposed and the diaphyseal reamers were used followed by the metaphyseal reamers. The final broach was left in place in the proximal trial was placed. The joint was reduced and with this implant it was felt that soft tissue tensioning was appropriate with excellent stability and excellent range of motion. Therefore, final humeral stem was placed press-fit.  And then the trial polyethylene inserts were tested again and the above implant was felt to be the most appropriate for final insertion. The joint was reduced taken through full range of motion and felt to be stable. Soft tissue tension was appropriate.  The joint was then copiously irrigated with pulse  lavage and the wound was then closed. The subscapularis was repaired with a FiberWire through bone tunnel.  Skin was closed with 2-0 Vicryl in a deep dermal layer and 4-0  Monocryl for skin closure. Steri-Strips were applied. Sterile  dressings were then applied as well as a sling. The patient was allowed  to awaken from general anesthesia, transferred to stretcher, and taken  to recovery room in stable condition.   POSTOPERATIVE PLAN: The patient will be observed in the recovery room and if her pain is well controlled with the regional block and she is hemodynamically stable she can be discharged home today with family.

## 2021-06-14 NOTE — Transfer of Care (Signed)
Immediate Anesthesia Transfer of Care Note  Patient: Melanie Reynolds  Procedure(s) Performed: REVERSE SHOULDER ARTHROPLASTY (Left: Shoulder)  Patient Location: PACU  Anesthesia Type:GA combined with regional for post-op pain  Level of Consciousness: awake and patient cooperative  Airway & Oxygen Therapy: Patient Spontanous Breathing and Patient connected to face mask  Post-op Assessment: Report given to RN and Post -op Vital signs reviewed and stable  Post vital signs: Reviewed and stable  Last Vitals:  Vitals Value Taken Time  BP 144/92 06/14/21 0900  Temp    Pulse 77 06/14/21 0902  Resp 16 06/14/21 0902  SpO2 100 % 06/14/21 0902  Vitals shown include unvalidated device data.  Last Pain:  Vitals:   06/14/21 0623  TempSrc:   PainSc: 7       Patients Stated Pain Goal: 6 (07/61/91 5502)  Complications: No notable events documented.

## 2021-06-14 NOTE — H&P (Signed)
Melanie Reynolds is an 71 y.o. female.   Chief Complaint: L shoulder pain and dysfunction HPI: L shoulder RCT with underlying arthritis, failed conservative treatment. Pain interferes with sleep and quality of life.   Past Medical History:  Diagnosis Date   Allergy    Anxiety    Arthritis    "knees, legs, back" (06/01/2013)   Bipolar depression ([place])    Cataract    Chronic lower back pain    Constipation, chronic    COPD (chronic obstructive pulmonary disease) ([place])    Depression    High cholesterol    Irritable bowel syndrome (IBS)    OSA (obstructive sleep apnea)    Osteoporosis    Pneumonia 1970's; 1980's; 06/01/2013   "total of 3 times"   Sleep apnea    cpap   Type II diabetes mellitus ([place])     Past Surgical History:  Procedure Laterality Date   ABDOMINAL HYSTERECTOMY  1982   BACK SURGERY     CARPAL TUNNEL RELEASE     right wrist- with a nerve block placed January 2020   CHOLECYSTECTOMY  1990's   COLONOSCOPY     FOREARM FRACTURE SURGERY Left 1999   "put in plates"   [place]  2013   POSTERIOR LUMBAR FUSION  2000's   SHOULDER OPEN ROTATOR CUFF REPAIR Right 2000   TUBAL LIGATION  1978   VAGAL NERVE STIMULATOR REMOVAL  2013   "got an infection in there" (06/01/2013)    Family History  Problem Relation Age of Onset   Kidney failure Mother    Stomach cancer Sister    Colon cancer Neg Hx    Esophageal cancer Neg Hx    Rectal cancer Neg Hx    Social History:  reports that she quit smoking about 4 months ago. Her smoking use included cigarettes. She has a 22.50 pack-year smoking history. She has never used smokeless tobacco. She reports that she does not currently use alcohol. She reports current drug use. Drug: Marijuana.  Allergies:  Allergies  Allergen Reactions   Morphine And Related Swelling and Rash   Latex Itching   Naproxen Other (See Comments)    Abdominal pain    Medications Prior to Admission  Medication Sig Dispense Refill    ACCU-CHEK AVIVA PLUS test strip daily. for testing  5   ACCU-CHEK SOFTCLIX LANCETS lancets daily. for testing  5   albuterol (VENTOLIN HFA) 108 (90 Base) MCG/ACT inhaler Inhale 2 puffs into the lungs every 6 (six) hours as needed for wheezing.      clobetasol ointment (TEMOVATE) 2.95 % Apply 1 application topically as needed (eczema).  5   diclofenac sodium (VOLTAREN) 1 % GEL Apply 4 g topically 4 (four) times daily as needed (for pain).   5   DULoxetine (CYMBALTA) 30 MG capsule Take 30 mg by mouth at bedtime.     fluticasone (FLONASE) 50 MCG/ACT nasal spray Place 2 sprays into both nostrils daily. (Patient taking differently: Place 2 sprays into both nostrils as needed for allergies.) 3 g 2   furosemide (LASIX) 40 MG tablet Take 40 mg by mouth daily.     lithium carbonate 300 MG capsule Take 300 mg by mouth daily.     LYRICA 150 MG capsule Take 150 mg by mouth 3 (three) times daily.  5   NON FORMULARY Place 1 Dose under the tongue 2 (two) times daily. CBD liquid     NON FORMULARY Apply 1 application topically daily as  needed (pain). CBD topical balm     oxyCODONE-acetaminophen (PERCOCET) 10-325 MG tablet Take 1 tablet by mouth every 6 (six) hours as needed for pain.     potassium chloride SA (K-DUR,KLOR-CON) 20 MEQ tablet Take 20 mEq by mouth daily.     Semaglutide,0.25 or 0.5MG/DOS, (OZEMPIC, 0.25 OR 0.5 MG/DOSE,) 2 MG/1.5ML SOPN Inject 0.5 mg into the skin every Wednesday.     simvastatin (ZOCOR) 40 MG tablet Take 1 tablet by mouth at bedtime.     Tetrahydrozoline HCl (VISINE OP) Place 1 drop into both eyes daily as needed (redness).     topiramate (TOPAMAX) 50 MG tablet Take 50 mg by mouth daily.      Vitamin D, Ergocalciferol, (DRISDOL) 1.25 MG (50000 UNIT) CAPS capsule Take 50,000 Units by mouth once a week.     lubiprostone (AMITIZA) 24 MCG capsule Take 1 capsule (24 mcg total) by mouth 2 (two) times daily with a meal. (Patient not taking: Reported on 05/22/2021) 60 capsule 3   NARCAN 4  MG/0.1ML LIQD nasal spray kit Place 1 spray into the nose once.     nicotine (NICODERM CQ - DOSED IN MG/24 HOURS) 21 mg/24hr patch Place 1 patch (21 mg total) onto the skin daily. (Patient not taking: Reported on 05/22/2021) 28 patch 3   tiZANidine (ZANAFLEX) 4 MG tablet Take 4 mg by mouth 2 (two) times daily as needed for muscle spasms.   5    Results for orders placed or performed during the hospital encounter of 06/14/21 (from the past 48 hour(s))  Glucose, capillary     Status: Abnormal   Collection Time: 06/14/21  5:45 AM  Result Value Ref Range   Glucose-Capillary 128 (H) 70 - 99 mg/dL    Comment: Glucose reference range applies only to samples taken after fasting for at least 8 hours.   No results found.  Review of Systems  All other systems reviewed and are negative.  Blood pressure 117/68, pulse 79, temperature 98.4 F (36.9 C), temperature source Oral, resp. rate 17, height _0  (1.6 m), weight 94.8 kg, SpO2 99 %. Physical Exam HENT:     Head: Atraumatic.  Eyes:     Extraocular Movements: Extraocular movements intact.  Cardiovascular:     Pulses: Normal pulses.  Pulmonary:     Effort: Pulmonary effort is normal.  Musculoskeletal:     Comments: L shoulder pain with ROM, RC testing  Skin:    General: Skin is warm and dry.  Neurological:     Mental Status: She is alert.  Psychiatric:        Mood and Affect: Mood normal.     Assessment/Plan  L shoulder RCT with underlying arthritis, failed conservative treatment.  Plan L reverse TSA Risks / benefits of surgery discussed Consent on chart  NPO for OR Preop antibiotics    Rhae Hammock, MD 06/14/2021, 7:14 AM

## 2021-06-14 NOTE — Discharge Instructions (Signed)

## 2021-06-14 NOTE — Anesthesia Procedure Notes (Signed)
Anesthesia Regional Block: Interscalene brachial plexus block   Pre-Anesthetic Checklist: , timeout performed,  Correct Patient, Correct Site, Correct Laterality,  Correct Procedure, Correct Position, site marked,  Risks and benefits discussed,  Surgical consent,  Pre-op evaluation,  At surgeon's request and post-op pain management  Laterality: Left  Prep: chloraprep       Needles:  Injection technique: Single-shot  Needle Type: Echogenic Stimulator Needle     Needle Length: 5cm  Needle Gauge: 22     Additional Needles:   Procedures:,,,, ultrasound used (permanent image in chart),,    Narrative:  Start time: 06/14/2021 6:54 AM End time: 06/14/2021 7:00 AM Injection made incrementally with aspirations every 5 mL.  Performed by: Personally  Anesthesiologist: Santa Lighter, MD  Additional Notes: Functioning IV was confirmed and monitors were applied.  A 26mm 22ga Arrow echogenic stimulator needle was used. Sterile prep and drape, hand hygiene, and sterile gloves were used.  Negative aspiration and negative test dose prior to incremental administration of local anesthetic. The patient tolerated the procedure well.  Ultrasound guidance: relevent anatomy identified, needle position confirmed, local anesthetic spread visualized around nerve(s), vascular puncture avoided.  Image printed for medical record.

## 2021-06-14 NOTE — Evaluation (Signed)
Occupational Therapy Evaluation Patient Details Name: Melanie Reynolds MRN: [id number] DOB: 07-06-1950 Today's Date: 06/14/2021   History of Present Illness Pt is a 71 year old female with moderate left shoulder arthritis with full-thickness rotator cuff tear. Pt is now s/p L rTSA on 06/14/21.   Clinical Impression   Patient was seen for an OT evaluation this date, POD 0 following L rTSA.Pt lives alone in a ground level apartment. Prior to surgery, pt utilized [facility] for community distances, household ambulation with no AD. Daughter is present throughout evaluation, however grand daughter will be providing assist for ADL/IADL. Pt reports requiring intermittent assist for ADLs. Pt has orders for LUE to be immobilized and will be NWBing per MD. Patient presents with impaired strength/ROM, pain, and sensation to LUE. These impairments result in a decreased ability to perform self care tasks requiring MOD A assist for UB dressing including sling, MOD-MAX A for LB dressing and bathing. Pt instructed in polar care mgt,  sling/immobilizer mgt, ROM exercises for LUE (with instructions for no shoulder mobility, LUE precautions, adaptive strategies for bathing/dressing/toileting/grooming, positioning and considerations for sleep, and home/routines modifications to maximize falls prevention, safety, and independence. Handout provided. OT adjusted sling/immobilizer to improve comfort, optimize positioning, and to maximize skin integrity/safety. Pt verbalized understanding of all education/training provided. Pt with reported feeling as "something is stuck" in her throat, vss and Rn notified. Symptoms improved with liquids.  Recommend discharge home with family assistance, therapy follow up per MD protocol. Pt is left as received, all needs met. Pt continues to report pain however is able to appropriately participate in evaluation.    Recommendations for follow up therapy are one component of a multi-disciplinary discharge  planning process, led by the attending physician.  Recommendations may be updated based on patient status, additional functional criteria and insurance authorization.   Follow Up Recommendations  Follow physician's recommendations for discharge plan and follow up therapies    Assistance Recommended at Discharge Intermittent Supervision/Assistance  Patient can return home with the following Assistance with cooking/housework;Assist for transportation;Help with stairs or ramp for entrance;A little help with bathing/dressing/bathroom;A little help with walking and/or transfers    Functional Status Assessment  Patient has had a recent decline in their functional status and demonstrates the ability to make significant improvements in function in a reasonable and predictable amount of time.  Equipment Recommendations  None recommended by OT (pt has shower chair)    Recommendations for Other Services       Precautions / Restrictions Precautions Precautions: Shoulder Type of Shoulder Precautions: L rTSA Shoulder Interventions: Shoulder sling/immobilizer;At all times;Off for dressing/bathing/exercises Precaution Booklet Issued: Yes (comment) Required Braces or Orthoses: Sling Restrictions Weight Bearing Restrictions: Yes LUE Weight Bearing: Non weight bearing Other Position/Activity Restrictions: Sling at all times except ADL/exercise, NWBing, AROM elbow, wrist, hand to tolerance, no P/AROM to L shoulder      Mobility Bed Mobility               General bed mobility comments: pt received in chair    Transfers Overall transfer level: Needs assistance   Transfers: Sit to/from Stand Sit to Stand: Supervision                  Balance Overall balance assessment: Needs assistance   Sitting balance-Leahy Scale: Good     Standing balance support: During functional activity, No upper extremity supported Standing balance-Leahy Scale: Good  ADL either performed or assessed with clinical judgement   ADL Overall ADL's : Needs assistance/impaired Eating/Feeding: Set up   Grooming: Wash/dry hands;Wash/dry face;Sitting;Set up Grooming Details (indicate cue type and reason): anticipated         Upper Body Dressing : Moderate assistance Upper Body Dressing Details (indicate cue type and reason): sling and shirt with frequent vcs for technique Lower Body Dressing: Moderate assistance;Maximal assistance Lower Body Dressing Details (indicate cue type and reason): socks and shoes Toilet Transfer: Min guard;Ambulation Toilet Transfer Details (indicate cue type and reason): simulated Toileting- Clothing Manipulation and Hygiene: Minimal assistance Toileting - Clothing Manipulation Details (indicate cue type and reason): to pull pants up     Functional mobility during ADLs: Supervision/safety;Min guard       Vision Baseline Vision/History: 1 Wears glasses Patient Visual Report: No change from baseline       Perception     Praxis      Pertinent Vitals/Pain Pain Assessment Pain Assessment: Faces Faces Pain Scale: Hurts whole lot Pain Location: L shoulder Pain Descriptors / Indicators: Aching, Constant, Tingling Pain Intervention(s): Limited activity within patient's tolerance, Ice applied, Monitored during session, Patient requesting pain meds-RN notified, Other (comment), Utilized relaxation techniques (pt maxed out on pain meds per nurse report)     Hand Dominance Right   Extremity/Trunk Assessment Upper Extremity Assessment Upper Extremity Assessment: LUE deficits/detail LUE Deficits / Details: AROM hand/wrist/forearm WFL, elbow approx 1/4 full AROM, AAROM L elbow WFL; shoulder NT LUE Sensation: decreased light touch LUE Coordination: decreased fine motor;decreased gross motor   Lower Extremity Assessment Lower Extremity Assessment: Generalized weakness       Communication Communication Communication:  No difficulties   Cognition Arousal/Alertness: Awake/alert Behavior During Therapy: WFL for tasks assessed/performed, Anxious Overall Cognitive Status: Within Functional Limits for tasks assessed                                 General Comments: alert and oriented x4, anxious, reporting feeling "something is stuck" in her throat- vss throughout, improved with liquids     General Comments  pt spo2 >95% throughout evaluation, BP WNL    Exercises General Exercises - Upper Extremity Elbow Flexion: AAROM, Left Elbow Extension: AAROM, Left Wrist Flexion: AROM, Left Wrist Extension: AROM, Left Digit Composite Flexion: AROM, Left Composite Extension: AROM, Left Other Exercises Other Exercises: additional education re: role of OT following surgery, discharge recommendations, qualifying for home health services (pt will need to discuss with MD- no HH OT needs at this time)   Shoulder Instructions Shoulder Instructions Donning/doffing shirt without moving shoulder: Moderate assistance Method for sponge bathing under operated UE: Moderate assistance Donning/doffing sling/immobilizer: Moderate assistance Correct positioning of sling/immobilizer: Moderate assistance ROM for elbow, wrist and digits of operated UE: Supervision/safety Sling wearing schedule (on at all times/off for ADL's): Modified independent Positioning of UE while sleeping: [place] expects to be discharged to:: Private residence Living Arrangements: Alone Available Help at Discharge: Family (grand daugther will stay with her for the first few days) Type of Home: Apartment Home Access: Level entry     Home Layout: One level               Home Equipment: Conservation officer, nature (2 wheels);Cane - single point;Shower seat          Prior Functioning/Environment Prior Level of Function : Independent/Modified Independent  Mobility Comments: will use [facility]  for community distances, history of peripheral neuropothy per pt ADLs Comments: MOD I for ADLs per pt report        OT Problem List: Decreased activity tolerance;Decreased range of motion      OT Treatment/Interventions:      OT Goals(Current goals can be found in the care plan section) Acute Rehab OT Goals Patient Stated Goal: to be pain free OT Goal Formulation: With patient Time For Goal Achievement: 06/28/21 Potential to Achieve Goals: Good  OT Frequency:      Co-evaluation              AM-PAC OT "6 Clicks" Daily Activity     Outcome Measure Help from another person eating meals?: None Help from another person taking care of personal grooming?: A Little Help from another person toileting, which includes using toliet, bedpan, or urinal?: A Little Help from another person bathing (including washing, rinsing, drying)?: A Little Help from another person to put on and taking off regular upper body clothing?: A Little Help from another person to put on and taking off regular lower body clothing?: A Lot 6 Click Score: 18   End of Session Nurse Communication: Mobility status  Activity Tolerance: Patient limited by pain Patient left: in chair;with call bell/phone within reach  OT Visit Diagnosis: Unsteadiness on feet (R26.81)                Time: 4917-9150 OT Time Calculation (min): 30 min Charges:  OT General Charges $OT Visit: 1 Visit OT Evaluation $OT Eval Moderate Complexity: 1 Mod  Shanon Payor, OTD OTR/L  06/14/21, 1:19 PM

## 2021-06-19 ENCOUNTER — Encounter (HOSPITAL_COMMUNITY): Payer: Self-pay | Admitting: Orthopedic Surgery

## 2021-07-13 ENCOUNTER — Telehealth: Payer: Self-pay

## 2021-07-13 NOTE — Telephone Encounter (Signed)
? ?  Melanie Reynolds is a 71 y.o. female and was referred to the pharmacist telephone-based smoking cessation service by pulmonologist Dr. Annamaria Boots. ? ?At last telephone visit on 05/07/21. ? ?Tobacco Use History ?Baseline tobacco use: 20 cigarettes/day ?Time to first cigarette: < 30 minutes ?Started smoking at [age] years old ?[employer]  ?  ?Quit Attempt History  ?Have you tried to quit in the past? Yes  ?Most recent quit attempt: 6 years ?Longest time ever been tobacco free: 7, starting smoking again when mother died  ?What helped? Will power ?What was difficult? Loniless and  ?  ?Tobacco Use Habits: ?Triggers include habbit. ?Does not wake at night to smoke ?Alcohol use: socially ?Other smokers in household or daily life: live alone  ?Identify social support: daughter, Melanie Reynolds ?  ? ?Current tobacco use: 0 cigarettes/day. Quit 11/27 ? ?Current Outpatient Medications  ?Medication Instructions  ? ACCU-CHEK AVIVA PLUS test strip Daily, for testing  ? ACCU-CHEK SOFTCLIX LANCETS lancets Daily, for testing  ? albuterol (VENTOLIN HFA) 108 (90 Base) MCG/ACT inhaler 2 puffs, Inhalation, Every 6 hours PRN  ? aspirin EC 81 mg, Oral, Daily, Swallow whole.  ? clobetasol ointment (TEMOVATE) 3.43 % 1 application., Topical, As needed  ? diclofenac sodium (VOLTAREN) 4 g, Topical, 4 times daily PRN  ? DULoxetine (CYMBALTA) 30 mg, Oral, Daily at bedtime  ? fluticasone (FLONASE) 50 MCG/ACT nasal spray 2 sprays, Each Nare, Daily  ? furosemide (LASIX) 40 mg, Oral, Daily  ? lithium carbonate 300 mg, Oral, Daily  ? Lyrica 150 mg, Oral, 3 times daily  ? NARCAN 4 MG/0.1ML LIQD nasal spray kit 1 spray, Nasal,  Once  ? NON FORMULARY 1 Dose, Sublingual, 2 times daily, CBD liquid  ? NON FORMULARY 1 application., Topical, Daily PRN, CBD topical balm  ? oxyCODONE-acetaminophen (PERCOCET) 10-325 MG tablet 1 tablet, Oral, Every 4 hours PRN  ? Ozempic (0.25 or 0.5 MG/DOSE) 0.5 mg, Subcutaneous, Every Wed  ? potassium chloride SA  (K-DUR,KLOR-CON) 20 MEQ tablet 20 mEq, Daily  ? simvastatin (ZOCOR) 40 MG tablet 1 tablet, Oral, Daily at bedtime  ? Tetrahydrozoline HCl (VISINE OP) 1 drop, Both Eyes, Daily PRN  ? tiZANidine (ZANAFLEX) 4 mg, Oral, Every 8 hours PRN  ? topiramate (TOPAMAX) 50 mg, Oral, Daily  ? Vitamin D (Ergocalciferol) (DRISDOL) 50,000 Units, Oral, Weekly  ? ? ?Assessment/Plan: ?Patient has successfully quit smoking on 03/25/21, congratulated on success. Currently not using pharmacotherapy for smoking cessation pharmacotherapy.  ? ?Melanie Reynolds has remained off therapy and successfully quit smoking!   ? ?Patient was advised to contact Pulmonary Clinic if questions/concerns arise. Patient verbalized understanding of information. ? ?Follow up prn ? ?Time spent: 5 minutes ? ?Joseph Art, Pharm.D. ?PGY-1 Pharmacy Resident ?07/13/2021 1:08 PM ?

## 2021-07-31 ENCOUNTER — Ambulatory Visit: Payer: Medicare HMO | Attending: Surgical | Admitting: Occupational Therapy

## 2021-07-31 ENCOUNTER — Encounter: Payer: Self-pay | Admitting: Occupational Therapy

## 2021-07-31 DIAGNOSIS — M6281 Muscle weakness (generalized): Secondary | ICD-10-CM | POA: Diagnosis present

## 2021-07-31 DIAGNOSIS — G8929 Other chronic pain: Secondary | ICD-10-CM | POA: Insufficient documentation

## 2021-07-31 DIAGNOSIS — M25612 Stiffness of left shoulder, not elsewhere classified: Secondary | ICD-10-CM | POA: Insufficient documentation

## 2021-07-31 DIAGNOSIS — M25512 Pain in left shoulder: Secondary | ICD-10-CM | POA: Diagnosis present

## 2021-07-31 NOTE — Therapy (Signed)
?OUTPATIENT OCCUPATIONAL THERAPY NEURO EVALUATION ? ?Patient Name: Melanie Reynolds ?MRN: [id number] ?DOB:1950-05-15, 71 y.o., female ?Today's Date: 07/31/2021 ? ?PCP: Margretta Sidle, MD ?REFERRING PROVIDER: Sheryle Hail, PA-C ? ? OT End of Session - 07/31/21 1804   ? ? Visit Number 1   ? Number of Visits 13   ? Authorization Type [employer] and Traditional Medicaid   ? OT Start Time 1150   ? OT Stop Time 1230   ? OT Time Calculation (min) 40 min   ? Behavior During Therapy [facility] for tasks assessed/performed   ? ?  ?  ? ?  ? ? ?Past Medical History:  ?Diagnosis Date  ? Allergy   ? Anxiety   ? Arthritis   ? "knees, legs, back" (06/01/2013)  ? Bipolar depression ([place])   ? Cataract   ? Chronic lower back pain   ? Constipation, chronic   ? COPD (chronic obstructive pulmonary disease) ([place])   ? Depression   ? High cholesterol   ? Irritable bowel syndrome (IBS)   ? OSA (obstructive sleep apnea)   ? Osteoporosis   ? Pneumonia 1970's; 1980's; 06/01/2013  ? "total of 3 times"  ? Sleep apnea   ? cpap  ? Type II diabetes mellitus ([place])   ? ?Past Surgical History:  ?Procedure Laterality Date  ? ABDOMINAL HYSTERECTOMY  1982  ? BACK SURGERY    ? CARPAL TUNNEL RELEASE    ? right wrist- with a nerve block placed January 2020  ? CHOLECYSTECTOMY  1990's  ? COLONOSCOPY    ? FOREARM FRACTURE SURGERY Left 1999  ? "put in plates"  ? IMPLANTATION VAGAL NERVE STIMULATOR  2013  ? POSTERIOR LUMBAR FUSION  2000's  ? REVERSE SHOULDER ARTHROPLASTY Left 06/14/2021  ? Procedure: REVERSE SHOULDER ARTHROPLASTY;  Surgeon: Tania Ade, MD;  Location: [facility];  Service: Orthopedics;  Laterality: Left;  ? SHOULDER OPEN ROTATOR CUFF REPAIR Right 2000  ? TUBAL LIGATION  1978  ? VAGAL NERVE STIMULATOR REMOVAL  2013  ? "got an infection in there" (06/01/2013)  ? ?Patient Active Problem List  ? Diagnosis Date Noted  ? Preoperative clearance 05/22/2021  ? Acute bronchitis 01/25/2020  ? 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 Allergic rhinitis 01/16/2017  ?  Legionella pneumonia ([place]) 01/09/2016  ? Sepsis ([place]) 01/04/2016  ? AKI (acute kidney injury) ([place]) 01/04/2016  ? Exertional dyspnea 08/29/2015  ? Tobacco user 08/29/2015  ? Obesity 08/29/2015  ? OSA (obstructive sleep apnea) 08/13/2013  ? CAP (community acquired pneumonia) 06/01/2013  ? Chest pain, musculoskeletal 06/01/2013  ? Bipolar 1 disorder ([place]) 06/01/2013  ? Hypotension 06/01/2013  ? Chronic pain 06/01/2013  ? Diabetes ([place]) 06/01/2013  ? ? ?ONSET DATE: 06/14/21 ? ?REFERRING DIAG: Y69.485 (ICD-10-CM) - History of total shoulder replacement  ? ?THERAPY DIAG:  ?Stiffness of left shoulder, not elsewhere classified ? ?Chronic left shoulder pain ? ?Muscle weakness (generalized) ? ?SUBJECTIVE:  ? ?SUBJECTIVE STATEMENT: ?I want to get the pain better ?Pt accompanied by: self ? ?PERTINENT HISTORY: ?Allergy ?Anxiety ? Arthritis ? Bipolar depression ([place]) ? Cataract ? Chronic lower back pain ? COPD (chronic obstructive pulmonary disease) ([place]) ? Depression ? OSA (obstructive sleep apnea) ?Type II diabetes mellitus ([place]) ? ? ?PRECAUTIONS: Shoulder ? ?WEIGHT BEARING RESTRICTIONS Yes TBD ? ?PAIN:  ?Are you having pain? Yes: NPRS scale: 5/10 ?Pain location: Shoulder ?Pain description: ache ?Aggravating factors: movement ?Relieving factors: ice ? ?FALLS: Has patient fallen in last 6 months? No ? ?LIVING ENVIRONMENT: ?Lives with:  lives  alone ?Lives in: House/apartment ?Stairs: No ? ? ?PLOF: Independent with basic ADLs ? ?PATIENT GOALS  I want to be able to use my arm better ? ?OBJECTIVE:  ? ?HAND DOMINANCE: Right ? ?ADLs: ?Overall ADLs: modified independent ?Transfers/ambulation related to ADLs: ?Eating: difficulty cutting ?Grooming: increased time ?UB Dressing: increased time - it is difficult ?LB Dressing: min assist ?Toileting: increased time ?Bathing: increased time ?Tub Shower transfers: uses grab bar to step over tub ?Equipment: Shower seat without back and Grab bars ? ? ?IADLs: ?Light housekeeping: yes ?Meal Prep:  simple meals ?Community mobility: drives self ? ? ?POSTURE COMMENTS:  ?No Significant postural limitations - back pain ? ? ? ? ?UE ROM    ? ?Active ROM LEFT ?07/31/2021 [facility] ?07/31/2021  ?Shoulder flexion 30 120  ?Shoulder abduction 30 100  ?Shoulder adduction    ?Shoulder extension NT   ?Shoulder internal rotation    ?Shoulder external rotation    ?Elbow flexion [facility] WFL  ?Elbow extension [facility] WFL  ?Wrist flexion [facility] WFL  ?Wrist extension [facility] WFL  ?Wrist ulnar deviation    ?Wrist radial deviation    ?Wrist pronation [facility] WFL  ?Wrist supination [facility]  ?(Blank rows = not tested) ? ? ? ?HAND FUNCTION: ?Grip strength: Right: 45.1 lbs; Left: 21.3 lbs - increase in pain in left shoulder with grip strength testing ? ? ?SENSATION: ?WFL ? ? ?PATIENT EDUCATION: ?Education details: results of OT evaluation and potential goals ?Person educated: Patient ?Education method: Explanation ?Education comprehension: needs further education ? ? ?HOME EXERCISE PROGRAM: ?Not yet issued ? ? ? ?GOALS: ?Goals reviewed with patient? No ? ?SHORT TERM GOALS: Target date: 08/28/2021 ? ?Patient will complete an HEP designed to improve shoulder range of motion ? ?Goal status: INITIAL ? ?2.  Patient will utilize left UE to assist with bathing self within restrictions ? ?Goal status: INITIAL ? ?3.  Patient will report pain no greater than 3/10 when using left hand to assist with bathing and dressing tasks ? ?Goal status: INITIAL ? ?4.  Patient will demonstrate understanding of precautions relating to R-TSR ? ?Goal status: INITIAL ? ? ?LONG TERM GOALS: Target date: 09/25/2021 ? ?Patient will complete updated HEP ? ?Goal status: INITIAL ? ?2.  Patient will demonstrate 5 lb increase in left grip strength ? ?Goal status: INITIAL ? ?3.  Patient will report pain no greater than 1-2/10 when functional using LUE during ADL/IADL activities ? ?Goal status: INITIAL ? ?4.  Patient will demonstrate at least 100 degrees of shoulder flexion in LUE ? ?Goal status:  INITIAL ? ? ?ASSESSMENT: ? ?CLINICAL IMPRESSION: ?Patient is a 71 y.o. femal who was seen today for occupational therapy evaluation following 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   ? ?PERFORMANCE DEFICITS in functional skills including coordination, sensation, edema, ROM, strength, pain, and muscle spasms ? ?IMPAIRMENTS are limiting patient from ADLs, IADLs, and rest and sleep.  ? ?COMORBIDITIES may have co-morbidities  that affects occupational performance. Patient will benefit from skilled OT to address above impairments and improve overall function. ? ?MODIFICATION OR ASSISTANCE TO COMPLETE EVALUATION: No modification of tasks or assist necessary to complete an evaluation. ? ?OT OCCUPATIONAL PROFILE AND HISTORY: Problem focused assessment: Including review of records relating to presenting problem. ? ?CLINICAL DECISION MAKING: LOW - limited treatment options, no task modification necessary ? ?REHAB POTENTIAL: Good ? ?EVALUATION COMPLEXITY: Moderate ? ? ? ?PLAN: ?OT FREQUENCY:  1x/week x 2 weeks, then 2x/week x 5 weeks ? ?OT DURATION: 8 weeks ? ?PLANNED  INTERVENTIONS: self care/ADL training, therapeutic exercise, therapeutic activity, neuromuscular re-education, manual therapy, scar mobilization, passive range of motion, aquatic therapy, ultrasound, moist heat, cryotherapy, patient/family education, and DME and/or AE instructions ? ?RECOMMENDED OTHER SERVICES: NA ? ?CONSULTED AND AGREED WITH PLAN OF CARE: Patient ? ?PLAN FOR NEXT SESSION: Review precautions/restrictions and start HEP ? ? ?Mariah Milling, OT ?07/31/2021, 6:10 PM ? ? ? ? ? ? ? ?  ? ?

## 2021-08-08 ENCOUNTER — Ambulatory Visit: Payer: Medicare HMO | Admitting: Occupational Therapy

## 2021-08-08 DIAGNOSIS — M25612 Stiffness of left shoulder, not elsewhere classified: Secondary | ICD-10-CM | POA: Diagnosis not present

## 2021-08-08 DIAGNOSIS — M6281 Muscle weakness (generalized): Secondary | ICD-10-CM

## 2021-08-08 DIAGNOSIS — G8929 Other chronic pain: Secondary | ICD-10-CM

## 2021-08-08 NOTE — Therapy (Signed)
?OUTPATIENT OCCUPATIONAL THERAPY TREATMENT NOTE ? ? ?Patient Name: Melanie Reynolds ?MRN: [id number] ?DOB:10-08-1950, 71 y.o., female ?Today's Date: 08/09/2021 ? ?PCP: Margretta Sidle, MD ?REFERRING PROVIDER: Sheryle Hail PA-C ? ?END OF SESSION:  ? OT End of Session - 08/09/21 1247   ? ? Visit Number 2   ? Number of Visits 13   ? Authorization Type [employer] Medicare and Traditional Medicaid   ? Behavior During Therapy [facility] for tasks assessed/performed   ? ?  ?  ? ?  ? ? ?Past Medical History:  ?Diagnosis Date  ? Allergy   ? Anxiety   ? Arthritis   ? "knees, legs, back" (06/01/2013)  ? Bipolar depression ([place])   ? Cataract   ? Chronic lower back pain   ? Constipation, chronic   ? COPD (chronic obstructive pulmonary disease) ([place])   ? Depression   ? High cholesterol   ? Irritable bowel syndrome (IBS)   ? OSA (obstructive sleep apnea)   ? Osteoporosis   ? Pneumonia 1970's; 1980's; 06/01/2013  ? "total of 3 times"  ? Sleep apnea   ? cpap  ? Type II diabetes mellitus ([place])   ? ?Past Surgical History:  ?Procedure Laterality Date  ? ABDOMINAL HYSTERECTOMY  1982  ? BACK SURGERY    ? CARPAL TUNNEL RELEASE    ? right wrist- with a nerve block placed January 2020  ? CHOLECYSTECTOMY  1990's  ? COLONOSCOPY    ? FOREARM FRACTURE SURGERY Left 1999  ? "put in plates"  ? IMPLANTATION VAGAL NERVE STIMULATOR  2013  ? POSTERIOR LUMBAR FUSION  2000's  ? REVERSE SHOULDER ARTHROPLASTY Left 06/14/2021  ? Procedure: REVERSE SHOULDER ARTHROPLASTY;  Surgeon: Tania Ade, MD;  Location: [facility];  Service: Orthopedics;  Laterality: Left;  ? SHOULDER OPEN ROTATOR CUFF REPAIR Right 2000  ? TUBAL LIGATION  1978  ? VAGAL NERVE STIMULATOR REMOVAL  2013  ? "got an infection in there" (06/01/2013)  ? ?Patient Active Problem List  ? Diagnosis Date Noted  ? Preoperative clearance 05/22/2021  ? Acute bronchitis 01/25/2020  ? 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 Allergic rhinitis 01/16/2017  ? Legionella pneumonia ([place]) 01/09/2016  ? Sepsis ([place]) 01/04/2016  ? AKI  (acute kidney injury) ([place]) 01/04/2016  ? Exertional dyspnea 08/29/2015  ? Tobacco user 08/29/2015  ? Obesity 08/29/2015  ? OSA (obstructive sleep apnea) 08/13/2013  ? CAP (community acquired pneumonia) 06/01/2013  ? Chest pain, musculoskeletal 06/01/2013  ? Bipolar 1 disorder ([place]) 06/01/2013  ? Hypotension 06/01/2013  ? Chronic pain 06/01/2013  ? Diabetes ([place]) 06/01/2013  ? ? ?ONSET DATE: 06/14/21 ? 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   ? ?THERAPY DIAG:  ?Stiffness of left shoulder, not elsewhere classified ? ?Chronic left shoulder pain ? ?Muscle weakness (generalized) ? ? 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   ? ?PRECAUTIONS: follow reverse total shoulder protocol ? ?SUBJECTIVE: Pt reports shoulder pain today  ? ?PAIN:  ?Are you having pain? Yes: NPRS scale: 4-5/10 ?Pain location: shoulder ?Pain description: aching ?Aggravating factors: movement ?Relieving factors: rest ? ? ? ? ? ? ?TODAY'S TREATMENT: ?Hotpack applied to left shoulder x 10 mins in supine with L arm supported on several pillows, no adverse reactions ?Pt was instructed in initial HEP for pendulums, table slides, elbow flexion/ extension and pt also performed wrist and finger flexion/ extension.  ?Pt was instructed to avoid reaching behind back as if tucking in her  shirt. ?Pt demonstrates significant pain today. ? ? ? ? ? ? ? ?  ? ?  ? ? ? ? ? ?  PATIENT EDUCATION: ?Education details: initial HEP, see pt instructions ?Person educated: Patient ?Education method: Explanation, demonstration, verbal cues ?Education comprehension: returned demonstration, verbal cues ?  ?  ?HOME EXERCISE PROGRAM: ?Not yet issued ?  ?  ?  ?GOALS: ?Goals reviewed with patient? No ?  ?SHORT TERM GOALS: Target date: 08/28/2021 ?  ?Patient will complete an HEP designed to improve shoulder range of motion ?  ?Goal status: INITIAL ?  ?2.  Patient will utilize left UE to assist with bathing self within restrictions ?  ?Goal status:  INITIAL ?  ?3.  Patient will report pain no greater than 3/10 when using left hand to assist with bathing and dressing tasks ?  ?Goal status: INITIAL ?  ?4.  Patient will demonstrate understanding of precautions relating to R-TSR ?  ?Goal status: INITIAL ?  ?  ?LONG TERM GOALS: Target date: 09/25/2021 ?  ?Patient will complete updated HEP ?  ?Goal status: INITIAL ?  ?2.  Patient will demonstrate 5 lb increase in left grip strength ?  ?Goal status: INITIAL ?  ?3.  Patient will report pain no greater than 1-2/10 when functional using LUE during ADL/IADL activities ?  ?Goal status: INITIAL ?  ?4.  Patient will demonstrate at least 100 degrees of shoulder flexion in LUE ?  ?Goal status: INITIAL ?  ?  ?ASSESSMENT: ?  ?CLINICAL IMPRESSION: ?Pt is progressing towards goals. She is limited by pain ?  ?PERFORMANCE DEFICITS in functional skills including coordination, sensation, edema, ROM, strength, pain, and muscle spasms ?  ?IMPAIRMENTS are limiting patient from ADLs, IADLs, and rest and sleep.  ?  ?COMORBIDITIES may have co-morbidities  that affects occupational performance. Patient will benefit from skilled OT to address above impairments and improve overall function. ?  ?MODIFICATION OR ASSISTANCE TO COMPLETE EVALUATION: No modification of tasks or assist necessary to complete an evaluation. ?  ?OT OCCUPATIONAL PROFILE AND HISTORY: Problem focused assessment: Including review of records relating to presenting problem. ?  ?CLINICAL DECISION MAKING: LOW - limited treatment options, no task modification necessary ?  ?REHAB POTENTIAL: Good ?  ?EVALUATION COMPLEXITY: Moderate ?  ?  ?PLAN: ?OT FREQUENCY:  1x/week x 2 weeks, then 2x/week x 5 weeks ?  ?OT DURATION: 8 weeks ?  ?PLANNED INTERVENTIONS: self care/ADL training, therapeutic exercise, therapeutic activity, neuromuscular re-education, manual therapy, scar mobilization, passive range of motion, aquatic therapy, ultrasound, moist heat, cryotherapy, patient/family  education, and DME and/or AE instructions ?  ?RECOMMENDED OTHER SERVICES: NA ?  ?CONSULTED AND AGREED WITH PLAN OF CARE: Patient ?  ?PLAN FOR NEXT SESSION: Review initial HEP, and progress slowly per protocol ? ? ? ? ? ?Theone Murdoch, OTR/L ?[phone] ?Phone: [phone] ?12:54 PM 08/09/21  ?

## 2021-08-08 NOTE — Patient Instructions (Signed)
Avoid reaching behind your back as if to tuck in your shirt with left arm  ? ?Pendulum Circular ? ? ? ?Bend forward 90? at waist, leaning on table for support. Rock body in a circular pattern to move arm clockwise _5___ times then counterclockwise __5__ times. ?Do ___2-3_ sessions per day. ? ?Copyright ? VHI. All rights reserved.  ? ?Pendulum Forward/Back ? ? ? ?Bend forward 90? at waist, using table for support. Rock body forward and back to swing arm. ?Repeat _10___ times. Do __2-3__ sessions per day. ? ?Copyright ? VHI. All rights reserved.  ?Pendulum Side to Side ? ? ? ?Bend forward 90? at waist, leaning on table for support. Rock body from side to side and let arm swing freely. ?Repeat __10__ times. Do _2-3___ sessions per day. ? ?Copyright ? VHI. All rights reserved.  ? ?SHOULDER: Flexion On Table ? ? ? ?Place hands on table, elbows straight. Slide towel forwards and backwards Hold __5_ seconds. __10_ reps per set, __3_ sets per day, _7__ days per week ? ? ?Copyright ? VHI. All rights reserved.  ? ?AROM: Elbow Flexion / Extension ? ? ? Gently bend elbow as far as possible. Then straighten arm as far as possible. ?Repeat _10-15___ times per set. Do _3__ sessions per day. ? ? ? ? ?  ?

## 2021-08-13 ENCOUNTER — Other Ambulatory Visit: Payer: Self-pay | Admitting: Internal Medicine

## 2021-08-13 DIAGNOSIS — Z720 Tobacco use: Secondary | ICD-10-CM

## 2021-08-15 ENCOUNTER — Ambulatory Visit: Payer: Medicare HMO | Admitting: Occupational Therapy

## 2021-08-15 ENCOUNTER — Encounter: Payer: Self-pay | Admitting: Occupational Therapy

## 2021-08-15 DIAGNOSIS — M25612 Stiffness of left shoulder, not elsewhere classified: Secondary | ICD-10-CM

## 2021-08-15 DIAGNOSIS — M6281 Muscle weakness (generalized): Secondary | ICD-10-CM

## 2021-08-15 DIAGNOSIS — G8929 Other chronic pain: Secondary | ICD-10-CM

## 2021-08-15 NOTE — Telephone Encounter (Signed)
Dr. Annamaria Boots, please advise on refill request. ? ?Allergies  ?Allergen Reactions  ? Morphine And Related Swelling and Rash  ? Latex Itching  ? Naproxen Other (See Comments)  ?  Abdominal pain  ? ? ? ?Current Outpatient Medications:  ?  ACCU-CHEK AVIVA PLUS test strip, daily. for testing, Disp: , Rfl: 5 ?  ACCU-CHEK SOFTCLIX LANCETS lancets, daily. for testing, Disp: , Rfl: 5 ?  albuterol (VENTOLIN HFA) 108 (90 Base) MCG/ACT inhaler, Inhale 2 puffs into the lungs every 6 (six) hours as needed for wheezing. , Disp: , Rfl:  ?  aspirin EC 81 MG tablet, Take 1 tablet (81 mg total) by mouth daily. Swallow whole., Disp: 20 tablet, Rfl: 0 ?  clobetasol ointment (TEMOVATE) 8.67 %, Apply 1 application topically as needed (eczema)., Disp: , Rfl: 5 ?  diclofenac sodium (VOLTAREN) 1 % GEL, Apply 4 g topically 4 (four) times daily as needed (for pain). , Disp: , Rfl: 5 ?  DULoxetine (CYMBALTA) 30 MG capsule, Take 30 mg by mouth at bedtime., Disp: , Rfl:  ?  fluticasone (FLONASE) 50 MCG/ACT nasal spray, Place 2 sprays into both nostrils daily. (Patient taking differently: Place 2 sprays into both nostrils as needed for allergies.), Disp: 3 g, Rfl: 2 ?  furosemide (LASIX) 40 MG tablet, Take 40 mg by mouth daily. (Patient not taking: Reported on 07/31/2021), Disp: , Rfl:  ?  lithium carbonate 300 MG capsule, Take 300 mg by mouth daily., Disp: , Rfl:  ?  LYRICA 150 MG capsule, Take 150 mg by mouth 3 (three) times daily., Disp: , Rfl: 5 ?  NARCAN 4 MG/0.1ML LIQD nasal spray kit, Place 1 spray into the nose once., Disp: , Rfl:  ?  NON FORMULARY, Place 1 Dose under the tongue 2 (two) times daily. CBD liquid, Disp: , Rfl:  ?  NON FORMULARY, Apply 1 application topically daily as needed (pain). CBD topical balm, Disp: , Rfl:  ?  oxyCODONE-acetaminophen (PERCOCET) 10-325 MG tablet, Take 1 tablet by mouth every 4 (four) hours as needed for pain., Disp: 30 tablet, Rfl: 0 ?  potassium chloride SA (K-DUR,KLOR-CON) 20 MEQ tablet, Take 20 mEq by  mouth daily., Disp: , Rfl:  ?  Semaglutide,0.25 or 0.5MG/DOS, (OZEMPIC, 0.25 OR 0.5 MG/DOSE,) 2 MG/1.5ML SOPN, Inject 0.5 mg into the skin every Wednesday., Disp: , Rfl:  ?  simvastatin (ZOCOR) 40 MG tablet, Take 1 tablet by mouth at bedtime., Disp: , Rfl:  ?  Tetrahydrozoline HCl (VISINE OP), Place 1 drop into both eyes daily as needed (redness)., Disp: , Rfl:  ?  tiZANidine (ZANAFLEX) 4 MG tablet, Take 1 tablet (4 mg total) by mouth every 8 (eight) hours as needed for muscle spasms., Disp: 30 tablet, Rfl: 0 ?  topiramate (TOPAMAX) 50 MG tablet, Take 50 mg by mouth daily. , Disp: , Rfl:  ?  Vitamin D, Ergocalciferol, (DRISDOL) 1.25 MG (50000 UNIT) CAPS capsule, Take 50,000 Units by mouth once a week., Disp: , Rfl:  ? ?

## 2021-08-15 NOTE — Therapy (Signed)
?OUTPATIENT OCCUPATIONAL THERAPY TREATMENT NOTE ? ? ?Patient Name: Melanie Reynolds ?MRN: [id number] ?DOB:05-18-1950, 71 y.o., female ?Today's Date: 08/15/2021 ? ?PCP: Margretta Sidle, MD ?REFERRING PROVIDER: Sheryle Hail PA-C ? ?END OF SESSION:  ? OT End of Session - 08/15/21 1102   ? ? Visit Number 3   ? Number of Visits 13   ? Authorization Type [employer] Medicare and Traditional Medicaid   ? OT Start Time 1024   ? OT Stop Time 1106   ? OT Time Calculation (min) 42 min   ? Activity Tolerance Patient limited by pain   ? Behavior During Therapy [facility] for tasks assessed/performed   ? ?  ?  ? ?  ? ? ?Past Medical History:  ?Diagnosis Date  ? Allergy   ? Anxiety   ? Arthritis   ? "knees, legs, back" (06/01/2013)  ? Bipolar depression ([place])   ? Cataract   ? Chronic lower back pain   ? Constipation, chronic   ? COPD (chronic obstructive pulmonary disease) ([place])   ? Depression   ? High cholesterol   ? Irritable bowel syndrome (IBS)   ? OSA (obstructive sleep apnea)   ? Osteoporosis   ? Pneumonia 1970's; 1980's; 06/01/2013  ? "total of 3 times"  ? Sleep apnea   ? cpap  ? Type II diabetes mellitus ([place])   ? ?Past Surgical History:  ?Procedure Laterality Date  ? ABDOMINAL HYSTERECTOMY  1982  ? BACK SURGERY    ? CARPAL TUNNEL RELEASE    ? right wrist- with a nerve block placed January 2020  ? CHOLECYSTECTOMY  1990's  ? COLONOSCOPY    ? FOREARM FRACTURE SURGERY Left 1999  ? "put in plates"  ? IMPLANTATION VAGAL NERVE STIMULATOR  2013  ? POSTERIOR LUMBAR FUSION  2000's  ? REVERSE SHOULDER ARTHROPLASTY Left 06/14/2021  ? Procedure: REVERSE SHOULDER ARTHROPLASTY;  Surgeon: Tania Ade, MD;  Location: [facility];  Service: Orthopedics;  Laterality: Left;  ? SHOULDER OPEN ROTATOR CUFF REPAIR Right 2000  ? TUBAL LIGATION  1978  ? VAGAL NERVE STIMULATOR REMOVAL  2013  ? "got an infection in there" (06/01/2013)  ? ?Patient Active Problem List  ? Diagnosis Date Noted  ? Preoperative clearance 05/22/2021  ? Acute bronchitis 01/25/2020  ? Splenic  infarction 12/12/2018  ? Allergic rhinitis 01/16/2017  ? Legionella pneumonia ([place]) 01/09/2016  ? Sepsis ([place]) 01/04/2016  ? AKI (acute kidney injury) ([place]) 01/04/2016  ? Exertional dyspnea 08/29/2015  ? Tobacco user 08/29/2015  ? Obesity 08/29/2015  ? OSA (obstructive sleep apnea) 08/13/2013  ? CAP (community acquired pneumonia) 06/01/2013  ? Chest pain, musculoskeletal 06/01/2013  ? Bipolar 1 disorder ([place]) 06/01/2013  ? Hypotension 06/01/2013  ? Chronic pain 06/01/2013  ? Diabetes ([place]) 06/01/2013  ? ? ?ONSET DATE: 06/14/21 ? 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   ? ?THERAPY DIAG:  ?Stiffness of left shoulder, not elsewhere classified ? ?Chronic left shoulder pain ? ?Muscle weakness (generalized) ? ? 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   ? ?PRECAUTIONS: follow reverse total shoulder protocol ? ?SUBJECTIVE: Pt reports shoulder pain today  ? ?PAIN:  ?Are you having pain? Yes: NPRS scale: 6-7/10 ?Pain location: shoulder ?Pain description: aching ?Aggravating factors: movement ?Relieving factors: rest ? ? ? ? ? ? ?TODAY'S TREATMENT: ?08/15/21 ?Hotpack applied to left shoulder x 10 mins in reclined position with L arm supported on several pillows, no adverse reactions ?Reviewed table slides - patient uanble to do penduluum exercises without painmanaging elbow  flexion and extension well ?PROM to left shoulder ER- 35 degrees, abduction 80, flexion 100  ?Gripper on lowest seeting with elbow on table - and encouraging slight ER of shoulder x 10 blocks.   ?Pt demonstrates significant improvement in pain at end if session. ? ? ? ?Prior session: ?Hotpack applied to left shoulder x 10 mins in supine with L arm supported on several pillows, no adverse reactions ?Pt was instructed in initial HEP for pendulums, table slides, elbow flexion/ extension and pt also performed wrist and finger flexion/ extension.  ?Pt was instructed to avoid reaching behind back as if tucking in her   shirt. ?Pt demonstrates significant pain today. ? ? ? ? ? ? ? ?  ? ?  ? ? ? ? ? ?PATIENT EDUCATION: ?Education details: initial HEP, see pt instructions ?Person educated: Patient ?Education method: Explanation, demonstration, verbal cues ?Education comprehension: returned demonstration, verbal cues ?  ?  ?HOME EXERCISE PROGRAM: ?Not yet issued ?  ?  ?  ?GOALS: ?Goals reviewed with patient? No ?  ?SHORT TERM GOALS: Target date: 08/28/2021 ?  ?Patient will complete an HEP designed to improve shoulder range of motion ?  ?Goal status: INITIAL ?  ?2.  Patient will utilize left UE to assist with bathing self within restrictions ?  ?Goal status: INITIAL ?  ?3.  Patient will report pain no greater than 3/10 when using left hand to assist with bathing and dressing tasks ?  ?Goal status: INITIAL ?  ?4.  Patient will demonstrate understanding of precautions relating to R-TSR ?  ?Goal status: INITIAL ?  ?  ?LONG TERM GOALS: Target date: 09/25/2021 ?  ?Patient will complete updated HEP ?  ?Goal status: INITIAL ?  ?2.  Patient will demonstrate 5 lb increase in left grip strength ?  ?Goal status: INITIAL ?  ?3.  Patient will report pain no greater than 1-2/10 when functional using LUE during ADL/IADL activities ?  ?Goal status: INITIAL ?  ?4.  Patient will demonstrate at least 100 degrees of shoulder flexion in LUE ?  ?Goal status: INITIAL ?  ?  ?ASSESSMENT: ?  ?CLINICAL IMPRESSION: ?Pt is progressing towards goals. She is limited by pain ?  ?PERFORMANCE DEFICITS in functional skills including coordination, sensation, edema, ROM, strength, pain, and muscle spasms ?  ?IMPAIRMENTS are limiting patient from ADLs, IADLs, and rest and sleep.  ?  ?COMORBIDITIES may have co-morbidities  that affects occupational performance. Patient will benefit from skilled OT to address above impairments and improve overall function. ?  ?MODIFICATION OR ASSISTANCE TO COMPLETE EVALUATION: No modification of tasks or assist necessary to complete an  evaluation. ?  ?OT OCCUPATIONAL PROFILE AND HISTORY: Problem focused assessment: Including review of records relating to presenting problem. ?  ?CLINICAL DECISION MAKING: LOW - limited treatment options, no task modification necessary ?  ?REHAB POTENTIAL: Good ?  ?EVALUATION COMPLEXITY: Moderate ?  ?  ?PLAN: ?OT FREQUENCY:  1x/week x 2 weeks, then 2x/week x 5 weeks ?  ?OT DURATION: 8 weeks ?  ?PLANNED INTERVENTIONS: self care/ADL training, therapeutic exercise, therapeutic activity, neuromuscular re-education, manual therapy, scar mobilization, passive range of motion, aquatic therapy, ultrasound, moist heat, cryotherapy, patient/family education, and DME and/or AE instructions ?  ?RECOMMENDED OTHER SERVICES: NA ?  ?CONSULTED AND AGREED WITH PLAN OF CARE: Patient ?  ?PLAN FOR NEXT SESSION: and progress slowly per protocol - Increase 45 ER, 115 FLEX, 90 ABD START ISOMETRIC STRENGTHENING ? ? ? ? ? ?Antony Salmon, OTR/L ?08/15/21 11:39  AM ?Phone: [phone] ?Fax: [phone] ? ? ?11:39 AM 08/15/21  ?

## 2021-08-15 NOTE — Telephone Encounter (Signed)
Nicoderm refilled ?

## 2021-08-21 ENCOUNTER — Encounter: Payer: Self-pay | Admitting: Occupational Therapy

## 2021-08-21 ENCOUNTER — Ambulatory Visit: Payer: Medicare HMO | Admitting: Occupational Therapy

## 2021-08-21 DIAGNOSIS — M25612 Stiffness of left shoulder, not elsewhere classified: Secondary | ICD-10-CM

## 2021-08-21 DIAGNOSIS — G8929 Other chronic pain: Secondary | ICD-10-CM

## 2021-08-21 DIAGNOSIS — M6281 Muscle weakness (generalized): Secondary | ICD-10-CM

## 2021-08-21 NOTE — Therapy (Signed)
?OUTPATIENT OCCUPATIONAL THERAPY TREATMENT NOTE ? ? ?Patient Name: Melanie Reynolds ?MRN: [id number] ?DOB:11-26-1950, 71 y.o., female ?Today's Date: 08/21/2021 ? ?PCP: Margretta Sidle, MD ?REFERRING PROVIDER: Sheryle Hail PA-C ? ?END OF SESSION:  ? OT End of Session - 08/21/21 1327   ? ? Visit Number 4   ? Number of Visits 13   ? Authorization Type [employer] Medicare and Traditional Medicaid   ? OT Start Time 1020   ? OT Stop Time 1100   ? OT Time Calculation (min) 40 min   ? Activity Tolerance Patient limited by pain   ? Behavior During Therapy [facility] for tasks assessed/performed   ? ?  ?  ? ?  ? ? ?Past Medical History:  ?Diagnosis Date  ? Allergy   ? Anxiety   ? Arthritis   ? "knees, legs, back" (06/01/2013)  ? Bipolar depression ([place])   ? Cataract   ? Chronic lower back pain   ? Constipation, chronic   ? COPD (chronic obstructive pulmonary disease) ([place])   ? Depression   ? High cholesterol   ? Irritable bowel syndrome (IBS)   ? OSA (obstructive sleep apnea)   ? Osteoporosis   ? Pneumonia 1970's; 1980's; 06/01/2013  ? "total of 3 times"  ? Sleep apnea   ? cpap  ? Type II diabetes mellitus ([place])   ? ?Past Surgical History:  ?Procedure Laterality Date  ? ABDOMINAL HYSTERECTOMY  1982  ? BACK SURGERY    ? CARPAL TUNNEL RELEASE    ? right wrist- with a nerve block placed January 2020  ? CHOLECYSTECTOMY  1990's  ? COLONOSCOPY    ? FOREARM FRACTURE SURGERY Left 1999  ? "put in plates"  ? IMPLANTATION VAGAL NERVE STIMULATOR  2013  ? POSTERIOR LUMBAR FUSION  2000's  ? REVERSE SHOULDER ARTHROPLASTY Left 06/14/2021  ? Procedure: REVERSE SHOULDER ARTHROPLASTY;  Surgeon: Tania Ade, MD;  Location: [facility];  Service: Orthopedics;  Laterality: Left;  ? SHOULDER OPEN ROTATOR CUFF REPAIR Right 2000  ? TUBAL LIGATION  1978  ? VAGAL NERVE STIMULATOR REMOVAL  2013  ? "got an infection in there" (06/01/2013)  ? ?Patient Active Problem List  ? Diagnosis Date Noted  ? Preoperative clearance 05/22/2021  ? Acute bronchitis 01/25/2020  ? Splenic  infarction 12/12/2018  ? Allergic rhinitis 01/16/2017  ? Legionella pneumonia ([place]) 01/09/2016  ? Sepsis ([place]) 01/04/2016  ? AKI (acute kidney injury) ([place]) 01/04/2016  ? Exertional dyspnea 08/29/2015  ? Tobacco user 08/29/2015  ? Obesity 08/29/2015  ? OSA (obstructive sleep apnea) 08/13/2013  ? CAP (community acquired pneumonia) 06/01/2013  ? Chest pain, musculoskeletal 06/01/2013  ? Bipolar 1 disorder ([place]) 06/01/2013  ? Hypotension 06/01/2013  ? Chronic pain 06/01/2013  ? Diabetes ([place]) 06/01/2013  ? ? ?ONSET DATE: 06/14/21 ? 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   ? ?THERAPY DIAG:  ?Stiffness of left shoulder, not elsewhere classified ? ?Chronic left shoulder pain ? ?Muscle weakness (generalized) ? ? 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   ? ?PRECAUTIONS: follow reverse total shoulder protocol ? ?SUBJECTIVE: Pt reports shoulder pain today  ? ?PAIN:  ?Are you having pain? Yes: NPRS scale: 0/10 upon arrival, but 8-9/10 when transitioned to supine ?Pain location: shoulder ?Pain description: aching ?Aggravating factors: movement ?Relieving factors: rest ? ? ? ? ? ? ?TODAY'S TREATMENT: ?08/21/21 ?Patient arrived without pain in left shoulder, however when transitioned to supine.  Pain significant - 8-9/10, moist heat applied.   ?Patient with significant improvement  in ROM.   ?PROM to left shoulder ER- 40 degrees, abduction 90, flexion 120.  ?In sitting - active shoulder scaption to 90 degrees ?Isometric strengthening shoulder flex, abd, ER, elbow flex/ext.   ?Patient reports overall improved functional use, motion and pain.   ? ? ?08/15/21 ?Hotpack applied to left shoulder x 10 mins in reclined position with L arm supported on several pillows, no adverse reactions ?Reviewed table slides - patient uanble to do penduluum exercises without painmanaging elbow flexion and extension well ?PROM to left shoulder ER- 35 degrees, abduction 80, flexion 100  ?Gripper on lowest  seeting with elbow on table - and encouraging slight ER of shoulder x 10 blocks.   ?Pt demonstrates significant improvement in pain at end if session. ? ? ? ? ? ? ? ? ? ? ?  ? ?  ? ? ? ? ? ?PATIENT EDUCATION: ?Education details: initial HEP, see pt instructions ?Person educated: Patient ?Education method: Explanation, demonstration, verbal cues ?Education comprehension: returned demonstration, verbal cues ?  ?  ?HOME EXERCISE PROGRAM: ?Not yet issued ?  ?  ?  ?GOALS: ?Goals reviewed with patient? No ?  ?SHORT TERM GOALS: Target date: 08/28/2021 ?  ?Patient will complete an HEP designed to improve shoulder range of motion ?  ?Goal status: INITIAL ?  ?2.  Patient will utilize left UE to assist with bathing self within restrictions ?  ?Goal status: INITIAL ?  ?3.  Patient will report pain no greater than 3/10 when using left hand to assist with bathing and dressing tasks ?  ?Goal status: INITIAL ?  ?4.  Patient will demonstrate understanding of precautions relating to R-TSR ?  ?Goal status: INITIAL ?  ?  ?LONG TERM GOALS: Target date: 09/25/2021 ?  ?Patient will complete updated HEP ?  ?Goal status: INITIAL ?  ?2.  Patient will demonstrate 5 lb increase in left grip strength ?  ?Goal status: INITIAL ?  ?3.  Patient will report pain no greater than 1-2/10 when functional using LUE during ADL/IADL activities ?  ?Goal status: INITIAL ?  ?4.  Patient will demonstrate at least 100 degrees of shoulder flexion in LUE ?  ?Goal status: INITIAL ?  ?  ?ASSESSMENT: ?  ?CLINICAL IMPRESSION: ?Pt is progressing towards goals. She is limited by pain ?  ?PERFORMANCE DEFICITS in functional skills including coordination, sensation, edema, ROM, strength, pain, and muscle spasms ?  ?IMPAIRMENTS are limiting patient from ADLs, IADLs, and rest and sleep.  ?  ?COMORBIDITIES may have co-morbidities  that affects occupational performance. Patient will benefit from skilled OT to address above impairments and improve overall function. ?   ?MODIFICATION OR ASSISTANCE TO COMPLETE EVALUATION: No modification of tasks or assist necessary to complete an evaluation. ?  ?OT OCCUPATIONAL PROFILE AND HISTORY: Problem focused assessment: Including review of records relating to presenting problem. ?  ?CLINICAL DECISION MAKING: LOW - limited treatment options, no task modification necessary ?  ?REHAB POTENTIAL: Good ?  ?EVALUATION COMPLEXITY: Moderate ?  ?  ?PLAN: ?OT FREQUENCY:  1x/week x 2 weeks, then 2x/week x 5 weeks ?  ?OT DURATION: 8 weeks ?  ?PLANNED INTERVENTIONS: self care/ADL training, therapeutic exercise, therapeutic activity, neuromuscular re-education, manual therapy, scar mobilization, passive range of motion, aquatic therapy, ultrasound, moist heat, cryotherapy, patient/family education, and DME and/or AE instructions ?  ?RECOMMENDED OTHER SERVICES: NA ?  ?CONSULTED AND AGREED WITH PLAN OF CARE: Patient ?  ?PLAN FOR NEXT SESSION: and progress slowly per protocol - Increase PROM/AROM  to tolerance isometric strengthening - could try light theraband flexion / abd- reach pattern, elbow flex/ext - continued grip strength work - maybe putty if time ? ? ? ? ? ?Antony Salmon, OTR/L ?08/21/21 1:29 PM ?Phone: [phone] ?Fax: [phone] ? ? ?1:29 PM 08/21/21  ?

## 2021-08-23 ENCOUNTER — Ambulatory Visit: Payer: Medicare HMO | Admitting: Occupational Therapy

## 2021-08-23 ENCOUNTER — Encounter: Payer: Self-pay | Admitting: Occupational Therapy

## 2021-08-23 DIAGNOSIS — M6281 Muscle weakness (generalized): Secondary | ICD-10-CM

## 2021-08-23 DIAGNOSIS — G8929 Other chronic pain: Secondary | ICD-10-CM

## 2021-08-23 DIAGNOSIS — M25612 Stiffness of left shoulder, not elsewhere classified: Secondary | ICD-10-CM

## 2021-08-23 NOTE — Therapy (Signed)
?OUTPATIENT OCCUPATIONAL THERAPY TREATMENT NOTE ? ? ?Patient Name: Melanie Reynolds ?MRN: [id number] ?DOB:[birth year], 71 y.o., female ?Today's Date: 08/23/2021 ? ?PCP: Margretta Sidle, MD ?REFERRING PROVIDER: Sheryle Hail PA-C ? ?END OF SESSION:  ? OT End of Session - 08/23/21 1357   ? ? Visit Number 5   ? Number of Visits 13   ? Authorization Type [employer] Medicare and Traditional Medicaid   ? OT Start Time 1103   ? OT Stop Time 1145   ? OT Time Calculation (min) 42 min   ? Activity Tolerance Patient limited by pain   ? Behavior During Therapy [facility] for tasks assessed/performed   ? ?  ?  ? ?  ? ? ?Past Medical History:  ?Diagnosis Date  ? Allergy   ? Anxiety   ? Arthritis   ? "knees, legs, back" (06/01/2013)  ? Bipolar depression ([place])   ? Cataract   ? Chronic lower back pain   ? Constipation, chronic   ? COPD (chronic obstructive pulmonary disease) ([place])   ? Depression   ? High cholesterol   ? Irritable bowel syndrome (IBS)   ? OSA (obstructive sleep apnea)   ? Osteoporosis   ? Pneumonia 1970's; 1980's; 06/01/2013  ? "total of 3 times"  ? Sleep apnea   ? cpap  ? Type II diabetes mellitus ([place])   ? ?Past Surgical History:  ?Procedure Laterality Date  ? ABDOMINAL HYSTERECTOMY  1982  ? BACK SURGERY    ? CARPAL TUNNEL RELEASE    ? right wrist- with a nerve block placed January 2020  ? CHOLECYSTECTOMY  1990's  ? COLONOSCOPY    ? FOREARM FRACTURE SURGERY Left 1999  ? "put in plates"  ? IMPLANTATION VAGAL NERVE STIMULATOR  2013  ? POSTERIOR LUMBAR FUSION  2000's  ? REVERSE SHOULDER ARTHROPLASTY Left 06/14/2021  ? Procedure: REVERSE SHOULDER ARTHROPLASTY;  Surgeon: Tania Ade, MD;  Location: [facility];  Service: Orthopedics;  Laterality: Left;  ? SHOULDER OPEN ROTATOR CUFF REPAIR Right 2000  ? TUBAL LIGATION  1978  ? VAGAL NERVE STIMULATOR REMOVAL  2013  ? "got an infection in there" (06/01/2013)  ? ?Patient Active Problem List  ? Diagnosis Date Noted  ? Preoperative clearance 05/22/2021  ? Acute bronchitis 01/25/2020  ? Splenic  infarction 12/12/2018  ? Allergic rhinitis 01/16/2017  ? Legionella pneumonia ([place]) 01/09/2016  ? Sepsis ([place]) 01/04/2016  ? AKI (acute kidney injury) ([place]) 01/04/2016  ? Exertional dyspnea 08/29/2015  ? Tobacco user 08/29/2015  ? Obesity 08/29/2015  ? OSA (obstructive sleep apnea) 08/13/2013  ? CAP (community acquired pneumonia) 06/01/2013  ? Chest pain, musculoskeletal 06/01/2013  ? Bipolar 1 disorder ([place]) 06/01/2013  ? Hypotension 06/01/2013  ? Chronic pain 06/01/2013  ? Diabetes ([place]) 06/01/2013  ? ? ?ONSET DATE: 06/14/21 ? 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   ? ?THERAPY DIAG:  ?Stiffness of left shoulder, not elsewhere classified ? ?Chronic left shoulder pain ? ?Muscle weakness (generalized) ? ? 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   ? ?PRECAUTIONS: follow reverse total shoulder protocol ? ?SUBJECTIVE: Pt reports shoulder pain today  ? ?PAIN:  ?Are you having pain? Yes: NPRS scale: 6/10 upon arrival ?Pain location: shoulder ?Pain description: aching ?Aggravating factors: movement ?Relieving factors: rest ? ? ? ? ? ? ?TODAY'S TREATMENT: ? ?   08/23/21: ?  Hotpack applied to left shoulder x 10 mins in reclined position with L arm supported on several pillows, no adverse reactions ?PROM to LUE.  Patient  very painful throughout session - did not use CBD rub prior to coming to session - rushed getting her jacket on and irritated her shoulder.   ?Patient assisted to sitting, and able to flex shoulder to 148 degrees.  Worked on mid to high assisted reaching with flexed elbow.   ?Practiced safe technique for donning front opening jacket to avoid left shoulder pain.  ? ? ?08/21/21 ?Patient arrived without pain in left shoulder, however when transitioned to supine.  Pain significant - 8-9/10, moist heat applied.   ?Patient with significant improvement in ROM.   ?PROM to left shoulder ER- 40 degrees, abduction 90, flexion 120.  ?In sitting - active shoulder scaption to  90 degrees ?Isometric strengthening shoulder flex, abd, ER, elbow flex/ext.   ?Patient reports overall improved functional use, motion and pain.   ? ? ?08/15/21 ?Hotpack applied to left shoulder x 10 mins in reclined position with L arm supported on several pillows, no adverse reactions ?Reviewed table slides - patient uanble to do penduluum exercises without painmanaging elbow flexion and extension well ?PROM to left shoulder ER- 35 degrees, abduction 80, flexion 100  ?Gripper on lowest seeting with elbow on table - and encouraging slight ER of shoulder x 10 blocks.   ?Pt demonstrates significant improvement in pain at end if session. ? ? ? ? ? ? ? ? ? ? ?  ? ?  ? ? ? ? ? ?PATIENT EDUCATION: ?Education details: initial HEP, see pt instructions ?Person educated: Patient ?Education method: Explanation, demonstration, verbal cues ?Education comprehension: returned demonstration, verbal cues ?  ?  ?HOME EXERCISE PROGRAM: ?Not yet issued ?  ?  ?  ?GOALS: ?Goals reviewed with patient? No ?  ?SHORT TERM GOALS: Target date: 08/28/2021 ?  ?Patient will complete an HEP designed to improve shoulder range of motion ?  ?Goal status: INITIAL ?  ?2.  Patient will utilize left UE to assist with bathing self within restrictions ?  ?Goal status: INITIAL ?  ?3.  Patient will report pain no greater than 3/10 when using left hand to assist with bathing and dressing tasks ?  ?Goal status: INITIAL ?  ?4.  Patient will demonstrate understanding of precautions relating to R-TSR ?  ?Goal status: INITIAL ?  ?  ?LONG TERM GOALS: Target date: 09/25/2021 ?  ?Patient will complete updated HEP ?  ?Goal status: INITIAL ?  ?2.  Patient will demonstrate 5 lb increase in left grip strength ?  ?Goal status: INITIAL ?  ?3.  Patient will report pain no greater than 1-2/10 when functional using LUE during ADL/IADL activities ?  ?Goal status: INITIAL ?  ?4.  Patient will demonstrate at least 100 degrees of shoulder flexion in LUE ?  ?Goal status: INITIAL ?   ?  ?ASSESSMENT: ?  ?CLINICAL IMPRESSION: ?Pt is progressing towards goals. She is limited by pain ?  ?PERFORMANCE DEFICITS in functional skills including coordination, sensation, edema, ROM, strength, pain, and muscle spasms ?  ?IMPAIRMENTS are limiting patient from ADLs, IADLs, and rest and sleep.  ?  ?COMORBIDITIES may have co-morbidities  that affects occupational performance. Patient will benefit from skilled OT to address above impairments and improve overall function. ?  ?MODIFICATION OR ASSISTANCE TO COMPLETE EVALUATION: No modification of tasks or assist necessary to complete an evaluation. ?  ?OT OCCUPATIONAL PROFILE AND HISTORY: Problem focused assessment: Including review of records relating to presenting problem. ?  ?CLINICAL DECISION MAKING: LOW - limited treatment options, no task modification necessary ?  ?  REHAB POTENTIAL: Good ?  ?EVALUATION COMPLEXITY: Moderate ?  ?  ?PLAN: ?OT FREQUENCY:  1x/week x 2 weeks, then 2x/week x 5 weeks ?  ?OT DURATION: 8 weeks ?  ?PLANNED INTERVENTIONS: self care/ADL training, therapeutic exercise, therapeutic activity, neuromuscular re-education, manual therapy, scar mobilization, passive range of motion, aquatic therapy, ultrasound, moist heat, cryotherapy, patient/family education, and DME and/or AE instructions ?  ?RECOMMENDED OTHER SERVICES: NA ?  ?CONSULTED AND AGREED WITH PLAN OF CARE: Patient ?  ?PLAN FOR NEXT SESSION:  progress slowly per protocol - Increase PROM/AROM to tolerance isometric strengthening - could try light theraband flexion / abd- reach pattern, elbow flex/ext - continued grip strength work - maybe putty if time ? ? ? ? ? ?Antony Salmon, OTR/L ?08/23/21 1:58 PM ?Phone: [phone] ?Fax: [phone] ? ? ?1:58 PM 08/23/21  ?

## 2021-08-28 ENCOUNTER — Ambulatory Visit: Payer: Medicare HMO | Attending: Surgical | Admitting: Occupational Therapy

## 2021-08-28 ENCOUNTER — Encounter: Payer: Self-pay | Admitting: Occupational Therapy

## 2021-08-28 DIAGNOSIS — M25612 Stiffness of left shoulder, not elsewhere classified: Secondary | ICD-10-CM | POA: Diagnosis present

## 2021-08-28 DIAGNOSIS — G8929 Other chronic pain: Secondary | ICD-10-CM | POA: Insufficient documentation

## 2021-08-28 DIAGNOSIS — M25512 Pain in left shoulder: Secondary | ICD-10-CM | POA: Diagnosis present

## 2021-08-28 DIAGNOSIS — R2681 Unsteadiness on feet: Secondary | ICD-10-CM | POA: Insufficient documentation

## 2021-08-28 DIAGNOSIS — M6281 Muscle weakness (generalized): Secondary | ICD-10-CM | POA: Insufficient documentation

## 2021-08-28 NOTE — Therapy (Signed)
?OUTPATIENT OCCUPATIONAL THERAPY TREATMENT NOTE ? ? ?Patient Name: Melanie Reynolds ?MRN: [id number] ?DOB:04/06/1951, 71 y.o., female ?Today's Date: 08/28/2021 ? ?PCP: Margretta Sidle, MD ?REFERRING PROVIDER: Sheryle Hail PA-C ? ?END OF SESSION:  ? OT End of Session - 08/28/21 1019   ? ? Visit Number 6   ? Number of Visits 13   ? Authorization Type [employer] Medicare and Traditional Medicaid   ? OT Start Time 1015   10 min heat  ? OT Stop Time 1108   ? OT Time Calculation (min) 53 min   ? Activity Tolerance Patient limited by pain   ? Behavior During Therapy [facility] for tasks assessed/performed   ? ?  ?  ? ?  ? ? ?Past Medical History:  ?Diagnosis Date  ? Allergy   ? Anxiety   ? Arthritis   ? "knees, legs, back" (06/01/2013)  ? Bipolar depression ([place])   ? Cataract   ? Chronic lower back pain   ? Constipation, chronic   ? COPD (chronic obstructive pulmonary disease) ([place])   ? Depression   ? High cholesterol   ? Irritable bowel syndrome (IBS)   ? OSA (obstructive sleep apnea)   ? Osteoporosis   ? Pneumonia 1970's; 1980's; 06/01/2013  ? "total of 3 times"  ? Sleep apnea   ? cpap  ? Type II diabetes mellitus ([place])   ? ?Past Surgical History:  ?Procedure Laterality Date  ? ABDOMINAL HYSTERECTOMY  1982  ? BACK SURGERY    ? CARPAL TUNNEL RELEASE    ? right wrist- with a nerve block placed January 2020  ? CHOLECYSTECTOMY  1990's  ? COLONOSCOPY    ? FOREARM FRACTURE SURGERY Left 1999  ? "put in plates"  ? IMPLANTATION VAGAL NERVE STIMULATOR  2013  ? POSTERIOR LUMBAR FUSION  2000's  ? REVERSE SHOULDER ARTHROPLASTY Left 06/14/2021  ? Procedure: REVERSE SHOULDER ARTHROPLASTY;  Surgeon: Tania Ade, MD;  Location: [facility];  Service: Orthopedics;  Laterality: Left;  ? SHOULDER OPEN ROTATOR CUFF REPAIR Right 2000  ? TUBAL LIGATION  1978  ? VAGAL NERVE STIMULATOR REMOVAL  2013  ? "got an infection in there" (06/01/2013)  ? ?Patient Active Problem List  ? Diagnosis Date Noted  ? Preoperative clearance 05/22/2021  ? Acute bronchitis 01/25/2020   ? 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 Allergic rhinitis 01/16/2017  ? Legionella pneumonia ([place]) 01/09/2016  ? Sepsis ([place]) 01/04/2016  ? AKI (acute kidney injury) ([place]) 01/04/2016  ? Exertional dyspnea 08/29/2015  ? Tobacco user 08/29/2015  ? Obesity 08/29/2015  ? OSA (obstructive sleep apnea) 08/13/2013  ? CAP (community acquired pneumonia) 06/01/2013  ? Chest pain, musculoskeletal 06/01/2013  ? Bipolar 1 disorder ([place]) 06/01/2013  ? Hypotension 06/01/2013  ? Chronic pain 06/01/2013  ? Diabetes ([place]) 06/01/2013  ? ? ?ONSET DATE: 06/14/21 ? 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   ? ?THERAPY DIAG:  ?Stiffness of left shoulder, not elsewhere classified ? ?Chronic left shoulder pain ? ?Muscle weakness (generalized) ? ? 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   ? ?PRECAUTIONS: follow reverse total shoulder protocol ? ?SUBJECTIVE: It's getting better   ? ?PAIN:  ?Are you having pain? Yes: NPRS scale: 6/10 upon arrival, up to 10/17 w/ certain ex's ?Pain location: shoulder ?Pain description: aching ?Aggravating factors: movement ?Relieving factors: rest, heat ? ? ? ? ? ? ?TODAY'S TREATMENT: ? ? ?08/28/21: Hotpack applied to left shoulder x 10 mins in reclined position with L arm supported on several pillows, no adverse  reactions.  ? ?Pt issued updates to shoulder HEP today and performed w/ modifications prn. In supine on wedge: pt able to perform chest press motion w/ dowel, pt required assist/facilitation LUE w/ sh flex when elbows straight.  ?Sidelying: worked on AA/ROM in gravity elim plane for sh flexion and extension to neutral along board. Pt has painful arc b/t approx 45* and 75*, but actually gets better higher.  ?Sidelying: with forearm supported on pillows, performed ER LUE.  ?Seated: AA/ROM BUE's in sh flex along diagonal surface for slight incline w/ assist from Rt hand. ?Standing: isometric strengthening against wall for sh abduction and ER, however discontinued  isometric ER for now due to pain up to 10/10. Pt could tolerate isometric abd w/o increase in pain ? ?PATIENT EDUCATION: ?Education details: updates to HEP ?Person educated: Patient ?Education method: Explanation, Demonstration, Verbal cues, and Handouts ?Education comprehension: verbalized understanding, returned demonstration, verbal cues required, and needs further education ? ?    ? ?08/23/21: ?  Hotpack applied to left shoulder x 10 mins in reclined position with L arm supported on several pillows, no adverse reactions ?PROM to LUE.  Patient very painful throughout session - did not use CBD rub prior to coming to session - rushed getting her jacket on and irritated her shoulder.   ?Patient assisted to sitting, and able to flex shoulder to 148 degrees.  Worked on mid to high assisted reaching with flexed elbow.   ?Practiced safe technique for donning front opening jacket to avoid left shoulder pain.  ? ? ?08/21/21 ?Patient arrived without pain in left shoulder, however when transitioned to supine.  Pain significant - 8-9/10, moist heat applied.   ?Patient with significant improvement in ROM.   ?PROM to left shoulder ER- 40 degrees, abduction 90, flexion 120.  ?In sitting - active shoulder scaption to 90 degrees ?Isometric strengthening shoulder flex, abd, ER, elbow flex/ext.   ?Patient reports overall improved functional use, motion and pain.   ? ? ?08/15/21 ?Hotpack applied to left shoulder x 10 mins in reclined position with L arm supported on several pillows, no adverse reactions ?Reviewed table slides - patient uanble to do penduluum exercises without painmanaging elbow flexion and extension well ?PROM to left shoulder ER- 35 degrees, abduction 80, flexion 100  ?Gripper on lowest seeting with elbow on table - and encouraging slight ER of shoulder x 10 blocks.   ?Pt demonstrates significant improvement in pain at end if session. ?  ? ? ?  ?HOME EXERCISE PROGRAM: ?08/28/21: Updates to HEP ?08/08/21: Initial HEP   ?  ?  ?GOALS: ?Goals reviewed with patient? No ?  ?SHORT TERM GOALS: Target date: 08/28/2021 ?  ?Patient will complete an HEP designed to improve shoulder range of motion ?  ?Goal status: ONGOING ?  ?2.  Patient will utilize left UE to assist with bathing self within restrictions ?  ?Goal status: INITIAL ?  ?3.  Patient will report pain no greater than 3/10 when using left hand to assist with bathing and dressing tasks ?  ?Goal status: INITIAL ?  ?4.  Patient will demonstrate understanding of precautions relating to R-TSR ?  ?Goal status: ONGOING ?  ?  ?LONG TERM GOALS: Target date: 09/25/2021 ?  ?Patient will complete updated HEP ?  ?Goal status: INITIAL ?  ?2.  Patient will demonstrate 5 lb increase in left grip strength ?  ?Goal status: INITIAL ?  ?3.  Patient will report pain no greater than 1-2/10 when functional using LUE  during ADL/IADL activities ?  ?Goal status: INITIAL ?  ?4.  Patient will demonstrate at least 100 degrees of shoulder flexion in LUE ?  ?Goal status: INITIAL ?  ?  ?ASSESSMENT: ?  ?CLINICAL IMPRESSION: ?Pt is progressing towards goals. She is limited by pain especially in painful arc ranges ?  ?PERFORMANCE DEFICITS in functional skills including coordination, sensation, edema, ROM, strength, pain, and muscle spasms ?  ?IMPAIRMENTS are limiting patient from ADLs, IADLs, and rest and sleep.  ?  ?COMORBIDITIES may have co-morbidities  that affects occupational performance. Patient will benefit from skilled OT to address above impairments and improve overall function. ?  ?MODIFICATION OR ASSISTANCE TO COMPLETE EVALUATION: No modification of tasks or assist necessary to complete an evaluation. ?  ?OT OCCUPATIONAL PROFILE AND HISTORY: Problem focused assessment: Including review of records relating to presenting problem. ?  ?CLINICAL DECISION MAKING: LOW - limited treatment options, no task modification necessary ?  ?REHAB POTENTIAL: Good ?  ?EVALUATION COMPLEXITY: Moderate ?  ?  ?PLAN: ?OT  FREQUENCY:  1x/week x 2 weeks, then 2x/week x 5 weeks ?  ?OT DURATION: 8 weeks ?  ?PLANNED INTERVENTIONS: self care/ADL training, therapeutic exercise, therapeutic activity, neuromuscular re-education, manual therap

## 2021-08-28 NOTE — Patient Instructions (Signed)
SHOULDER: Flexion - Supine (Cane) ? ? ? ?Hold cane in both hands. Raise arms up to tolerance. If unable, then can modify it to chest press motion (beginning at chest, and raising towards ceiling). MAKE SURE PILLOWS ARE UNDER LT ARM. Do not allow back to arch. Hold _3__ seconds. _10__ reps per set, __2_ sets per day ? ? ?Strengthening: Isometric Abduction ? ? ? ?Using wall for resistance, press left arm into wall using light pressure. Hold __10__ seconds. ?Repeat __5__ times per set.  Do __2__ sessions per day. ? ?External Rotation (Side-Lying) ? ? ? ?Lie on right side, keep elbow bent 90?, towel roll between arm and body, and pillows under forearm. Keeping elbow bent at side, rotate Lt forearm upward. ?Repeat 10__ times. Repeat with other hand for set. Do 2 sessions per day. NO BALL ? ?Strengthening: Isometric External Rotation ? ? ? ?Using wall to provide resistance, and keeping left arm at side, press back of hand into wall using light pressure. Hold __10__ seconds. ?Repeat __5__ times per set.  Do __2__ sessions per day. ? ? ? ?

## 2021-08-30 ENCOUNTER — Ambulatory Visit: Payer: Medicare HMO | Admitting: Occupational Therapy

## 2021-08-30 ENCOUNTER — Encounter: Payer: Self-pay | Admitting: Occupational Therapy

## 2021-08-30 DIAGNOSIS — M25612 Stiffness of left shoulder, not elsewhere classified: Secondary | ICD-10-CM | POA: Diagnosis not present

## 2021-08-30 NOTE — Therapy (Signed)
?OUTPATIENT OCCUPATIONAL THERAPY TREATMENT NOTE ? ? ?Patient Name: Melanie Reynolds ?MRN: [id number] ?DOB:07-25-1950, 71 y.o., female ?Today's Date: 08/30/2021 ? ?PCP: Margretta Sidle, MD ?REFERRING PROVIDER: Sheryle Hail PA-C ? ?END OF SESSION:  ? OT End of Session - 08/30/21 1023   ? ? Visit Number 7   ? Number of Visits 13   ? Authorization Type [employer] Medicare and Traditional Medicaid   ? OT Start Time 1018   ? OT Stop Time 1100   ? OT Time Calculation (min) 42 min   ? Activity Tolerance Patient limited by pain   ? Behavior During Therapy [facility] for tasks assessed/performed   ? ?  ?  ? ?  ? ? ?Past Medical History:  ?Diagnosis Date  ? Allergy   ? Anxiety   ? Arthritis   ? "knees, legs, back" (06/01/2013)  ? Bipolar depression ([place])   ? Cataract   ? Chronic lower back pain   ? Constipation, chronic   ? COPD (chronic obstructive pulmonary disease) ([place])   ? Depression   ? High cholesterol   ? Irritable bowel syndrome (IBS)   ? OSA (obstructive sleep apnea)   ? Osteoporosis   ? Pneumonia 1970's; 1980's; 06/01/2013  ? "total of 3 times"  ? Sleep apnea   ? cpap  ? Type II diabetes mellitus ([place])   ? ?Past Surgical History:  ?Procedure Laterality Date  ? ABDOMINAL HYSTERECTOMY  1982  ? BACK SURGERY    ? CARPAL TUNNEL RELEASE    ? right wrist- with a nerve block placed January 2020  ? CHOLECYSTECTOMY  1990's  ? COLONOSCOPY    ? FOREARM FRACTURE SURGERY Left 1999  ? "put in plates"  ? IMPLANTATION VAGAL NERVE STIMULATOR  2013  ? POSTERIOR LUMBAR FUSION  2000's  ? REVERSE SHOULDER ARTHROPLASTY Left 06/14/2021  ? Procedure: REVERSE SHOULDER ARTHROPLASTY;  Surgeon: Tania Ade, MD;  Location: [facility];  Service: Orthopedics;  Laterality: Left;  ? SHOULDER OPEN ROTATOR CUFF REPAIR Right 2000  ? TUBAL LIGATION  1978  ? VAGAL NERVE STIMULATOR REMOVAL  2013  ? "got an infection in there" (06/01/2013)  ? ?Patient Active Problem List  ? Diagnosis Date Noted  ? Preoperative clearance 05/22/2021  ? Acute bronchitis 01/25/2020  ? Splenic  infarction 12/12/2018  ? Allergic rhinitis 01/16/2017  ? Legionella pneumonia ([place]) 01/09/2016  ? Sepsis ([place]) 01/04/2016  ? AKI (acute kidney injury) ([place]) 01/04/2016  ? Exertional dyspnea 08/29/2015  ? Tobacco user 08/29/2015  ? Obesity 08/29/2015  ? OSA (obstructive sleep apnea) 08/13/2013  ? CAP (community acquired pneumonia) 06/01/2013  ? Chest pain, musculoskeletal 06/01/2013  ? Bipolar 1 disorder ([place]) 06/01/2013  ? Hypotension 06/01/2013  ? Chronic pain 06/01/2013  ? Diabetes ([place]) 06/01/2013  ? ? ?ONSET DATE: 06/14/21 ? 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   ? ?THERAPY DIAG:  ?Stiffness of left shoulder, not elsewhere classified ? ?Chronic left shoulder pain ? ? 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   ? ?PRECAUTIONS: follow reverse total shoulder protocol ? ?SUBJECTIVE: It's getting better   ? ?PAIN:  ?Are you having pain? Yes: NPRS scale: 5-6/10 upon arrival, up to 62/95 w/ certain ex's t/o session ?Pain location: shoulder ?Pain description: aching ?Aggravating factors: movement ?Relieving factors: rest, heat ? ? ? ? ? ? ?TODAY'S TREATMENT: ? ? 08/30/21: Hotpack applied to left shoulder x 10 mins in reclined position with L arm supported on several pillows, no adverse reactions.  ? ?Reviewed HEP from  08/28/21 w/ modifications prn. Pt had more pain today up to 10/10 t/o session. Pt unable to tolerate finger walking up shoulder ladder even with assist from RUE.  ?Issued red putty for grip strengthening Lt hand. Pt return demo ? ?PATIENT EDUCATION: ?Education details: putty HEP ?Person educated: Patient ?Education method: Explanation and Demonstration ?Education comprehension: verbalized understanding and returned demonstration ? ? ? ?08/28/21: Hotpack applied to left shoulder x 10 mins in reclined position with L arm supported on several pillows, no adverse reactions.  ? ?Pt issued updates to shoulder HEP today and performed w/ modifications prn. In supine on wedge:  pt able to perform chest press motion w/ dowel, pt required assist/facilitation LUE w/ sh flex when elbows straight.  ?Sidelying: worked on AA/ROM in gravity elim plane for sh flexion and extension to neutral along board. Pt has painful arc b/t approx 45* and 75*, but actually gets better higher.  ?Sidelying: with forearm supported on pillows, performed ER LUE.  ?Seated: AA/ROM BUE's in sh flex along diagonal surface for slight incline w/ assist from Rt hand. ?Standing: isometric strengthening against wall for sh abduction and ER, however discontinued isometric ER for now due to pain up to 10/10. Pt could tolerate isometric abd w/o increase in pain ? ?PATIENT EDUCATION: ?Education details: updates to HEP ?Person educated: Patient ?Education method: Explanation, Demonstration, Verbal cues, and Handouts ?Education comprehension: verbalized understanding, returned demonstration, verbal cues required, and needs further education ? ?    ? ? ? ?  ?HOME EXERCISE PROGRAM: ? ?08/08/21: Initial HEP  ? 08/28/21: Updates to HEP ?08/30/21: Putty HEP  ? ?  ?GOALS: ?Goals reviewed with patient? No ?  ?SHORT TERM GOALS: Target date: 08/28/2021 ?  ?Patient will complete an HEP designed to improve shoulder range of motion ?  ?Goal status: ONGOING ?  ?2.  Patient will utilize left UE to assist with bathing self within restrictions ?  ?Goal status: INITIAL ?  ?3.  Patient will report pain no greater than 3/10 when using left hand to assist with bathing and dressing tasks ?  ?Goal status: INITIAL ?  ?4.  Patient will demonstrate understanding of precautions relating to R-TSR ?  ?Goal status: ONGOING ?  ?  ?LONG TERM GOALS: Target date: 09/25/2021 ?  ?Patient will complete updated HEP ?  ?Goal status: INITIAL ?  ?2.  Patient will demonstrate 5 lb increase in left grip strength ?  ?Goal status: INITIAL ?  ?3.  Patient will report pain no greater than 1-2/10 when functional using LUE during ADL/IADL activities ?  ?Goal status: INITIAL ?  ?4.   Patient will demonstrate at least 100 degrees of shoulder flexion in LUE ?  ?Goal status: INITIAL ?  ?  ?ASSESSMENT: ?  ?CLINICAL IMPRESSION: ?Pt is progressing towards goals. She is limited by pain especially in painful arc ranges ?  ?PERFORMANCE DEFICITS in functional skills including coordination, sensation, edema, ROM, strength, pain, and muscle spasms ?  ?IMPAIRMENTS are limiting patient from ADLs, IADLs, and rest and sleep.  ?  ?COMORBIDITIES may have co-morbidities  that affects occupational performance. Patient will benefit from skilled OT to address above impairments and improve overall function. ?  ?MODIFICATION OR ASSISTANCE TO COMPLETE EVALUATION: No modification of tasks or assist necessary to complete an evaluation. ?  ?OT OCCUPATIONAL PROFILE AND HISTORY: Problem focused assessment: Including review of records relating to presenting problem. ?  ?CLINICAL DECISION MAKING: LOW - limited treatment options, no task modification necessary ?  ?REHAB POTENTIAL:  Good ?  ?EVALUATION COMPLEXITY: Moderate ?  ?  ?PLAN: ?OT FREQUENCY:  1x/week x 2 weeks, then 2x/week x 5 weeks ?  ?OT DURATION: 8 weeks ?  ?PLANNED INTERVENTIONS: self care/ADL training, therapeutic exercise, therapeutic activity, neuromuscular re-education, manual therapy, scar mobilization, passive range of motion, aquatic therapy, ultrasound, moist heat, cryotherapy, patient/family education, and DME and/or AE instructions ?  ?RECOMMENDED OTHER SERVICES: NA ?  ?CONSULTED AND AGREED WITH PLAN OF CARE: Patient ?  ?PLAN FOR NEXT SESSION:  Discuss results from MD appointment, progress as able, ? transferring care to ortho P.T.  ? ? ? ? ?Redmond Baseman, OTR/L ?08/30/21 10:24 AM ?Phone [phone] ?FAX [phone] ? ?

## 2021-09-03 ENCOUNTER — Encounter: Payer: Medicare HMO | Admitting: Occupational Therapy

## 2021-09-04 ENCOUNTER — Encounter: Payer: Medicare HMO | Admitting: Occupational Therapy

## 2021-09-05 ENCOUNTER — Ambulatory Visit: Payer: Medicare HMO | Admitting: Occupational Therapy

## 2021-09-05 DIAGNOSIS — M25612 Stiffness of left shoulder, not elsewhere classified: Secondary | ICD-10-CM | POA: Diagnosis not present

## 2021-09-05 DIAGNOSIS — M6281 Muscle weakness (generalized): Secondary | ICD-10-CM

## 2021-09-05 DIAGNOSIS — G8929 Other chronic pain: Secondary | ICD-10-CM

## 2021-09-05 NOTE — Therapy (Signed)
?OUTPATIENT OCCUPATIONAL THERAPY TREATMENT NOTE ? ? ?Patient Name: Melanie Reynolds ?MRN: [id number] ?DOB:[birth year], 71 y.o., female ?Today's Date: 09/05/2021 ? ?PCP: Margretta Sidle, MD ?REFERRING PROVIDER: Sheryle Hail PA-C ? ?END OF SESSION:  ? OT End of Session - 09/05/21 0936   ? ? Visit Number 8   ? Number of Visits 13   ? Date for OT Re-Evaluation 09/25/21   ? Authorization Type [employer] Medicare and Traditional Medicaid   ? Progress Note Due on Visit 10   ? OT Start Time 8295   ? OT Stop Time 1015   ? OT Time Calculation (min) 40 min   ? Activity Tolerance Patient limited by pain   ? Behavior During Therapy [facility] for tasks assessed/performed   ? ?  ?  ? ?  ? ? ?Past Medical History:  ?Diagnosis Date  ? Allergy   ? Anxiety   ? Arthritis   ? "knees, legs, back" (06/01/2013)  ? Bipolar depression ([place])   ? Cataract   ? Chronic lower back pain   ? Constipation, chronic   ? COPD (chronic obstructive pulmonary disease) ([place])   ? Depression   ? High cholesterol   ? Irritable bowel syndrome (IBS)   ? OSA (obstructive sleep apnea)   ? Osteoporosis   ? Pneumonia 1970's; 1980's; 06/01/2013  ? "total of 3 times"  ? Sleep apnea   ? cpap  ? Type II diabetes mellitus ([place])   ? ?Past Surgical History:  ?Procedure Laterality Date  ? ABDOMINAL HYSTERECTOMY  1982  ? BACK SURGERY    ? CARPAL TUNNEL RELEASE    ? right wrist- with a nerve block placed January 2020  ? CHOLECYSTECTOMY  1990's  ? COLONOSCOPY    ? FOREARM FRACTURE SURGERY Left 1999  ? "put in plates"  ? IMPLANTATION VAGAL NERVE STIMULATOR  2013  ? POSTERIOR LUMBAR FUSION  2000's  ? REVERSE SHOULDER ARTHROPLASTY Left 06/14/2021  ? Procedure: REVERSE SHOULDER ARTHROPLASTY;  Surgeon: Tania Ade, MD;  Location: [facility];  Service: Orthopedics;  Laterality: Left;  ? SHOULDER OPEN ROTATOR CUFF REPAIR Right 2000  ? TUBAL LIGATION  1978  ? VAGAL NERVE STIMULATOR REMOVAL  2013  ? "got an infection in there" (06/01/2013)  ? ?Patient Active Problem List  ? Diagnosis Date Noted  ?  Preoperative clearance 05/22/2021  ? Acute bronchitis 01/25/2020  ? 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 Allergic rhinitis 01/16/2017  ? Legionella pneumonia ([place]) 01/09/2016  ? Sepsis ([place]) 01/04/2016  ? AKI (acute kidney injury) ([place]) 01/04/2016  ? Exertional dyspnea 08/29/2015  ? Tobacco user 08/29/2015  ? Obesity 08/29/2015  ? OSA (obstructive sleep apnea) 08/13/2013  ? CAP (community acquired pneumonia) 06/01/2013  ? Chest pain, musculoskeletal 06/01/2013  ? Bipolar 1 disorder ([place]) 06/01/2013  ? Hypotension 06/01/2013  ? Chronic pain 06/01/2013  ? Diabetes ([place]) 06/01/2013  ? ? ?ONSET DATE: 06/14/21 ? 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   ? ?THERAPY DIAG:  ?Chronic left shoulder pain ? ?Stiffness of left shoulder, not elsewhere classified ? ?Muscle weakness (generalized) ? ? 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   ? ?PRECAUTIONS: follow reverse total shoulder protocol ? ?SUBJECTIVE: The doctor was pleased with my arm; he doesn't have any concerns. My shoulder pain was 10/10 last night. Putty is going good for my hand ? ?PAIN:  ?Are you having pain? Yes: NPRS scale: 5-6/10 upon arrival, up to 62/13 w/ certain ex's t/o session ?Pain location: shoulder ?Pain description: aching ?Aggravating factors:  movement ?Relieving factors: rest, heat ? ? ? ? ? ? ?TODAY'S TREATMENT: ? ?Seated: AA/ROM in mid range sh flexion, abduction, and ER each x 10 reps w/ UE Ranger in gravity elim plane, then slightly inclined surface for sh flexion. (Pt needs to go into some sh abduction for higher sh flexion) ? ?Supine: elevated head/trunk on wedge: chest press to overhead flexion, back to chest press with cane x 5 reps.  ? ?Finger ladder for sh flexion x 3 full climbs (going into sh abduction to get to high sh flex) - pt able to do today (unable last session).  ? ?UBE x 5 min. Level 1 for ROM/conditioning.  ? ? ?  ?HOME EXERCISE PROGRAM: ? ?08/08/21: Initial HEP  ? 08/28/21: Updates to  HEP ?08/30/21: Putty HEP  ? ?  ?GOALS: ?Goals reviewed with patient? yes ?  ?SHORT TERM GOALS: Target date: 08/28/2021 ?  ?Patient will complete an HEP designed to improve shoulder range of motion ?  ?Goal status: MET ?  ?2.  Patient will utilize left UE to assist with bathing self within restrictions ?  ?Goal status: IN PROGRESS ?  ?3.  Patient will report pain no greater than 3/10 when using left hand to assist with bathing and dressing tasks ?  ?Goal status: IN PROGRESS ?  ?4.  Patient will demonstrate understanding of precautions relating to R-TSR ?  ?Goal status: MET ?  ?  ?LONG TERM GOALS: Target date: 09/25/2021 ?  ?Patient will complete updated HEP ?  ?Goal status: IN PROGRESS ?  ?2.  Patient will demonstrate 5 lb increase in left grip strength ?  ?Goal status: IN PROGRESS ?  ?3.  Patient will report pain no greater than 1-2/10 when functional using LUE during ADL/IADL activities ?  ?Goal status: INITIAL ?  ?4.  Patient will demonstrate at least 100 degrees of shoulder flexion in LUE ?  ?Goal status: INITIAL ?  ?  ?ASSESSMENT: ?  ?CLINICAL IMPRESSION: ?Pt is progressing towards goals. Pt able to achieve higher active assist sh flexion today and tolerated UBE well. Pt recently saw orthopedic MD and pt reports he was pleased with her progress. MD sent new referral, however it was still written for O.T. (This therapist will call and attempt to get P.T. referral) ?  ?PERFORMANCE DEFICITS in functional skills including coordination, sensation, edema, ROM, strength, pain, and muscle spasms ?  ?IMPAIRMENTS are limiting patient from ADLs, IADLs, and rest and sleep.  ?  ?COMORBIDITIES may have co-morbidities  that affects occupational performance. Patient will benefit from skilled OT to address above impairments and improve overall function. ?  ?MODIFICATION OR ASSISTANCE TO COMPLETE EVALUATION: No modification of tasks or assist necessary to complete an evaluation. ?  ?OT OCCUPATIONAL PROFILE AND HISTORY: Problem focused  assessment: Including review of records relating to presenting problem. ?  ?CLINICAL DECISION MAKING: LOW - limited treatment options, no task modification necessary ?  ?REHAB POTENTIAL: Good ?  ?EVALUATION COMPLEXITY: Moderate ?  ?  ?PLAN: ?OT FREQUENCY:  1x/week x 2 weeks, then 2x/week x 5 weeks ?  ?OT DURATION: 8 weeks ?  ?PLANNED INTERVENTIONS: self care/ADL training, therapeutic exercise, therapeutic activity, neuromuscular re-education, manual therapy, scar mobilization, passive range of motion, aquatic therapy, ultrasound, moist heat, cryotherapy, patient/family education, and DME and/or AE instructions ?  ?RECOMMENDED OTHER SERVICES: NA ?  ?CONSULTED AND AGREED WITH PLAN OF CARE: Patient ?  ?PLAN FOR NEXT SESSION:  ? transferring care to ortho P.T. (however will continue  to work on AA/ROM, A/ROM Lt shoulder as tolerated until or if transfer can be made) ? ? ? ? ?Redmond Baseman, OTR/L ?09/05/21 2:22 PM ?Phone [phone] ?FAX [phone] ? ? ? ?

## 2021-09-06 ENCOUNTER — Encounter: Payer: Medicare HMO | Admitting: Occupational Therapy

## 2021-09-11 ENCOUNTER — Encounter: Payer: Self-pay | Admitting: Occupational Therapy

## 2021-09-11 ENCOUNTER — Ambulatory Visit: Payer: Medicare HMO | Admitting: Occupational Therapy

## 2021-09-11 DIAGNOSIS — M25612 Stiffness of left shoulder, not elsewhere classified: Secondary | ICD-10-CM

## 2021-09-11 DIAGNOSIS — G8929 Other chronic pain: Secondary | ICD-10-CM

## 2021-09-11 DIAGNOSIS — M6281 Muscle weakness (generalized): Secondary | ICD-10-CM

## 2021-09-11 NOTE — Therapy (Addendum)
OUTPATIENT OCCUPATIONAL THERAPY TREATMENT NOTE   Patient Name: Melanie Reynolds MRN: [id number] DOB:November 09, 1950, 71 y.o., female Today's Date: 09/11/2021  PCP: Margretta Sidle, MD REFERRING PROVIDER: Sheryle Hail PA-C  END OF SESSION:   OT End of Session - 09/11/21 1112     Visit Number 9    Number of Visits 13    Date for OT Re-Evaluation 09/25/21    Authorization Type [employer] Medicare and Traditional Medicaid    Progress Note Due on Visit 10    OT Start Time 1106    OT Stop Time 1150    OT Time Calculation (min) 44 min    Activity Tolerance Patient limited by pain    Behavior During Therapy [facility] for tasks assessed/performed             Past Medical History:  Diagnosis Date   Allergy    Anxiety    Arthritis    "knees, legs, back" (06/01/2013)   Bipolar depression ([place])    Cataract    Chronic lower back pain    Constipation, chronic    COPD (chronic obstructive pulmonary disease) ([place])    Depression    High cholesterol    Irritable bowel syndrome (IBS)    OSA (obstructive sleep apnea)    Osteoporosis    Pneumonia 1970's; 1980's; 06/01/2013   "total of 3 times"   Sleep apnea    cpap   Type II diabetes mellitus ([place])    Past Surgical History:  Procedure Laterality Date   ABDOMINAL HYSTERECTOMY  1982   BACK SURGERY     CARPAL TUNNEL RELEASE     right wrist- with a nerve block placed January 2020   CHOLECYSTECTOMY  1990's   COLONOSCOPY     FOREARM FRACTURE SURGERY Left 1999   "put in plates"   [place]  2013   POSTERIOR LUMBAR FUSION  2000's   REVERSE SHOULDER ARTHROPLASTY Left 06/14/2021   Procedure: REVERSE SHOULDER ARTHROPLASTY;  Surgeon: Tania Ade, MD;  Location: [facility];  Service: Orthopedics;  Laterality: Left;   SHOULDER OPEN ROTATOR CUFF REPAIR Right 2000   TUBAL LIGATION  1978   VAGAL NERVE STIMULATOR REMOVAL  2013   "got an infection in there" (06/01/2013)   Patient Active Problem List   Diagnosis Date Noted    Preoperative clearance 05/22/2021   Acute bronchitis 01/25/2020   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    PRECAUTIONS: follow reverse total shoulder protocol  SUBJECTIVE: I've been up since 5 am this morning. I've been hurting all day but not just my shoulder, my back and all over. I don't like to take pain meds when driving though  PAIN:  Are you having pain? Yes: NPRS scale: 8/10 upon arrival, up to 48/25 w/ certain ex's t/o session Pain  location: shoulder (and entire body) Pain description: aching Aggravating factors: movement Relieving factors: rest, heat     TODAY'S TREATMENT:  Supine: elevated head/trunk on wedge w/ hot pack to Lt shoulder: chest press to overhead flexion, back to chest press with cane x 5 reps.   Sidelying: AA/ROM in scapula retraction/protraction, sh flexion/ext, and ER low range  Seated: AA/ROM in mid range sh flexion, abduction, and ER each x 10 reps w/ UE Ranger in gravity elim plane, then slightly inclined surface for sh flexion. (Pt needs to go into some sh abduction for higher sh flexion)  Pt was able to demo 75-90% active shoulder flex and abduction seated!   UBE x 5 min. Level 1 for  ROM/conditioning.      HOME EXERCISE PROGRAM:  08/08/21: Initial HEP   08/28/21: Updates to HEP 08/30/21: Putty HEP     GOALS: Goals reviewed with patient? yes   SHORT TERM GOALS: Target date: 08/28/2021   Patient will complete an HEP designed to improve shoulder range of motion   Goal status: MET   2.  Patient will utilize left UE to assist with bathing self within restrictions   Goal status: IN PROGRESS   3.  Patient will report pain no greater than 3/10 when using left hand to assist with bathing and dressing tasks   Goal status: IN PROGRESS   4.  Patient will demonstrate understanding of precautions relating to R-TSR   Goal status: MET     LONG TERM GOALS: Target date: 09/25/2021   Patient will complete updated HEP   Goal status: IN PROGRESS   2.  Patient will demonstrate 5 lb increase in left grip strength   Goal status: IN PROGRESS   3.  Patient will report pain no greater than 1-2/10 when functional using LUE during ADL/IADL activities   Goal status: INITIAL   4.  Patient will demonstrate at least 100 degrees of shoulder flexion in LUE   Goal status: INITIAL     ASSESSMENT:   CLINICAL IMPRESSION: Pt is progressing towards goals. Pt able to achieve higher active sh flexion today and tolerated UBE well.   PERFORMANCE DEFICITS in functional skills including coordination, sensation, edema, ROM, strength, pain, and muscle spasms   IMPAIRMENTS are limiting patient from ADLs, IADLs, and rest and sleep.    COMORBIDITIES may have co-morbidities  that affects occupational performance. Patient will benefit from skilled OT to address above impairments and improve overall function.   MODIFICATION OR ASSISTANCE TO COMPLETE EVALUATION: No modification of tasks or assist necessary to complete an evaluation.   OT OCCUPATIONAL PROFILE AND HISTORY: Problem focused assessment: Including review of records relating to presenting problem.   CLINICAL DECISION MAKING: LOW -  limited treatment options, no task modification necessary   REHAB POTENTIAL: Good   EVALUATION COMPLEXITY: Moderate     PLAN: OT FREQUENCY:  1x/week x 2 weeks, then 2x/week x 5 weeks   OT DURATION: 8 weeks   PLANNED INTERVENTIONS: self care/ADL training, therapeutic exercise, therapeutic activity, neuromuscular re-education, manual therapy, scar mobilization, passive range of motion, aquatic therapy, ultrasound, moist heat, cryotherapy, patient/family education, and DME and/or AE instructions   RECOMMENDED OTHER SERVICES: NA   CONSULTED AND AGREED WITH PLAN OF CARE: Patient   PLAN FOR NEXT SESSION:  check STG's, perform functional high level reaching, add low range theraband as tolerated, sh ladder     Redmond Baseman, OTR/L 09/11/21 11:14 AM Phone [phone] FAX [phone]

## 2021-09-13 ENCOUNTER — Encounter: Payer: Medicare HMO | Admitting: Occupational Therapy

## 2021-09-14 ENCOUNTER — Ambulatory Visit: Payer: Medicare HMO | Admitting: Occupational Therapy

## 2021-09-14 ENCOUNTER — Encounter: Payer: Self-pay | Admitting: Occupational Therapy

## 2021-09-14 DIAGNOSIS — R2681 Unsteadiness on feet: Secondary | ICD-10-CM

## 2021-09-14 DIAGNOSIS — M6281 Muscle weakness (generalized): Secondary | ICD-10-CM

## 2021-09-14 DIAGNOSIS — G8929 Other chronic pain: Secondary | ICD-10-CM

## 2021-09-14 DIAGNOSIS — M25612 Stiffness of left shoulder, not elsewhere classified: Secondary | ICD-10-CM | POA: Diagnosis not present

## 2021-09-14 NOTE — Therapy (Signed)
OUTPATIENT OCCUPATIONAL THERAPY TREATMENT NOTE AND PROGRESS UPDATE   Patient Name: Melanie Reynolds MRN: [id number] DOB:1950-11-01, 71 y.o., female Today's Date: 09/14/2021  PCP: Margretta Sidle, MD REFERRING PROVIDER: Sheryle Hail PA-C  END OF SESSION:   OT End of Session - 09/14/21 1245     Visit Number 10    Number of Visits 13    Date for OT Re-Evaluation 09/25/21    Authorization Type [employer] Medicare and Traditional Medicaid    Progress Note Due on Visit 10    OT Start Time 1230    Activity Tolerance Patient limited by pain    Behavior During Therapy [facility] for tasks assessed/performed             Past Medical History:  Diagnosis Date   Allergy    Anxiety    Arthritis    "knees, legs, back" (06/01/2013)   Bipolar depression ([place])    Cataract    Chronic lower back pain    Constipation, chronic    COPD (chronic obstructive pulmonary disease) ([place])    Depression    High cholesterol    Irritable bowel syndrome (IBS)    OSA (obstructive sleep apnea)    Osteoporosis    Pneumonia 1970's; 1980's; 06/01/2013   "total of 3 times"   Sleep apnea    cpap   Type II diabetes mellitus ([place])    Past Surgical History:  Procedure Laterality Date   ABDOMINAL HYSTERECTOMY  1982   BACK SURGERY     CARPAL TUNNEL RELEASE     right wrist- with a nerve block placed January 2020   CHOLECYSTECTOMY  1990's   COLONOSCOPY     FOREARM FRACTURE SURGERY Left 1999   "put in plates"   [place]  2013   POSTERIOR LUMBAR FUSION  2000's   REVERSE SHOULDER ARTHROPLASTY Left 06/14/2021   Procedure: REVERSE SHOULDER ARTHROPLASTY;  Surgeon: Tania Ade, MD;  Location: [facility];  Service: Orthopedics;  Laterality: Left;   SHOULDER OPEN ROTATOR CUFF REPAIR Right 2000   TUBAL LIGATION  1978   VAGAL NERVE STIMULATOR REMOVAL  2013   "got an infection in there" (06/01/2013)   Patient Active Problem List   Diagnosis Date Noted   Preoperative clearance 05/22/2021   Acute  bronchitis 01/25/2020   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    PRECAUTIONS: follow reverse total shoulder protocol  SUBJECTIVE: I've been up since 5 am this morning. I've been hurting all day but not just my shoulder, my back and all over. I don't like to take pain meds when driving though  PAIN:  Are you having pain? Yes: NPRS scale: 8/10 upon arrival, up to 06/77 w/ certain ex's t/o session Pain location: shoulder (and entire body) Pain description: aching Aggravating  factors: movement Relieving factors: rest, heat     TODAY'S TREATMENT: 09/14/21:   Patient reports pain is significantly improving.  She reports using left arm functionally more often with bathing and dressing and light household skills.  Patient started with hot pack on shoulder x 10 min while reviewing the benefit of using left arm more naturally for tasks like dressing - pushing arm into sleeve versus dressing arm.  Patient able to demonstrate.   Patient has met all short term goals and is approaching long term goals.  Patient pleased with progression.  Initiated yellow theraband exercises for scaption/abduction below shoulder height.  Not yet  issued as HEP     HOME EXERCISE PROGRAM:  08/08/21: Initial HEP   08/28/21: Updates to HEP 08/30/21: Putty HEP     GOALS: Goals reviewed with patient? yes   SHORT TERM GOALS: Target date: 08/28/2021   Patient will complete an HEP designed to improve shoulder range of motion   Goal status: MET   2.  Patient will utilize left UE to assist with bathing self within restrictions   Goal status: MET   3.  Patient will report pain no greater than 3/10 when using left hand to assist with bathing and dressing tasks   Goal status: MET   4.  Patient will demonstrate understanding of precautions relating to R-TSR   Goal status: MET     LONG TERM GOALS: Target date: 09/25/2021   Patient will complete updated HEP   Goal status: IN PROGRESS   2.  Patient will demonstrate 5 lb increase in left grip strength   Goal status: MET - 21 LBS ON INITIAL EVALUATION - 09/14/21 - 45.8LBS!   3.  Patient will report pain no greater than 1-2/10 when functional using LUE during ADL/IADL activities   Goal status: INITIAL   4.  Patient will demonstrate at least 100 degrees of shoulder flexion in LUE   Goal status: INITIAL     ASSESSMENT:   CLINICAL IMPRESSION: This progress report covers dates of service from 07/31/21-09/14/21.  Patient has met all short term goals and some long term goals.  Patient pleased with progress although continues to experience pain especially in supine.    PERFORMANCE DEFICITS in functional skills including coordination, sensation, edema, ROM, strength, pain, and muscle spasms   IMPAIRMENTS are limiting patient from ADLs, IADLs, and rest and sleep.    COMORBIDITIES may have co-morbidities  that affects occupational performance. Patient will benefit from skilled OT to address above impairments and improve overall function.   MODIFICATION OR ASSISTANCE TO COMPLETE EVALUATION: No modification of tasks or assist necessary to complete an evaluation.   OT OCCUPATIONAL PROFILE  AND HISTORY: Problem focused assessment: Including review of records relating to presenting problem.   CLINICAL DECISION MAKING: LOW - limited treatment options, no task modification necessary   REHAB POTENTIAL: Good   EVALUATION COMPLEXITY: Moderate     PLAN: OT FREQUENCY:  1x/week x 2 weeks, then 2x/week x 5 weeks   OT DURATION: 8 weeks   PLANNED INTERVENTIONS: self care/ADL training, therapeutic exercise, therapeutic activity, neuromuscular re-education, manual therapy, scar mobilization, passive range of motion, aquatic therapy, ultrasound, moist heat, cryotherapy, patient/family education, and DME and/or AE instructions   RECOMMENDED OTHER SERVICES: NA   CONSULTED AND AGREED WITH PLAN OF CARE: Patient   PLAN FOR NEXT SESSION:  perform functional high level reaching, add low range theraband as tolerated, sh ladder   Antony Salmon, OTR/L 09/14/21 3:24 PM Phone: [phone] Fax: [phone]

## 2021-09-18 ENCOUNTER — Encounter: Payer: Medicare HMO | Admitting: Occupational Therapy

## 2021-09-18 ENCOUNTER — Encounter: Payer: Self-pay | Admitting: Occupational Therapy

## 2021-09-18 ENCOUNTER — Ambulatory Visit: Payer: Medicare HMO | Admitting: Occupational Therapy

## 2021-09-18 DIAGNOSIS — M25612 Stiffness of left shoulder, not elsewhere classified: Secondary | ICD-10-CM

## 2021-09-18 DIAGNOSIS — G8929 Other chronic pain: Secondary | ICD-10-CM

## 2021-09-18 DIAGNOSIS — M6281 Muscle weakness (generalized): Secondary | ICD-10-CM
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
## 2021-09-18 NOTE — Patient Instructions (Signed)
  Strengthening: Resisted Flexion   Hold tubing with ___Lt__ arm(s) at side. Pull forward and up. Move shoulder through pain-free range of motion. Repeat __5-10__ times per set.  Do _2_ sessions per day , every other day  Strengthening: Resisted Extension   Hold tubing in __Lt___ hand(s), arm forward. Pull arm back, elbow straight. Repeat _5-10___ times per set. Do _2___ sessions per day, every other day.    SHOULDER: Abduction (Band)    Holding band, raise arm out and up. Keep elbow straight. Do not shrug shoulders. Use ___yellow_____ band. _5-10__ reps per set, _2__ sets per day, every other day  Shoulder External Rotators    With left elbow bent 90 and held at side, move hand away from body, keeping elbow at side. Use yellow tubing. Keep head and back straight.  Repeat _5-10___ times. Do __2__ sessions per day, every other day. CAUTION: Move slowly.

## 2021-09-20 ENCOUNTER — Encounter: Payer: Medicare HMO | Admitting: Occupational Therapy

## 2021-09-21 ENCOUNTER — Encounter: Payer: Self-pay | Admitting: Occupational Therapy

## 2021-09-21 ENCOUNTER — Ambulatory Visit: Payer: Medicare HMO | Admitting: Occupational Therapy

## 2021-09-21 DIAGNOSIS — R2681 Unsteadiness on feet: Secondary | ICD-10-CM

## 2021-09-21 DIAGNOSIS — M6281 Muscle weakness (generalized): Secondary | ICD-10-CM

## 2021-09-21 DIAGNOSIS — G8929 Other chronic pain: Secondary | ICD-10-CM

## 2021-09-21 DIAGNOSIS — M25612 Stiffness of left shoulder, not elsewhere classified: Secondary | ICD-10-CM
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
## 2021-11-14 ENCOUNTER — Other Ambulatory Visit: Payer: Self-pay | Admitting: Internal Medicine

## 2021-11-14 DIAGNOSIS — Z1231 Encounter for screening mammogram for malignant neoplasm of breast: Secondary | ICD-10-CM

## 2021-11-29 ENCOUNTER — Ambulatory Visit
Admission: RE | Admit: 2021-11-29 | Discharge: 2021-11-29 | Disposition: A | Discharge status: Home / Self Care | Payer: Medicare HMO | Source: Ambulatory Visit | Attending: Internal Medicine | Admitting: Internal Medicine

## 2021-11-29 DIAGNOSIS — Z1231 Encounter for screening mammogram for malignant neoplasm of breast: Secondary | ICD-10-CM

## 2022-01-07 ENCOUNTER — Telehealth: Payer: Self-pay

## 2022-01-07 NOTE — Telephone Encounter (Signed)
Notes scanned to referral 

## 2022-01-16 ENCOUNTER — Ambulatory Visit: Payer: Medicare HMO | Admitting: Internal Medicine

## 2022-01-23 ENCOUNTER — Ambulatory Visit: Payer: Medicare HMO | Attending: Cardiovascular Disease | Admitting: Cardiovascular Disease

## 2022-01-23 ENCOUNTER — Encounter: Payer: Self-pay | Admitting: Cardiovascular Disease

## 2022-01-23 DIAGNOSIS — R0609 Other forms of dyspnea: Secondary | ICD-10-CM | POA: Diagnosis not present

## 2022-01-23 DIAGNOSIS — G4733 Obstructive sleep apnea (adult) (pediatric): Secondary | ICD-10-CM

## 2022-01-23 DIAGNOSIS — R0789 Other chest pain: Secondary | ICD-10-CM

## 2022-01-23 DIAGNOSIS — Z720 Tobacco use: Secondary | ICD-10-CM

## 2022-01-23 NOTE — Patient Instructions (Addendum)
Medication Instructions:  No changes *If you need a refill on your cardiac medications before your next appointment, please call your pharmacy*   Lab Work: Your provider would like for you to return in few days to have the following labs drawn: fasting Lipid. You do not need an appointment for the lab. Once in our office lobby there is a podium where you can sign in and ring the doorbell to alert [place] that you are here. The lab is open from 8:00 am to 4 pm; closed for lunch from 12:45pm-1:45pm.  You may also go to any of these [facility] locations:   [facility] ([place]) - [place] [address]     If you have labs (blood work) drawn today and your tests are completely normal, you will receive your results only by: [employer] (if you have MyChart) OR A paper copy in the mail If you have any lab test that is abnormal or we need to change your treatment, we will call you to review the results.   Testing/Procedures: Your physician has requested that you have an echocardiogram. Echocardiography is a painless test that uses sound waves to create images of your heart. It provides your doctor with information about the size and shape of your heart and how well your heart's chambers and valves are working. You may receive an ultrasound enhancing agent through an IV if needed to better visualize your heart during the echo.This procedure takes approximately one hour. There are no restrictions for this procedure. This will take place at the [address]   Your physician has requested that you have a lexiscan myoview. For further information please visit [email or web address]. Please follow instruction sheet, as given. This will take place at [address], [address]  How to prepare for your Myocardial Perfusion Test: Do not eat or drink 3 hours prior to your test, except you may have water. Do  not consume products containing caffeine (regular or decaffeinated) 12 hours prior to your test. (ex: coffee, chocolate, sodas, tea). Do bring a list of your current medications with you.  If not listed below, you may take your medications as normal. Do wear comfortable clothes (no dresses or overalls) and walking shoes, tennis shoes preferred (No heels or open toe shoes are allowed). Do NOT wear cologne, perfume, aftershave, or lotions (deodorant is allowed). The test will take approximately 3 to 4 hours to complete If these instructions are not followed, your test will have to be rescheduled.  Dr. Gwenlyn Found has ordered a CT coronary calcium score.   Test locations:  [place]   This is $99 out of pocket.   Coronary CalciumScan A coronary calcium scan is an imaging test used to look for deposits of calcium and other fatty materials (plaques) in the inner lining of the blood vessels of the heart (coronary arteries). These deposits of calcium and plaques can partly clog and narrow the coronary arteries without producing any symptoms or warning signs. This puts a person at risk for a heart attack. This test can detect these deposits before symptoms develop. Tell a health care provider about: Any allergies you have. All medicines you are taking, including vitamins, herbs, eye drops, creams, and over-the-counter medicines. Any problems you or family members have had with anesthetic medicines. Any blood disorders you have. Any surgeries you have had. Any medical conditions  you have. Whether you are pregnant or may be pregnant. What are the risks? Generally, this is a safe procedure. However, problems may occur, including: Harm to a pregnant woman and her unborn baby. This test involves the use of radiation. Radiation exposure can be dangerous to a pregnant woman and her unborn baby. If you are pregnant, you generally should not have this procedure done. Slight  increase in the risk of cancer. This is because of the radiation involved in the test. What happens before the procedure? No preparation is needed for this procedure. What happens during the procedure? You will undress and remove any jewelry around your neck or chest. You will put on a hospital gown. Sticky electrodes will be placed on your chest. The electrodes will be connected to an electrocardiogram (ECG) machine to record a tracing of the electrical activity of your heart. A CT scanner will take pictures of your heart. During this time, you will be asked to lie still and hold your breath for 2-3 seconds while a picture of your heart is being taken. The procedure may vary among health care providers and hospitals. What happens after the procedure? You can get dressed. You can return to your normal activities. It is up to you to get the results of your test. Ask your health care provider, or the department that is doing the test, when your results will be ready. Summary A coronary calcium scan is an imaging test used to look for deposits of calcium and other fatty materials (plaques) in the inner lining of the blood vessels of the heart (coronary arteries). Generally, this is a safe procedure. Tell your health care provider if you are pregnant or may be pregnant. No preparation is needed for this procedure. A CT scanner will take pictures of your heart. You can return to your normal activities after the scan is done. This information is not intended to replace advice given to you by your health care provider. Make sure you discuss any questions you have with your health care provider. Document Released: 10/12/2007 Document Revised: 03/04/2016 Document Reviewed: 03/04/2016 Elsevier Interactive Patient Education  2017 [place]: At [facility], you and your health needs are our priority.  As part of our continuing mission to provide you with exceptional heart  care, we have created designated Provider Care Teams.  These Care Teams include your primary Cardiologist (physician) and Advanced Practice Providers (APPs -  Physician Assistants and Nurse Practitioners) who all work together to provide you with the care you need, when you need it.  We recommend signing up for the patient portal called "MyChart".  Sign up information is provided on this After Visit Summary.  MyChart is used to connect with patients for Virtual Visits (Telemedicine).  Patients are able to view lab/test results, encounter notes, upcoming appointments, etc.  Non-urgent messages can be sent to your provider as well.   To learn more about what you can do with MyChart, go to [email or web address].    Your next appointment:   Follow up as needed based on results
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
## 2022-01-23 NOTE — Assessment & Plan Note (Signed)
History of obstructive sleep apnea on CPAP. 

## 2022-01-23 NOTE — Assessment & Plan Note (Signed)
50 to 100 pack years tobacco abuse having quit several months ago.

## 2022-01-23 NOTE — Assessment & Plan Note (Signed)
Patient complains of dyspnea on exertion for the last year.  She did have a normal 2D echo 12/13/2018.  She smoked 50 to 100 pack years and quit only a few months ago.  She does not have a formal diagnosis of COPD but probably should see a pulmonologist to further evaluate.  I am going to get a 2D echocardiogram and a Lexiscan Myoview to further evaluate.

## 2022-01-23 NOTE — Assessment & Plan Note (Signed)
BMI 36 

## 2022-01-23 NOTE — Assessment & Plan Note (Signed)
Ms. Melanie Reynolds complains of occasional atypical chest pain mostly to the right of her sternum which gets better when she presses it with her hand.  She did have a chest CT performed 03/30/2021 as part of low-dose cancer screening which showed some aortic atherosclerosis.  I am going to get a Lexiscan Myoview stress test and a coronary calcium score to further evaluate.

## 2022-02-03 NOTE — Progress Notes (Unsigned)
HPI F smoker followed for OSA, complicated by Seasonal Allergy, Bipolar, Low Back Pain/ Lumbar Disc disease, DM2/ neuropathy, Hy[perlipidemeia, Tobacco use, Aortic Atherosclerosis, NPSG 06/22/13 AHI 23.4/ hr, desaturation to 82%, body weight 226 lbs  --------------------------------------------------------------------------------------------   05/22/21-  70yoF Smoker (22.5 pk yrs) followed for OSA, complicated by Seasonal Allergy, Bipolar 1, Low Back Pain/ Lumbar Disc disease, DM2/ neuropathy, Hyperlipidemia, Tobacco use, Obesity, Aortic Atherosclerosis,  Referred to Dr Ron Parker for oral appliance - went back to CPAP -Ventolin hfa, Nicoderm,  CPAP 9/ [facility] Body weight today 2205 lbs Did fine with CPAP until indoor heat came on. Doesn't know how to adjust CPAP humidifier heat and can't tolerate it when home heat is on. We will help her get in touch with [employer]. Pending shoulder surgery next month. No interval medical concerns with heart or breathing, no recent infection,feels well. CT chest low dose screening 03/30/21- 1. Lung-RADS 1, negative. Continue annual screening with low-dose chest CT without contrast in 12 months. 2. Aortic atherosclerosis. Aortic Atherosclerosis (ICD10-I70.0).  02/05/22-  70yoF Smoker (50+ pk yrs) followed for OSA, complicated by Seasonal Allergy, Bipolar 1, Low Back Pain/ Lumbar Disc disease, DM2/ neuropathy, Hyperlipidemia, Tobacco use, Obesity, Aortic Atherosclerosis,  Referred to Dr Ron Parker for oral appliance - went back to CPAP -Ventolin hfa, Nicoderm,  CPAP 9/ [employer] Download compliance- Body weight today Covid vax- Flu vax- Saw Cardiology for DOE and CP----?? Need PFT??  ROS-see HPI   + = positive Constitutional:    weight loss, night sweats, fevers, chills, +fatigue, lassitude. HEENT:    headaches, difficulty swallowing, tooth/dental problems, sore throat,       sneezing, itching, ear ache, nasal congestion, post nasal drip, snoring CV:    chest pain,  orthopnea, PND, swelling in lower extremities, anasarca,                                   dizziness, palpitations Resp:   shortness of breath with exertion or at rest.                productive cough,   non-productive cough, coughing up of blood.              change in color of mucus.  wheezing.   Skin:    rash or lesions. GI:  No-   heartburn, indigestion, abdominal pain, nausea, vomiting, diarrhea,                 change in bowel habits, loss of appetite GU: dysuria, change in color of urine, no urgency or frequency.   flank pain. MS:   joint pain, stiffness, decreased range of motion, back pain. Neuro-     nothing unusual Psych:  change in mood or affect.  depression or anxiety.   memory loss.  OBJ- Physical Exam General- Alert, Oriented, Affect-appropriate, Distress- none acute, + obese Skin- rash-none, lesions- none, excoriation- none Lymphadenopathy- none Head- atraumatic            Eyes- Gross vision intact, PERRLA, conjunctivae and secretions clear            Ears- Hearing, canals-normal            Nose- Clear, no-Septal dev, mucus, polyps, erosion, perforation             Throat- Mallampati III-IV , mucosa clear , drainage- none, tonsils- atrophic, + missing teeth Neck- flexible , trachea midline, no stridor , thyroid  nl, carotid no bruit Chest - symmetrical excursion , unlabored           Heart/CV- RRR , no murmur , no gallop  , no rub, nl s1 s2                           - JVD- none , edema- none, stasis changes- none, varices- none           Lung- clear to P&A, wheeze- none, cough- none , dullness-none, rub- none           Chest wall-  Abd-  Br/ Gen/ Rectal- Not done, not indicated Extrem- cyanosis- none, clubbing, none, atrophy- none, strength- nl Neuro- grossly intact to observation

## 2022-02-05 ENCOUNTER — Ambulatory Visit (INDEPENDENT_AMBULATORY_CARE_PROVIDER_SITE_OTHER): Payer: Medicare HMO | Admitting: Internal Medicine

## 2022-02-05 ENCOUNTER — Other Ambulatory Visit (HOSPITAL_BASED_OUTPATIENT_CLINIC_OR_DEPARTMENT_OTHER): Payer: Medicare HMO

## 2022-02-05 ENCOUNTER — Ambulatory Visit (HOSPITAL_COMMUNITY)
Admission: RE | Admit: 2022-02-05 | Discharge: 2022-02-05 | Disposition: A | Discharge status: Home / Self Care | Payer: Medicare HMO | Source: Ambulatory Visit | Attending: Internal Medicine | Admitting: Internal Medicine

## 2022-02-05 ENCOUNTER — Encounter: Payer: Self-pay | Admitting: Internal Medicine

## 2022-02-05 ENCOUNTER — Other Ambulatory Visit: Payer: Self-pay | Admitting: Internal Medicine

## 2022-02-05 VITALS — BP 114/80 | HR 98 | Ht 63.0 in | Wt 205.4 lb

## 2022-02-05 DIAGNOSIS — R0609 Other forms of dyspnea: Secondary | ICD-10-CM | POA: Diagnosis not present

## 2022-02-05 DIAGNOSIS — E119 Type 2 diabetes mellitus without complications: Secondary | ICD-10-CM | POA: Diagnosis present

## 2022-02-05 DIAGNOSIS — E039 Hypothyroidism, unspecified: Secondary | ICD-10-CM

## 2022-02-05 DIAGNOSIS — R079 Chest pain, unspecified: Secondary | ICD-10-CM | POA: Diagnosis present

## 2022-02-05 DIAGNOSIS — Z720 Tobacco use: Secondary | ICD-10-CM

## 2022-02-05 DIAGNOSIS — G4733 Obstructive sleep apnea (adult) (pediatric): Secondary | ICD-10-CM

## 2022-02-05 LAB — CBC WITH DIFFERENTIAL/PLATELET
Basophils Absolute: 0 10*3/uL (ref 0.0–0.1)
Basophils Relative: 0.7 % (ref 0.0–3.0)
Eosinophils Absolute: 0.1 10*3/uL (ref 0.0–0.7)
Eosinophils Relative: 1.4 % (ref 0.0–5.0)
HCT: 43.3 % (ref 36.0–46.0)
Hemoglobin: 13.6 g/dL (ref 12.0–15.0)
Lymphocytes Relative: 42.3 % (ref 12.0–46.0)
Lymphs Abs: 3.2 10*3/uL (ref 0.7–4.0)
MCHC: 31.5 g/dL (ref 30.0–36.0)
MCV: 92.2 fl (ref 78.0–100.0)
Monocytes Absolute: 0.7 10*3/uL (ref 0.1–1.0)
Monocytes Relative: 9.8 % (ref 3.0–12.0)
Neutro Abs: 3.4 10*3/uL (ref 1.4–7.7)
Neutrophils Relative %: 45.8 % (ref 43.0–77.0)
Platelets: 320 10*3/uL (ref 150.0–400.0)
RBC: 4.69 Mil/uL (ref 3.87–5.11)
RDW: 14.9 % (ref 11.5–15.5)
WBC: 7.5 10*3/uL (ref 4.0–10.5)

## 2022-02-05 LAB — TSH: TSH: 1.73 u[IU]/mL (ref 0.35–5.50)

## 2022-02-05 LAB — D-DIMER, QUANTITATIVE: D-Dimer, Quant: 0.74 mcg/mL FEU — ABNORMAL HIGH (ref <0.50)

## 2022-02-05 LAB — POCT I-STAT CREATININE: Creatinine, Ser: 1 mg/dL (ref 0.44–1.00)

## 2022-02-05 MED ORDER — IOHEXOL 350 MG/ML SOLN
75.0000 mL | Freq: Once | INTRAVENOUS | Status: AC | PRN
  Administered 2022-02-05: 75 mL via INTRAVENOUS

## 2022-02-05 MED ORDER — SODIUM CHLORIDE (PF) 0.9 % IJ SOLN
INTRAMUSCULAR | Status: AC
  Filled 2022-02-05: qty 50

## 2022-02-05 NOTE — Assessment & Plan Note (Signed)
If going back to nasal pillows mask still not comfortable, we will go to mask fitting. Discussed compliance goals.

## 2022-02-05 NOTE — Progress Notes (Signed)
Spoke with the pt and notified of results/recs. Pt verbalized understanding. She was agreeable to CTA and I have ordered this to be done STAT.

## 2022-02-05 NOTE — Patient Instructions (Signed)
Order- DME [employer]- please change mask to nasal pillows (size small). Continue 9 cwp, supplies, AirView/ card  Order- schedule PFT   dx dyspnea on exertion  Order- lab-CBC w dif, TSH, D-dimer   dx hypothyroid, dyspnea on exertion

## 2022-02-05 NOTE — Assessment & Plan Note (Signed)
Encourage more effort to stop.

## 2022-02-05 NOTE — Assessment & Plan Note (Signed)
Tobacco user. Need to define pulmonary status Plan- PFT, labs for TSH, CBC, D-dimer

## 2022-02-06 ENCOUNTER — Telehealth (HOSPITAL_COMMUNITY): Payer: Self-pay | Admitting: *Deleted

## 2022-02-06 NOTE — Telephone Encounter (Signed)
Patient given detailed instructions per Myocardial Perfusion Study Information Sheet for the test on 02/13/22 at 1030. Patient notified to arrive 15 minutes early and that it is imperative to arrive on time for appointment to keep from having the test rescheduled.  If you need to cancel or reschedule your appointment, please call the office within 24 hours of your appointment. . Patient verbalized understanding.Kenyette Gundy, Ranae Palms

## 2022-02-08 ENCOUNTER — Ambulatory Visit (HOSPITAL_COMMUNITY): Admission: RE | Admit: 2022-02-08 | Payer: Medicare HMO | Source: Ambulatory Visit

## 2022-02-11 ENCOUNTER — Other Ambulatory Visit: Payer: Self-pay | Admitting: *Deleted

## 2022-02-11 DIAGNOSIS — R0609 Other forms of dyspnea: Secondary | ICD-10-CM

## 2022-02-11 NOTE — Progress Notes (Unsigned)
Attestation for Melanie Reynolds

## 2022-02-13 ENCOUNTER — Ambulatory Visit (HOSPITAL_COMMUNITY): Payer: Medicare HMO | Attending: Internal Medicine

## 2022-02-13 ENCOUNTER — Ambulatory Visit (HOSPITAL_BASED_OUTPATIENT_CLINIC_OR_DEPARTMENT_OTHER): Payer: Medicare HMO

## 2022-02-13 DIAGNOSIS — R0609 Other forms of dyspnea: Secondary | ICD-10-CM | POA: Diagnosis present

## 2022-02-13 LAB — MYOCARDIAL PERFUSION IMAGING
LV dias vol: 56 mL (ref 46–106)
LV sys vol: 15 mL
Nuc Stress EF: 74 %
Peak HR: 95 {beats}/min
Rest HR: 67 {beats}/min
Rest Nuclear Isotope Dose: 10.4 mCi
SDS: 2
SRS: 0
SSS: 2
ST Depression (mm): 0 mm
Stress Nuclear Isotope Dose: 31.2 mCi
TID: 1.03

## 2022-02-13 LAB — ECHOCARDIOGRAM COMPLETE
Area-P 1/2: 2.24 cm2
S' Lateral: 2.6 cm

## 2022-02-13 MED ORDER — REGADENOSON 0.4 MG/5ML IV SOLN
0.4000 mg | Freq: Once | INTRAVENOUS | Status: AC
  Administered 2022-02-13: 0.4 mg via INTRAVENOUS

## 2022-02-13 MED ORDER — TECHNETIUM TC 99M TETROFOSMIN IV KIT
31.2000 | PACK | Freq: Once | INTRAVENOUS | Status: AC | PRN
  Administered 2022-02-13: 31.2 via INTRAVENOUS

## 2022-02-13 MED ORDER — TECHNETIUM TC 99M TETROFOSMIN IV KIT
10.4000 | PACK | Freq: Once | INTRAVENOUS | Status: AC | PRN
  Administered 2022-02-13: 10.4 via INTRAVENOUS

## 2022-02-21 ENCOUNTER — Ambulatory Visit (HOSPITAL_COMMUNITY): Payer: Medicare HMO

## 2022-03-04 ENCOUNTER — Other Ambulatory Visit (HOSPITAL_BASED_OUTPATIENT_CLINIC_OR_DEPARTMENT_OTHER): Payer: Medicare HMO | Admitting: Internal Medicine

## 2022-03-05 ENCOUNTER — Ambulatory Visit (HOSPITAL_BASED_OUTPATIENT_CLINIC_OR_DEPARTMENT_OTHER): Payer: Medicare HMO | Attending: Internal Medicine | Admitting: Radiology

## 2022-03-05 DIAGNOSIS — E039 Hypothyroidism, unspecified: Secondary | ICD-10-CM

## 2022-03-05 DIAGNOSIS — R0609 Other forms of dyspnea: Secondary | ICD-10-CM

## 2022-03-28 ENCOUNTER — Encounter: Payer: Self-pay | Admitting: Gastroenterology

## 2022-05-30 ENCOUNTER — Encounter: Payer: Self-pay | Admitting: Gastroenterology

## 2022-06-03 DIAGNOSIS — Z0289 Encounter for other administrative examinations: Secondary | ICD-10-CM | POA: Diagnosis not present

## 2022-06-03 DIAGNOSIS — E1142 Type 2 diabetes mellitus with diabetic polyneuropathy: Secondary | ICD-10-CM | POA: Diagnosis not present

## 2022-06-03 DIAGNOSIS — E1159 Type 2 diabetes mellitus with other circulatory complications: Secondary | ICD-10-CM | POA: Diagnosis not present

## 2022-06-03 DIAGNOSIS — Z1211 Encounter for screening for malignant neoplasm of colon: Secondary | ICD-10-CM | POA: Diagnosis not present

## 2022-06-03 DIAGNOSIS — F319 Bipolar disorder, unspecified: Secondary | ICD-10-CM | POA: Diagnosis not present

## 2022-06-03 DIAGNOSIS — F1120 Opioid dependence, uncomplicated: Secondary | ICD-10-CM | POA: Diagnosis not present

## 2022-06-03 DIAGNOSIS — I10 Essential (primary) hypertension: Secondary | ICD-10-CM | POA: Diagnosis not present

## 2022-06-06 DIAGNOSIS — Z841 Family history of disorders of kidney and ureter: Secondary | ICD-10-CM | POA: Diagnosis not present

## 2022-06-06 DIAGNOSIS — Z8379 Family history of other diseases of the digestive system: Secondary | ICD-10-CM | POA: Diagnosis not present

## 2022-06-06 DIAGNOSIS — E1159 Type 2 diabetes mellitus with other circulatory complications: Secondary | ICD-10-CM | POA: Diagnosis not present

## 2022-06-06 DIAGNOSIS — G250 Essential tremor: Secondary | ICD-10-CM | POA: Diagnosis not present

## 2022-06-06 DIAGNOSIS — E876 Hypokalemia: Secondary | ICD-10-CM | POA: Diagnosis not present

## 2022-06-06 DIAGNOSIS — E1142 Type 2 diabetes mellitus with diabetic polyneuropathy: Secondary | ICD-10-CM | POA: Diagnosis not present

## 2022-06-06 DIAGNOSIS — E1122 Type 2 diabetes mellitus with diabetic chronic kidney disease: Secondary | ICD-10-CM | POA: Diagnosis not present

## 2022-06-06 DIAGNOSIS — Z7984 Long term (current) use of oral hypoglycemic drugs: Secondary | ICD-10-CM | POA: Diagnosis not present

## 2022-06-06 DIAGNOSIS — Z833 Family history of diabetes mellitus: Secondary | ICD-10-CM | POA: Diagnosis not present

## 2022-06-06 DIAGNOSIS — I130 Hypertensive heart and chronic kidney disease with heart failure and stage 1 through stage 4 chronic kidney disease, or unspecified chronic kidney disease: Secondary | ICD-10-CM | POA: Diagnosis not present

## 2022-06-06 DIAGNOSIS — M5430 Sciatica, unspecified side: Secondary | ICD-10-CM | POA: Diagnosis not present

## 2022-06-06 DIAGNOSIS — K5900 Constipation, unspecified: Secondary | ICD-10-CM | POA: Diagnosis not present

## 2022-06-06 DIAGNOSIS — E785 Hyperlipidemia, unspecified: Secondary | ICD-10-CM | POA: Diagnosis not present

## 2022-06-06 DIAGNOSIS — N189 Chronic kidney disease, unspecified: Secondary | ICD-10-CM | POA: Diagnosis not present

## 2022-06-06 DIAGNOSIS — Z811 Family history of alcohol abuse and dependence: Secondary | ICD-10-CM | POA: Diagnosis not present

## 2022-06-06 DIAGNOSIS — F319 Bipolar disorder, unspecified: Secondary | ICD-10-CM | POA: Diagnosis not present

## 2022-06-06 DIAGNOSIS — F259 Schizoaffective disorder, unspecified: Secondary | ICD-10-CM | POA: Diagnosis not present

## 2022-06-06 DIAGNOSIS — G4733 Obstructive sleep apnea (adult) (pediatric): Secondary | ICD-10-CM | POA: Diagnosis not present

## 2022-06-06 DIAGNOSIS — M1990 Unspecified osteoarthritis, unspecified site: Secondary | ICD-10-CM | POA: Diagnosis not present

## 2022-06-06 DIAGNOSIS — M62838 Other muscle spasm: Secondary | ICD-10-CM | POA: Diagnosis not present

## 2022-06-06 DIAGNOSIS — I700 Atherosclerosis of aorta: Secondary | ICD-10-CM | POA: Diagnosis not present

## 2022-06-13 ENCOUNTER — Telehealth: Payer: Self-pay

## 2022-06-13 DIAGNOSIS — M25512 Pain in left shoulder: Secondary | ICD-10-CM | POA: Diagnosis not present

## 2022-06-13 DIAGNOSIS — G894 Chronic pain syndrome: Secondary | ICD-10-CM | POA: Diagnosis not present

## 2022-06-13 DIAGNOSIS — E1142 Type 2 diabetes mellitus with diabetic polyneuropathy: Secondary | ICD-10-CM | POA: Diagnosis not present

## 2022-06-13 DIAGNOSIS — M19012 Primary osteoarthritis, left shoulder: Secondary | ICD-10-CM | POA: Diagnosis not present

## 2022-06-13 DIAGNOSIS — Z1389 Encounter for screening for other disorder: Secondary | ICD-10-CM | POA: Diagnosis not present

## 2022-06-13 DIAGNOSIS — M47816 Spondylosis without myelopathy or radiculopathy, lumbar region: Secondary | ICD-10-CM | POA: Diagnosis not present

## 2022-06-13 DIAGNOSIS — M5030 Other cervical disc degeneration, unspecified cervical region: Secondary | ICD-10-CM | POA: Diagnosis not present

## 2022-06-13 DIAGNOSIS — Z79891 Long term (current) use of opiate analgesic: Secondary | ICD-10-CM | POA: Diagnosis not present

## 2022-06-13 DIAGNOSIS — M542 Cervicalgia: Secondary | ICD-10-CM | POA: Diagnosis not present

## 2022-06-13 NOTE — Telephone Encounter (Signed)
Attempted to call patient x2. VM left both times. Pt to call back to have her PV reschedule by 5 pm. Made pt aware thru VM that if we do not hear back from her by 5 PM we would have to cancel her PV and colonoscopy.

## 2022-06-13 NOTE — Telephone Encounter (Signed)
Attempted to return pt call no answer unable to leave VM. She has already missed her PV time. She will need to be rescheduled when and if she calls back. Thank you .

## 2022-06-13 NOTE — Telephone Encounter (Signed)
Inbound call from pt, she is returning the call, she missed a pv appt.. She said if you can call her at [phone].Thanks

## 2022-06-13 NOTE — Telephone Encounter (Signed)
Patient was rescheduled for 2/19 at 10:00 for PV

## 2022-06-17 ENCOUNTER — Telehealth: Payer: Self-pay

## 2022-06-17 NOTE — Telephone Encounter (Signed)
Call to pt for previsit x2, no answer, left vm for pt to call and r/s previsit by 5 pm today or we will cx her upcoming colonoscopy and then she will need to r/s both appts.

## 2022-06-18 DIAGNOSIS — Z79891 Long term (current) use of opiate analgesic: Secondary | ICD-10-CM | POA: Diagnosis not present

## 2022-06-18 DIAGNOSIS — E1142 Type 2 diabetes mellitus with diabetic polyneuropathy: Secondary | ICD-10-CM | POA: Diagnosis not present

## 2022-06-18 DIAGNOSIS — M25512 Pain in left shoulder: Secondary | ICD-10-CM | POA: Diagnosis not present

## 2022-06-18 DIAGNOSIS — M19012 Primary osteoarthritis, left shoulder: Secondary | ICD-10-CM | POA: Diagnosis not present

## 2022-06-18 DIAGNOSIS — G894 Chronic pain syndrome: Secondary | ICD-10-CM | POA: Diagnosis not present

## 2022-06-18 DIAGNOSIS — M961 Postlaminectomy syndrome, not elsewhere classified: Secondary | ICD-10-CM | POA: Diagnosis not present

## 2022-06-18 DIAGNOSIS — Z1389 Encounter for screening for other disorder: Secondary | ICD-10-CM | POA: Diagnosis not present

## 2022-06-18 DIAGNOSIS — M5030 Other cervical disc degeneration, unspecified cervical region: Secondary | ICD-10-CM | POA: Diagnosis not present

## 2022-06-18 DIAGNOSIS — M47816 Spondylosis without myelopathy or radiculopathy, lumbar region: Secondary | ICD-10-CM | POA: Diagnosis not present

## 2022-06-18 DIAGNOSIS — E1140 Type 2 diabetes mellitus with diabetic neuropathy, unspecified: Secondary | ICD-10-CM | POA: Diagnosis not present

## 2022-06-18 DIAGNOSIS — M542 Cervicalgia: Secondary | ICD-10-CM | POA: Diagnosis not present

## 2022-07-04 DIAGNOSIS — M5442 Lumbago with sciatica, left side: Secondary | ICD-10-CM | POA: Diagnosis not present

## 2022-07-04 DIAGNOSIS — H9313 Tinnitus, bilateral: Secondary | ICD-10-CM | POA: Diagnosis not present

## 2022-07-04 DIAGNOSIS — G8929 Other chronic pain: Secondary | ICD-10-CM | POA: Diagnosis not present

## 2022-07-04 DIAGNOSIS — M5441 Lumbago with sciatica, right side: Secondary | ICD-10-CM | POA: Diagnosis not present

## 2022-07-04 DIAGNOSIS — Z0289 Encounter for other administrative examinations: Secondary | ICD-10-CM | POA: Diagnosis not present

## 2022-07-04 DIAGNOSIS — E1159 Type 2 diabetes mellitus with other circulatory complications: Secondary | ICD-10-CM | POA: Diagnosis not present

## 2022-07-04 DIAGNOSIS — E1142 Type 2 diabetes mellitus with diabetic polyneuropathy: Secondary | ICD-10-CM | POA: Diagnosis not present

## 2022-07-04 DIAGNOSIS — I10 Essential (primary) hypertension: Secondary | ICD-10-CM | POA: Diagnosis not present

## 2022-07-08 DIAGNOSIS — M75121 Complete rotator cuff tear or rupture of right shoulder, not specified as traumatic: Secondary | ICD-10-CM | POA: Diagnosis not present

## 2022-07-08 DIAGNOSIS — M4692 Unspecified inflammatory spondylopathy, cervical region: Secondary | ICD-10-CM | POA: Diagnosis not present

## 2022-07-08 DIAGNOSIS — M24812 Other specific joint derangements of left shoulder, not elsewhere classified: Secondary | ICD-10-CM | POA: Diagnosis not present

## 2022-07-09 ENCOUNTER — Encounter: Payer: Medicare HMO | Admitting: Gastroenterology

## 2022-07-15 DIAGNOSIS — M542 Cervicalgia: Secondary | ICD-10-CM | POA: Diagnosis not present

## 2022-07-15 DIAGNOSIS — M961 Postlaminectomy syndrome, not elsewhere classified: Secondary | ICD-10-CM | POA: Diagnosis not present

## 2022-07-15 DIAGNOSIS — M19012 Primary osteoarthritis, left shoulder: Secondary | ICD-10-CM | POA: Diagnosis not present

## 2022-07-15 DIAGNOSIS — E1140 Type 2 diabetes mellitus with diabetic neuropathy, unspecified: Secondary | ICD-10-CM | POA: Diagnosis not present

## 2022-07-15 DIAGNOSIS — G894 Chronic pain syndrome: Secondary | ICD-10-CM | POA: Diagnosis not present

## 2022-07-15 DIAGNOSIS — Z79891 Long term (current) use of opiate analgesic: Secondary | ICD-10-CM | POA: Diagnosis not present

## 2022-07-15 DIAGNOSIS — M5030 Other cervical disc degeneration, unspecified cervical region: Secondary | ICD-10-CM | POA: Diagnosis not present

## 2022-07-15 DIAGNOSIS — M47816 Spondylosis without myelopathy or radiculopathy, lumbar region: Secondary | ICD-10-CM | POA: Diagnosis not present

## 2022-07-15 DIAGNOSIS — E1142 Type 2 diabetes mellitus with diabetic polyneuropathy: Secondary | ICD-10-CM | POA: Diagnosis not present

## 2022-07-15 DIAGNOSIS — M25512 Pain in left shoulder: Secondary | ICD-10-CM | POA: Diagnosis not present

## 2022-07-15 DIAGNOSIS — Z1389 Encounter for screening for other disorder: Secondary | ICD-10-CM | POA: Diagnosis not present

## 2022-07-31 DIAGNOSIS — E1142 Type 2 diabetes mellitus with diabetic polyneuropathy: Secondary | ICD-10-CM | POA: Diagnosis not present

## 2022-07-31 DIAGNOSIS — Z0289 Encounter for other administrative examinations: Secondary | ICD-10-CM | POA: Diagnosis not present

## 2022-07-31 DIAGNOSIS — E1165 Type 2 diabetes mellitus with hyperglycemia: Secondary | ICD-10-CM | POA: Diagnosis not present

## 2022-07-31 DIAGNOSIS — E1159 Type 2 diabetes mellitus with other circulatory complications: Secondary | ICD-10-CM | POA: Diagnosis not present

## 2022-07-31 DIAGNOSIS — I10 Essential (primary) hypertension: Secondary | ICD-10-CM | POA: Diagnosis not present

## 2022-07-31 DIAGNOSIS — R1011 Right upper quadrant pain: Secondary | ICD-10-CM | POA: Diagnosis not present

## 2022-08-02 ENCOUNTER — Other Ambulatory Visit: Payer: Self-pay | Admitting: Family Medicine

## 2022-08-02 DIAGNOSIS — R1011 Right upper quadrant pain: Secondary | ICD-10-CM

## 2022-08-07 DIAGNOSIS — E785 Hyperlipidemia, unspecified: Secondary | ICD-10-CM | POA: Diagnosis not present

## 2022-08-07 DIAGNOSIS — E1169 Type 2 diabetes mellitus with other specified complication: Secondary | ICD-10-CM | POA: Diagnosis not present

## 2022-08-07 DIAGNOSIS — E1143 Type 2 diabetes mellitus with diabetic autonomic (poly)neuropathy: Secondary | ICD-10-CM | POA: Diagnosis not present

## 2022-08-07 DIAGNOSIS — Z0289 Encounter for other administrative examinations: Secondary | ICD-10-CM | POA: Diagnosis not present

## 2022-08-07 DIAGNOSIS — E1159 Type 2 diabetes mellitus with other circulatory complications: Secondary | ICD-10-CM | POA: Diagnosis not present

## 2022-08-07 DIAGNOSIS — I10 Essential (primary) hypertension: Secondary | ICD-10-CM | POA: Diagnosis not present

## 2022-08-12 DIAGNOSIS — M19012 Primary osteoarthritis, left shoulder: Secondary | ICD-10-CM | POA: Diagnosis not present

## 2022-08-12 DIAGNOSIS — M47816 Spondylosis without myelopathy or radiculopathy, lumbar region: Secondary | ICD-10-CM | POA: Diagnosis not present

## 2022-08-12 DIAGNOSIS — M25512 Pain in left shoulder: Secondary | ICD-10-CM | POA: Diagnosis not present

## 2022-08-12 DIAGNOSIS — Z79891 Long term (current) use of opiate analgesic: Secondary | ICD-10-CM | POA: Diagnosis not present

## 2022-08-12 DIAGNOSIS — M5030 Other cervical disc degeneration, unspecified cervical region: Secondary | ICD-10-CM | POA: Diagnosis not present

## 2022-08-12 DIAGNOSIS — Z1389 Encounter for screening for other disorder: Secondary | ICD-10-CM | POA: Diagnosis not present

## 2022-08-12 DIAGNOSIS — G894 Chronic pain syndrome: Secondary | ICD-10-CM | POA: Diagnosis not present

## 2022-08-12 DIAGNOSIS — E1142 Type 2 diabetes mellitus with diabetic polyneuropathy: Secondary | ICD-10-CM | POA: Diagnosis not present

## 2022-08-12 DIAGNOSIS — M542 Cervicalgia: Secondary | ICD-10-CM | POA: Diagnosis not present

## 2022-08-20 ENCOUNTER — Telehealth: Payer: Self-pay | Admitting: *Deleted

## 2022-08-20 NOTE — Telephone Encounter (Signed)
Patient rescheduled for pv with a updated number to be reached in her patient notes.

## 2022-08-20 NOTE — Telephone Encounter (Signed)
Attempt to reach pt for pre-visit. LM, Gave # for pt to call back. Will attempt to reach pt again within appt time. 2nd attempt to reach pt for pre-visit appt. LM with # for pt and instructed her to call # by end of day to reschedule pre-visit or per protocol her procedure will be canceled.

## 2022-08-26 ENCOUNTER — Other Ambulatory Visit: Payer: Medicare HMO

## 2022-08-27 ENCOUNTER — Telehealth: Payer: Self-pay | Admitting: *Deleted

## 2022-08-27 NOTE — Telephone Encounter (Signed)
Attempt to reach pt for pre-visit. LM with call back #  Second attempt to reach pt for pre-vist unsuccessful. LM with facility # for pt to call back. Instructed pt to call # given by end of the day and reschedule the pre-visit or the scheduled procedure will be canceled.  RN will verify if pt has rescheduled pre-visit or not at end of day and if no per protocol Procedure will be canceled,

## 2022-09-03 ENCOUNTER — Encounter: Payer: Medicare HMO | Admitting: Gastroenterology

## 2022-09-04 DIAGNOSIS — R1011 Right upper quadrant pain: Secondary | ICD-10-CM | POA: Diagnosis not present

## 2022-09-04 DIAGNOSIS — E1159 Type 2 diabetes mellitus with other circulatory complications: Secondary | ICD-10-CM | POA: Diagnosis not present

## 2022-09-04 DIAGNOSIS — E785 Hyperlipidemia, unspecified: Secondary | ICD-10-CM | POA: Diagnosis not present

## 2022-09-04 DIAGNOSIS — E1169 Type 2 diabetes mellitus with other specified complication: Secondary | ICD-10-CM | POA: Diagnosis not present

## 2022-09-04 DIAGNOSIS — Z0289 Encounter for other administrative examinations: Secondary | ICD-10-CM | POA: Diagnosis not present

## 2022-09-04 DIAGNOSIS — Z1211 Encounter for screening for malignant neoplasm of colon: Secondary | ICD-10-CM | POA: Diagnosis not present

## 2022-09-04 DIAGNOSIS — I10 Essential (primary) hypertension: Secondary | ICD-10-CM | POA: Diagnosis not present

## 2022-09-09 DIAGNOSIS — Z79891 Long term (current) use of opiate analgesic: Secondary | ICD-10-CM | POA: Diagnosis not present

## 2022-09-09 DIAGNOSIS — M5030 Other cervical disc degeneration, unspecified cervical region: Secondary | ICD-10-CM | POA: Diagnosis not present

## 2022-09-09 DIAGNOSIS — M19012 Primary osteoarthritis, left shoulder: Secondary | ICD-10-CM | POA: Diagnosis not present

## 2022-09-09 DIAGNOSIS — M47816 Spondylosis without myelopathy or radiculopathy, lumbar region: Secondary | ICD-10-CM | POA: Diagnosis not present

## 2022-09-09 DIAGNOSIS — E1142 Type 2 diabetes mellitus with diabetic polyneuropathy: Secondary | ICD-10-CM | POA: Diagnosis not present

## 2022-09-09 DIAGNOSIS — M25512 Pain in left shoulder: Secondary | ICD-10-CM | POA: Diagnosis not present

## 2022-09-09 DIAGNOSIS — G894 Chronic pain syndrome: Secondary | ICD-10-CM | POA: Diagnosis not present

## 2022-09-09 DIAGNOSIS — M542 Cervicalgia: Secondary | ICD-10-CM | POA: Diagnosis not present

## 2022-10-03 ENCOUNTER — Other Ambulatory Visit: Payer: Self-pay | Admitting: Family Medicine

## 2022-10-03 ENCOUNTER — Ambulatory Visit
Admission: RE | Admit: 2022-10-03 | Discharge: 2022-10-03 | Disposition: A | Discharge status: Home / Self Care | Payer: Medicare HMO | Source: Ambulatory Visit | Attending: Family Medicine | Admitting: Family Medicine

## 2022-10-03 DIAGNOSIS — M542 Cervicalgia: Secondary | ICD-10-CM

## 2022-10-09 ENCOUNTER — Ambulatory Visit
Admission: RE | Admit: 2022-10-09 | Discharge: 2022-10-09 | Disposition: A | Discharge status: Home / Self Care | Payer: Medicare HMO | Source: Ambulatory Visit | Attending: Family Medicine | Admitting: Family Medicine

## 2022-10-09 ENCOUNTER — Other Ambulatory Visit: Payer: Medicare HMO

## 2022-10-09 DIAGNOSIS — R1011 Right upper quadrant pain: Secondary | ICD-10-CM

## 2023-01-24 ENCOUNTER — Encounter: Payer: Self-pay | Admitting: Family Medicine

## 2023-07-18 ENCOUNTER — Ambulatory Visit
Admission: RE | Admit: 2023-07-18 | Discharge: 2023-07-18 | Disposition: A | Discharge status: Home / Self Care | Source: Ambulatory Visit | Attending: Family Medicine | Admitting: Family Medicine

## 2023-07-18 ENCOUNTER — Other Ambulatory Visit: Payer: Self-pay | Admitting: Family Medicine

## 2023-07-18 DIAGNOSIS — M1990 Unspecified osteoarthritis, unspecified site: Secondary | ICD-10-CM

## 2023-07-24 ENCOUNTER — Telehealth: Payer: Self-pay | Admitting: Internal Medicine

## 2023-07-24 NOTE — Telephone Encounter (Signed)
 Patient dropped off form for medical report. Put in Dr. Roxy Cedar box. Patient phone number is [phone].

## 2023-07-24 NOTE — Telephone Encounter (Signed)
 Received form.  Put on Dr. Roxy Cedar work station in C POD.

## 2023-07-30 NOTE — Telephone Encounter (Signed)
 Called patient to inform that DMV forms are ready for pick up.  Copies made for scan and a copy made and placed in Dr. Sinclair Ship cabinet in C POD.

## 2023-07-30 NOTE — Telephone Encounter (Signed)
 Checked with Dr. Maple Hudson.  Dr. Maple Hudson still has form from [facility] to fill out for patient regarding health diagnosis.

## 2023-09-28 NOTE — Progress Notes (Signed)
 HPI F smoker followed for OSA, complicated by Seasonal Allergy, Bipolar, Low Back Pain/ Lumbar Disc disease, DM2/ neuropathy, Hy[perlipidemeia, Tobacco use, Aortic Atherosclerosis, NPSG 06/22/13 AHI 23.4/ hr, desaturation to 82%, body weight 226 lbs  --------------------------------------------------------------------------------------------   02/05/22-  70yoF Smoker (50+ pk yrs) followed for OSA, complicated by Seasonal Allergy, Bipolar 1, Low Back Pain/ Lumbar Disc disease, DM2/ neuropathy, Hyperlipidemia, Tobacco use, Obesity, Aortic Atherosclerosis,  Referred to Dr Micky for oral appliance - went back to CPAP -Ventolin  hfa, Nicoderm,  CPAP 9/ [employer] Download compliance- NOT USING Body weight today Covid vax- Flu vax- Saw Cardiology for DOE and CP----?? Need PFT?? Late arrival- worked in. Complains of easy fatigue- symptom overlap tiredness with dyspnea/ depression. 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 Discussed role of her meds. Not using CPAP consistently because mask uncomfortable. Trying to use FFM but says nasal pillows worked best in past. No cough or wheeze. Tobacco user- encouraged to stop.   09/29/23-  72yoF Smoker (50+ pk yrs) followed for OSA, complicated by Seasonal Allergy, Bipolar 1, Low Back Pain/ Lumbar Disc disease, DM2/ neuropathy, Hyperlipidemia, Tobacco use, Obesity, Aortic Atherosclerosis,  Referred to Dr Micky for oral appliance - went back to CPAP -Ventolin  hfa, Nicoderm,  CPAP 9/ [employer] Download compliance-  Body weight today 212 lbs -----Current full face mask not comfortable.  Patient taking mask off face during sleep.  Would like to go back to the nasal pillow type mask. Does rest better with CPAP. Rarely needs to nap. Bedtie 1-2AM. Morning  coffee.  We can replace old CPAP, changing to auto 5-15.  ROS-see HPI   + = positive Constitutional:    weight loss, night sweats, fevers, chills, +fatigue, lassitude. HEENT:    headaches, difficulty  swallowing, tooth/dental problems, sore throat,       sneezing, itching, ear ache, nasal congestion, post nasal drip, snoring CV:    chest pain, orthopnea, PND, swelling in lower extremities, anasarca,                                   dizziness, palpitations Resp:   +shortness of breath with exertion or at rest.                productive cough,   non-productive cough, coughing up of blood.              change in color of mucus.  wheezing.   Skin:    rash or lesions. GI:  No-   heartburn, indigestion, abdominal pain, nausea, vomiting, diarrhea,                 change in bowel habits, loss of appetite GU: dysuria, change in color of urine, no urgency or frequency.   flank pain. MS:   joint pain, stiffness, decreased range of motion, back pain. Neuro-     nothing unusual Psych:  change in mood or affect.  depression or anxiety.   memory loss.  OBJ- Physical Exam General- Alert, Oriented, Affect-appropriate, Distress- none acute, + obese Skin- rash-none, lesions- none, excoriation- none Lymphadenopathy- none Head- atraumatic            Eyes- Gross vision intact, PERRLA, conjunctivae and secretions clear            Ears- Hearing, canals-normal            Nose- Clear, no-Septal dev, mucus, polyps, erosion, perforation  Throat- Mallampati III-IV , mucosa clear , drainage- none, tonsils- atrophic, + missing teeth Neck- flexible , trachea midline, no stridor , thyroid  nl, carotid no bruit Chest - symmetrical excursion , unlabored           Heart/CV- RRR , no murmur , no gallop  , no rub, nl s1 s2                           - JVD- none , edema- none, stasis changes- none, varices- none           Lung- clear to P&A, wheeze- none, cough- none , dullness-none, rub- none           Chest wall-  Abd-  Br/ Gen/ Rectal- Not done, not indicated Extrem- cyanosis- none, clubbing, none, atrophy- none, strength- nl Neuro- +resting tremor hands

## 2023-09-29 ENCOUNTER — Encounter: Payer: Self-pay | Admitting: Internal Medicine

## 2023-09-29 ENCOUNTER — Ambulatory Visit: Admitting: Internal Medicine

## 2023-09-29 VITALS — BP 114/68 | HR 82 | Temp 97.9°F | Ht 63.0 in | Wt 212.2 lb

## 2023-09-29 DIAGNOSIS — G4733 Obstructive sleep apnea (adult) (pediatric): Secondary | ICD-10-CM | POA: Diagnosis not present

## 2023-09-29 DIAGNOSIS — Z720 Tobacco use: Secondary | ICD-10-CM | POA: Diagnosis not present

## 2023-09-29 DIAGNOSIS — H9313 Tinnitus, bilateral: Secondary | ICD-10-CM | POA: Diagnosis not present

## 2023-09-29 NOTE — Patient Instructions (Addendum)
 Order- DME [employer]- please replace old CPAP, mask of choice- patient prefers nasal pillows. Change settings to autopap 5-15, heated humidity, supplies, AirView/ card  Referral to [facility]    dx tinnitus, bilateral

## 2023-10-22 ENCOUNTER — Encounter (INDEPENDENT_AMBULATORY_CARE_PROVIDER_SITE_OTHER): Payer: Self-pay

## 2023-11-02 ENCOUNTER — Encounter: Payer: Self-pay | Admitting: Internal Medicine

## 2023-11-02 NOTE — Assessment & Plan Note (Signed)
 Emphasis on long-term abstinence.

## 2023-11-02 NOTE — Assessment & Plan Note (Signed)
 Benefits from CPAP Order- replace old CPAP, changing to auto 5-15.

## 2023-11-25 NOTE — Procedures (Signed)
Mask fit

## 2023-12-09 NOTE — Progress Notes (Deleted)
  [address] [place], [place] [address] [phone]  Audiological Evaluation    Name: Melanie Reynolds     DOB:   1950/07/24      MRN:   [id number]                                                                                     Service Date: 12/09/2023     Accompanied by: ***   Patient comes today after Reyes Cohen, PA-C sent a referral for a hearing evaluation due to concerns with tinnitus.   Symptoms Yes Details  Hearing loss  []    Tinnitus  []    Ear pain/ infections/pressure  []    Balance problems  []    Noise exposure history  []    Previous ear surgeries  []    Family history of hearing loss  []    Amplification  []    Other  []      Otoscopy: Right ear: {otoscopy:31227} Left ear:  {otoscopy:31227}  Tympanometry: Right ear: {tympanometry results:31367}. Left ear: {tympanometry results:31367}.    Pure tone Audiometry: Right ear- *** {hearing loss types:31372::sensorineural hearing loss} from *** Hz - *** Hz. Left ear-  *** {hearing loss types:31372::sensorineural hearing loss} from *** Hz - *** Hz.  Speech Audiometry: Right ear- Speech Reception Threshold (SRT) was obtained at *** dBHL. Left ear-Speech Reception Threshold (SRT) was obtained at *** dBHL.   Word Recognition Score Tested using NU-6 (recorded) Right ear: ***% was obtained at a presentation level of *** dBHL with contralateral masking which is deemed as  {word recognition score:31373}. Left ear: ***% was obtained at a presentation level of *** dBHL with contralateral masking which is deemed as  {word recognition score:31373}.   The hearing test results were completed {transducer options:31388} and results are deemed to be of {test reliability:31390::good reliability}. Test technique:  conventional      Recommendations: Follow up with ENT as scheduled for today. Return for a hearing evaluation if concerns with hearing changes arise or per MD recommendation.   Cadence Minton MARIE  LEROUX-MARTINEZ, AUD

## 2023-12-10 ENCOUNTER — Institutional Professional Consult (permissible substitution) (INDEPENDENT_AMBULATORY_CARE_PROVIDER_SITE_OTHER): Admitting: Physician Assistant

## 2023-12-10 ENCOUNTER — Ambulatory Visit (INDEPENDENT_AMBULATORY_CARE_PROVIDER_SITE_OTHER): Admitting: Audiology

## 2024-01-16 ENCOUNTER — Other Ambulatory Visit: Payer: Self-pay

## 2024-01-16 ENCOUNTER — Encounter: Payer: Self-pay | Admitting: Orthopedic Surgery

## 2024-01-16 ENCOUNTER — Ambulatory Visit (INDEPENDENT_AMBULATORY_CARE_PROVIDER_SITE_OTHER): Admitting: Orthopedic Surgery

## 2024-01-16 DIAGNOSIS — M542 Cervicalgia: Secondary | ICD-10-CM

## 2024-01-16 DIAGNOSIS — M25512 Pain in left shoulder: Secondary | ICD-10-CM

## 2024-01-16 DIAGNOSIS — M25511 Pain in right shoulder: Secondary | ICD-10-CM | POA: Diagnosis not present
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
## 2024-02-18 ENCOUNTER — Other Ambulatory Visit: Payer: Self-pay

## 2024-02-18 ENCOUNTER — Emergency Department (HOSPITAL_COMMUNITY)
Admission: EM | Admit: 2024-02-18 | Discharge: 2024-02-18 | Disposition: A | Discharge status: Home / Self Care | Attending: Emergency Medicine | Admitting: Emergency Medicine

## 2024-02-18 ENCOUNTER — Encounter (HOSPITAL_COMMUNITY): Payer: Self-pay

## 2024-02-18 DIAGNOSIS — Z91040 Latex allergy status: Secondary | ICD-10-CM | POA: Diagnosis not present

## 2024-02-18 DIAGNOSIS — R21 Rash and other nonspecific skin eruption: Secondary | ICD-10-CM | POA: Diagnosis present

## 2024-02-18 MED ORDER — VALACYCLOVIR HCL 500 MG PO TABS
1000.0000 mg | ORAL_TABLET | Freq: Once | ORAL | Status: AC
  Administered 2024-02-18: 1000 mg via ORAL
  Filled 2024-02-18: qty 2

## 2024-02-18 MED ORDER — CEPHALEXIN 500 MG PO CAPS: 500.0000 mg | ORAL_CAPSULE | Freq: Four times a day (QID) | ORAL | 0 refills | Status: AC

## 2024-02-18 MED ORDER — CEPHALEXIN 250 MG PO CAPS
500.0000 mg | ORAL_CAPSULE | Freq: Once | ORAL | Status: AC
  Administered 2024-02-18: 500 mg via ORAL
  Filled 2024-02-18: qty 2

## 2024-02-18 MED ORDER — VALACYCLOVIR HCL 1 G PO TABS: 1000.0000 mg | ORAL_TABLET | Freq: Three times a day (TID) | ORAL | 0 refills | Status: AC

## 2024-02-18 NOTE — ED Provider Notes (Signed)
 [place] [facility] AT [facility] Provider Note   CSN: [id number] Arrival date & time: 02/18/24  9782     Patient presents with: Insect Bite   Melanie Reynolds is a 73 y.o. female.   73 year old female presents with concern for insect bite to the left side of her scalp onset 2 days ago.  She states that she pulled a small black bug out of her hair, since then has had spread of the bumps to her whole body which are painful in nature.  She denies any fevers or sick contacts.  No other complaints or concerns today.       Prior to Admission medications   Medication Sig Start Date End Date Taking? Authorizing Provider  cephALEXin (KEFLEX) 500 MG capsule Take 1 capsule (500 mg total) by mouth 4 (four) times daily for 7 days. 02/18/24 02/25/24 Yes Beverley Leita LABOR, PA-C  nicotine  (NICODERM CQ  - DOSED IN MG/24 HOURS) 21 mg/24hr patch Place 1 patch (21 mg total) onto the skin daily. Patient not taking: Reported on 09/29/2023 08/15/21   Neysa Reggy BIRCH, MD  valACYclovir (VALTREX) 1000 MG tablet Take 1 tablet (1,000 mg total) by mouth 3 (three) times daily for 7 days. 02/18/24 02/25/24 Yes Beverley Leita LABOR, PA-C  ACCU-CHEK AVIVA PLUS test strip daily. for testing 11/27/17   [provider]  ACCU-CHEK SOFTCLIX LANCETS lancets daily. for testing 12/01/17   [provider]  albuterol  (VENTOLIN  HFA) 108 (90 Base) MCG/ACT inhaler Inhale 2 puffs into the lungs every 6 (six) hours as needed for wheezing.  07/11/15   [provider]  amitriptyline (ELAVIL) 10 MG tablet Take 10 mg by mouth at bedtime. 02/27/22   [provider]  diclofenac sodium (VOLTAREN) 1 % GEL Apply 4 g topically 4 (four) times daily as needed (for pain).  10/02/17   [provider]  fluticasone  (FLONASE ) 50 MCG/ACT nasal spray Place 2 sprays into both nostrils daily. 06/03/13   Regalado, Belkys A, MD  furosemide  (LASIX ) 40 MG tablet Take 40 mg by mouth daily.    [provider]  glipiZIDE (GLUCOTROL) 5 MG tablet Take 5 mg by mouth 2 (two) times daily. 11/05/21   [provider]  lithium  carbonate 300 MG capsule Take 300 mg by mouth daily.    [provider]  LYRICA  150 MG capsule Take 150 mg by mouth 3 (three) times daily. 10/02/17   [provider]  NARCAN 4 MG/0.1ML LIQD nasal spray kit Place 1 spray into the nose once. 08/10/19   [provider]  NON FORMULARY Place 1 Dose under the tongue 2 (two) times daily. CBD liquid Patient not taking: Reported on 09/29/2023    [provider]  NON FORMULARY Apply 1 application topically daily as needed (pain). CBD topical balm Patient not taking: Reported on 01/23/2022    [provider]  oxyCODONE -acetaminophen  (PERCOCET) 10-325 MG tablet Take 1 tablet by mouth every 4 (four) hours as needed for pain. 06/14/21   [place], Hospital doctor, PA-C  tirzepatide [facility]) 5 MG/0.5ML Pen Inject 5 mg into the skin once a week.    [provider]  topiramate  (TOPAMAX ) 50 MG tablet Take 50 mg by mouth daily.    [provider]  Vitamin D, Ergocalciferol, (DRISDOL) 1.25 MG (50000 UNIT) CAPS capsule Take 50,000 Units by mouth once a week. 05/01/21   [provider]    Allergies: Morphine  and codeine, Latex, and Naproxen    Review of Systems  Negative except as per HPI Updated Vital Signs BP 106/65 (BP Location: Left Arm)   Pulse 69   Temp (!) 97 F (36.1 C) (Oral)   Resp 18   Ht 5' 3 (1.6 m)   Wt 95.3 kg   SpO2 100%   BMI 37.20 kg/m   Physical Exam Vitals and nursing note reviewed.  Constitutional:      General: She is not in acute distress.    Appearance: She is well-developed. She is not diaphoretic.  HENT:     Head: Normocephalic and atraumatic.  Pulmonary:     Effort: Pulmonary effort is normal.  Skin:    General: Skin is warm and dry.     Findings: Rash present.     Comments: Pustular lesion noted to left scalp with surrounding  clustered lesions.  Also slightly red raised areas to left cheek, no lesions noted to tip of nose.  Also scattered red raised lesions, sparse to extremities and trunk.  Neurological:     Mental Status: She is alert and oriented to person, place, and time.  Psychiatric:        Behavior: Behavior normal.     (all labs ordered are listed, but only abnormal results are displayed) Labs Reviewed - No data to display  EKG: None  Radiology: No results found.   Procedures   Medications Ordered in the ED  valACYclovir (VALTREX) tablet 1,000 mg (has no administration in time range)  cephALEXin (KEFLEX) capsule 500 mg (has no administration in time range)                                    Medical Decision Making Risk Prescription drug management.   73 year old female presents with concern for a rash.  She states that she felt something bite her left scalp 2 days ago.  She touched the area and pulled out a small black bug.  Since that time she has had several lesions pop up.  Initially thought area may be related to shingles given cluster of vesicles/pustules to left scalp area with lesions only to left cheek area however on further examined patient's body with a few lesions noted to her trunk and lower extremities, question reaction versus infective process.  Provided with antibiotics and Valtrex.  Recommend patient follow-up with her PCP in 2 days.  Return to ER as needed.     Final diagnoses:  Rash    ED Discharge Orders          Ordered    valACYclovir (VALTREX) 1000 MG tablet  3 times daily        02/18/24 0614    cephALEXin (KEFLEX) 500 MG capsule  4 times daily        02/18/24 [id number]               Beverley Leita LABOR, PA-C 02/18/24 [id number]    Griselda Norris, MD 02/18/24 2244

## 2024-02-18 NOTE — Discharge Instructions (Signed)
 Take Valtrex and Keflex as prescribed. Please recheck with your doctor in 2 days.

## 2024-02-18 NOTE — ED Triage Notes (Signed)
 Suspected insect bite to left side of head.   Since then has had multiple red spots across her body worse on chest, back, and genital area.

## 2024-03-01 ENCOUNTER — Encounter: Payer: Self-pay | Admitting: Radiology

## 2024-03-04 ENCOUNTER — Encounter: Admitting: Physician Assistant
